# Patient Record
Sex: Female | Born: 1965 | Race: Black or African American | Hispanic: No | Marital: Single | Smoking: Never smoker
Health system: Southern US, Community
[De-identification: ages and names within clinical notes are randomized; demographics above are authoritative.]

## PROBLEM LIST (undated history)

## (undated) DIAGNOSIS — M109 Gout, unspecified: Secondary | ICD-10-CM

## (undated) DIAGNOSIS — E119 Type 2 diabetes mellitus without complications: Secondary | ICD-10-CM

## (undated) DIAGNOSIS — M1990 Unspecified osteoarthritis, unspecified site: Secondary | ICD-10-CM

## (undated) DIAGNOSIS — R002 Palpitations: Secondary | ICD-10-CM

## (undated) DIAGNOSIS — R600 Localized edema: Secondary | ICD-10-CM

## (undated) DIAGNOSIS — M47815 Spondylosis without myelopathy or radiculopathy, thoracolumbar region: Secondary | ICD-10-CM

## (undated) DIAGNOSIS — M479 Spondylosis, unspecified: Secondary | ICD-10-CM

## (undated) DIAGNOSIS — R0602 Shortness of breath: Secondary | ICD-10-CM

## (undated) DIAGNOSIS — E079 Disorder of thyroid, unspecified: Secondary | ICD-10-CM

## (undated) HISTORY — PX: OVARIAN CYST SURGERY: SHX726

## (undated) HISTORY — DX: Palpitations: R00.2

## (undated) HISTORY — PX: UPPER GASTROINTESTINAL ENDOSCOPY: SHX188

## (undated) HISTORY — DX: Unspecified osteoarthritis, unspecified site: M19.90

## (undated) HISTORY — DX: Gout, unspecified: M10.9

## (undated) HISTORY — DX: Shortness of breath: R06.02

## (undated) HISTORY — DX: Spondylosis without myelopathy or radiculopathy, thoracolumbar region: M47.815

## (undated) HISTORY — PX: TONSILECTOMY, ADENOIDECTOMY, BILATERAL MYRINGOTOMY AND TUBES: SHX2538

## (undated) HISTORY — PX: COLONOSCOPY: SHX174

## (undated) HISTORY — PX: TUBAL LIGATION: SHX77

## (undated) HISTORY — DX: Disorder of thyroid, unspecified: E07.9

## (undated) HISTORY — DX: Localized edema: R60.0

## (undated) HISTORY — PX: THYROID SURGERY: SHX805

## (undated) HISTORY — DX: Spondylosis, unspecified: M47.9

## (undated) HISTORY — PX: GALLBLADDER SURGERY: SHX652

## (undated) HISTORY — DX: Type 2 diabetes mellitus without complications: E11.9

---

## 2015-09-17 ENCOUNTER — Encounter: Payer: Self-pay | Admitting: Endocrinology

## 2015-09-17 ENCOUNTER — Ambulatory Visit (INDEPENDENT_AMBULATORY_CARE_PROVIDER_SITE_OTHER): Payer: 59 | Admitting: Endocrinology

## 2015-09-17 VITALS — BP 100/84 | HR 86 | Ht 66.0 in | Wt 271.4 lb

## 2015-09-17 DIAGNOSIS — M1990 Unspecified osteoarthritis, unspecified site: Secondary | ICD-10-CM

## 2015-09-17 DIAGNOSIS — M47819 Spondylosis without myelopathy or radiculopathy, site unspecified: Secondary | ICD-10-CM | POA: Diagnosis not present

## 2015-09-17 DIAGNOSIS — E119 Type 2 diabetes mellitus without complications: Secondary | ICD-10-CM

## 2015-09-17 DIAGNOSIS — E079 Disorder of thyroid, unspecified: Secondary | ICD-10-CM | POA: Diagnosis not present

## 2015-09-17 MED ORDER — METFORMIN HCL ER 500 MG PO TB24: 2000.0000 mg | ORAL_TABLET | Freq: Every day | ORAL | Status: DC

## 2015-09-17 NOTE — Progress Notes (Signed)
Subjective:    Patient ID: Jean HeightLetitia Berry, female    DOB: 01/09/1966, 50 y.o.   MRN: [id number]  HPI pt states DM was dx'ed in 2014; she has mild if any neuropathy of the lower extremities; she is unaware of any associated chronic complications; she has never been on insulin; pt says her diet and exercise are "average;" she has never had GDM, pancreatitis, severe hypoglycemia or DKA.  She has diarrhea.  She says cbg's are well-controlled.   Past Medical History  Diagnosis Date  . Arthritis   . Diabetes mellitus without complication (HCC)   . Osteoarthritis of back   . Gout flare   . Thyroid disease     left lobect approx 2012--benign per pt    Past Surgical History  Procedure Laterality Date  . Tubal ligation    . Upper gastrointestinal endoscopy    . Thyroid surgery Left   . Gallbladder surgery    . Tonsilectomy, adenoidectomy, bilateral myringotomy and tubes    . Colonoscopy    . Ovarian cyst surgery Right     Social History   Social History  . Marital Status: Divorced    Spouse Name: N/A  . Number of Children: N/A  . Years of Education: N/A   Occupational History  . Not on file.   Social History Main Topics  . Smoking status: Never Smoker   . Smokeless tobacco: Not on file  . Alcohol Use: No  . Drug Use: Not on file  . Sexual Activity: Not on file   Other Topics Concern  . Not on file   Social History Narrative  . No narrative on file    No current outpatient prescriptions on file prior to visit.   No current facility-administered medications on file prior to visit.    Not on File  Family History  Problem Relation Age of Onset  . Lung cancer Mother   . Brain cancer Mother   . Heart disease Mother   . Diabetes Maternal Grandmother     BP 100/84 mmHg  Pulse 86  Ht 5\' 6"  (1.676 m)  Wt 271 lb 6.4 oz (123.106 kg)  BMI 43.83 kg/m2  SpO2 98%  Review of Systems denies blurry vision, headache, chest pain, sob, n/v, urinary frequency, muscle  cramps, excessive diaphoresis, depression, cold intolerance, rhinorrhea, and easy bruising.  She has lost weight, due to her efforts. She has leg cramps.     Objective:   Physical Exam VS: see vs page GEN: no distress HEAD: head: no deformity eyes: no periorbital swelling, no proptosis.  external nose and ears are normal mouth: no lesion seen NECK: a healed scar is present.  i do not appreciate a nodule in the thyroid or elsewhere in the neck.  CHEST WALL: no deformity LUNGS: clear to auscultation CV: reg rate and rhythm, no murmur ABD: abdomen is soft, nontender.  no hepatosplenomegaly.  not distended.  no hernia MUSCULOSKELETAL: muscle bulk and strength are grossly normal.  no obvious joint swelling.  gait is normal and steady EXTEMITIES: no deformity.  no ulcer on the feet.  feet are of normal color and temp.  Trace bilat leg edema.  There is bilateral onychomycosis of the toenails PULSES: dorsalis pedis intact bilat.  no carotid bruit NEURO:  cn 2-12 grossly intact.   readily moves all 4's.  sensation is intact to touch on the feet SKIN:  Normal texture and temperature.  No rash or suspicious lesion is visible.   NODES:  None palpable at the neck PSYCH: alert, well-oriented.  Does not appear anxious nor depressed.  I have reviewed outside records, and summarized: Pt was noted to have well-controlled DM, but she requested a weight management program.  She was also noted to have edema      Assessment & Plan:  Type 2 DM: apparently well-controlled.  We have requested results from [facility].   Obesity: new to me.  Patient is advised the following: Patient Instructions  good diet and exercise significantly improve the control of your diabetes.  please let me know if you wish to be referred to a dietician.  high blood sugar is very risky to your health.  you should see an eye doctor and dentist every year.  It is very important to get all recommended vaccinations.  controlling  your blood pressure and cholesterol drastically reduces the damage diabetes does to your body.  Those who smoke should quit.  please discuss these with your doctor.  check your blood sugar once a day.  vary the time of day when you check, between before the 3 meals, and at bedtime.  also check if you have symptoms of your blood sugar being too high or too low.  please keep a record of the readings and bring it to your next appointment here (or you can bring the meter itself).  You can write it on any piece of paper.  please call us sooner if your blood sugar goes below 70, or if you have a lot of readings over 200.  Please consider having weight loss surgery.  It is good for your health.  Here is some information about it.  If you decide to consider further, please call the phone number in the papers, and register for a free informational meeting i have sent a prescription to your pharmacy, to change the metformin to extended-release.   We'll call you when we get the blood test results (a1c).  If you do not hear from [place] in 2 days, please call back.   Please come back for a follow-up appointment in 3 months.    Romero Belling, MD

## 2015-09-17 NOTE — Patient Instructions (Addendum)
good diet and exercise significantly improve the control of your diabetes.  please let me know if you wish to be referred to a dietician.  high blood sugar is very risky to your health.  you should see an eye doctor and dentist every year.  It is very important to get all recommended vaccinations.  controlling your blood pressure and cholesterol drastically reduces the damage diabetes does to your body.  Those who smoke should quit.  please discuss these with your doctor.  check your blood sugar once a day.  vary the time of day when you check, between before the 3 meals, and at bedtime.  also check if you have symptoms of your blood sugar being too high or too low.  please keep a record of the readings and bring it to your next appointment here (or you can bring the meter itself).  You can write it on any piece of paper.  please call us sooner if your blood sugar goes below 70, or if you have a lot of readings over 200.  Please consider having weight loss surgery.  It is good for your health.  Here is some information about it.  If you decide to consider further, please call the phone number in the papers, and register for a free informational meeting i have sent a prescription to your pharmacy, to change the metformin to extended-release.   We'll call you when we get the blood test results (a1c).  If you do not hear from us in 2 days, please call back.   Please come back for a follow-up appointment in 3 months.

## 2015-09-18 DIAGNOSIS — M47816 Spondylosis without myelopathy or radiculopathy, lumbar region: Secondary | ICD-10-CM | POA: Insufficient documentation

## 2015-09-18 DIAGNOSIS — E119 Type 2 diabetes mellitus without complications: Secondary | ICD-10-CM | POA: Insufficient documentation

## 2015-09-18 DIAGNOSIS — M109 Gout, unspecified: Secondary | ICD-10-CM | POA: Insufficient documentation

## 2015-09-18 DIAGNOSIS — M1990 Unspecified osteoarthritis, unspecified site: Secondary | ICD-10-CM | POA: Insufficient documentation

## 2015-09-30 ENCOUNTER — Telehealth: Payer: Self-pay | Admitting: Family

## 2015-09-30 ENCOUNTER — Telehealth: Payer: Self-pay

## 2015-09-30 NOTE — Telephone Encounter (Signed)
Called patient advising her to take the medication 3 times a day with meals, and try to add one more with snacks so it would not upset her stomach. Patient stated she would try that and see if it helped. No other questions or concerns at this time.

## 2015-09-30 NOTE — Telephone Encounter (Signed)
Could you review the note below during Dr. George HughEllison's absence and please advise? Thanks!

## 2015-09-30 NOTE — Telephone Encounter (Signed)
Patient says when she got her Metformin prescription yesterday it says take all four 500mg  tablets with breakfast, so she is confused because she was taking one 1,000mg  morning and night and she explained to the doctor that it was bothering her stomach. Now she will be taking even more.  Please advise.

## 2015-09-30 NOTE — Telephone Encounter (Signed)
Let's try 500 mg with each of the 3 main meals and maybe afterwards add one more with a snack so that it does not bother her stomach.

## 2015-12-19 ENCOUNTER — Ambulatory Visit (INDEPENDENT_AMBULATORY_CARE_PROVIDER_SITE_OTHER): Payer: 59 | Admitting: Endocrinology

## 2015-12-19 ENCOUNTER — Encounter: Payer: Self-pay | Admitting: Endocrinology

## 2015-12-19 VITALS — BP 122/78 | HR 87 | Ht 66.0 in | Wt 279.0 lb

## 2015-12-19 DIAGNOSIS — E119 Type 2 diabetes mellitus without complications: Secondary | ICD-10-CM | POA: Diagnosis not present

## 2015-12-19 NOTE — Progress Notes (Signed)
   Subjective:    Patient ID: Jean HeightLetitia Berry, female    DOB: [birth year], 50 y.o.   MRN: [id number]  HPI Pt returns for f/u of diabetes mellitus: DM type: 2 Dx'ed: 2014 Complications: none Therapy: metformin GDM: never DKA: never Severe hypoglycemia: never Pancreatitis: never Other: she has never been on insulin Interval history: pt states she feels well in general.  She takes metformin as rx'ed.  Diarrhea is resolved Past Medical History:  Diagnosis Date  . Arthritis   . Diabetes mellitus without complication (HCC)   . Gout flare   . Osteoarthritis of back   . Thyroid disease    left lobect approx 2012--benign per pt    Past Surgical History:  Procedure Laterality Date  . COLONOSCOPY    . GALLBLADDER SURGERY    . OVARIAN CYST SURGERY Right   . THYROID SURGERY Left   . TONSILECTOMY, ADENOIDECTOMY, BILATERAL MYRINGOTOMY AND TUBES    . TUBAL LIGATION    . UPPER GASTROINTESTINAL ENDOSCOPY      Social History   Social History  . Marital status: Divorced    Spouse name: N/A  . Number of children: N/A  . Years of education: N/A   Occupational History  . Not on file.   Social History Main Topics  . Smoking status: Never Smoker  . Smokeless tobacco: Not on file  . Alcohol use No  . Drug use: Unknown  . Sexual activity: Not on file   Other Topics Concern  . Not on file   Social History Narrative  . No narrative on file    Current Outpatient Prescriptions on File Prior to Visit  Medication Sig Dispense Refill  . metFORMIN (GLUCOPHAGE-XR) 500 MG 24 hr tablet Take 4 tablets (2,000 mg total) by mouth daily with breakfast. 120 tablet 11  . celecoxib (CELEBREX) 200 MG capsule Take 200 mg by mouth daily.  2   No current facility-administered medications on file prior to visit.     Not on File  Family History  Problem Relation Age of Onset  . Lung cancer Mother   . Brain cancer Mother   . Heart disease Mother   . Diabetes Maternal Grandmother     BP  122/78   Pulse 87   Ht 5\' 6"  (1.676 m)   Wt 279 lb (126.6 kg)   SpO2 96%   BMI 45.03 kg/m   Review of Systems No weight change.     Objective:   Physical Exam VITAL SIGNS:  See vs page GENERAL: no distress Pulses: dorsalis pedis intact bilat.   MSK: no deformity of the feet CV: 1+ bilat leg edema Skin:  no ulcer on the feet.  normal color and temp on the feet. Neuro: sensation is intact to touch on the feet.     (pt says recent a1c was 6%).     Assessment & Plan:  Type 2 DM: apparently well-controlled.  We have requested a1c result

## 2015-12-19 NOTE — Patient Instructions (Addendum)
check your blood sugar once a day.  vary the time of day when you check, between before the 3 meals, and at bedtime.  also check if you have symptoms of your blood sugar being too high or too low.  please keep a record of the readings and bring it to your next appointment here (or you can bring the meter itself).  You can write it on any piece of paper.  please call us sooner if your blood sugar goes below 70, or if you have a lot of readings over 200.   Please continue to pursue the weight loss surgery.   Please continue the same metformin.   We'll call [facility] to obtain the results of your recent blood tests.   Please come back for a follow-up appointment in 4 months.

## 2016-01-22 ENCOUNTER — Ambulatory Visit (INDEPENDENT_AMBULATORY_CARE_PROVIDER_SITE_OTHER): Payer: 59 | Admitting: Sports Medicine

## 2016-01-22 DIAGNOSIS — M545 Low back pain: Secondary | ICD-10-CM | POA: Diagnosis not present

## 2016-01-22 DIAGNOSIS — M5416 Radiculopathy, lumbar region: Secondary | ICD-10-CM

## 2016-01-22 DIAGNOSIS — M25462 Effusion, left knee: Secondary | ICD-10-CM

## 2016-01-22 DIAGNOSIS — M25562 Pain in left knee: Secondary | ICD-10-CM

## 2016-01-22 DIAGNOSIS — M1712 Unilateral primary osteoarthritis, left knee: Secondary | ICD-10-CM | POA: Diagnosis not present

## 2016-01-22 DIAGNOSIS — M5116 Intervertebral disc disorders with radiculopathy, lumbar region: Secondary | ICD-10-CM | POA: Diagnosis not present

## 2016-01-26 ENCOUNTER — Ambulatory Visit
Admission: RE | Admit: 2016-01-26 | Discharge: 2016-01-26 | Disposition: A | Discharge status: Home / Self Care | Payer: 59 | Source: Ambulatory Visit | Attending: Sports Medicine | Admitting: Sports Medicine

## 2016-01-26 ENCOUNTER — Other Ambulatory Visit: Payer: Self-pay | Admitting: Sports Medicine

## 2016-01-26 DIAGNOSIS — M545 Low back pain: Secondary | ICD-10-CM

## 2016-01-27 NOTE — Progress Notes (Signed)
Is there a reason I got this and not you?

## 2016-01-28 ENCOUNTER — Telehealth (INDEPENDENT_AMBULATORY_CARE_PROVIDER_SITE_OTHER): Payer: Self-pay

## 2016-01-28 NOTE — Telephone Encounter (Signed)
-----   Message from Andrena MewsMichael D Rigby, DO sent at 01/27/2016  1:36 PM EDT ----- Please schedule patient a followup visit to discuss these results.

## 2016-01-28 NOTE — Telephone Encounter (Signed)
Talked with patient concerning an appointment for MRI results.  Appointment is scheduled for 02/03/16.

## 2016-02-03 ENCOUNTER — Encounter (INDEPENDENT_AMBULATORY_CARE_PROVIDER_SITE_OTHER): Payer: Self-pay | Admitting: Sports Medicine

## 2016-02-03 ENCOUNTER — Ambulatory Visit (INDEPENDENT_AMBULATORY_CARE_PROVIDER_SITE_OTHER): Payer: 59 | Admitting: Sports Medicine

## 2016-02-03 VITALS — BP 128/77 | HR 91 | Ht 66.0 in

## 2016-02-03 DIAGNOSIS — M4306 Spondylolysis, lumbar region: Secondary | ICD-10-CM

## 2016-02-03 DIAGNOSIS — R937 Abnormal findings on diagnostic imaging of other parts of musculoskeletal system: Secondary | ICD-10-CM

## 2016-02-03 DIAGNOSIS — M5416 Radiculopathy, lumbar region: Secondary | ICD-10-CM

## 2016-02-03 NOTE — Progress Notes (Signed)
Jean HeightLetitia Berry - 50 y.o. female MRN [id number]  Date of birth: December 28, 1965  Office Visit Note: Visit Date: 02/03/2016 PCP: Jean Haywardakia S Starkes, FNP Referred by: Jean HaywardStarkes, Jean S, FNP  Subjective: Chief Complaint  Patient presents with  . Lower Back - Follow-up  . MRI   HPI: Patient is here to discuss MRI results. Currently taking Gabapentin.  Continues to have pain that radiates down into her legs.  Pt denies any change in bowel or bladder habits, muscle weakness, or falls associated with this pain. Denies fevers, chills, recent weight gain or weight loss.  No night sweats. No significant nighttime awakenings due to this issue.      ROS Otherwise per HPI.  Assessment & Plan: Visit Diagnoses:  1. Spondylolysis, lumbar region   2. Lumbar radiculopathy   3. Abnormal musculoskeletal diagnostic imaging     Plan: Findings:  Given findings on MRI will request epidural steroid injection with Dr. Alvester MorinNewton. Continue with gabapentin. Continue HEP:     Meds & Orders: No orders of the defined types were placed in this encounter.   Orders Placed This Encounter  Procedures  . Ambulatory referral to Physical Medicine Rehab    Follow-up: Return in about 6 weeks (around 03/16/2016).   Procedures: No procedures performed  No notes on file   Clinical History: No specialty comments available.  She reports that she has never smoked. She does not have any smokeless tobacco history on file. No results for input(s): HGBA1C, LABURIC in the last 8760 hours.  Objective:  VS:  HT:5\' 6"  (167.6 cm)   WT:   BMI:     BP:128/77  HR:91bpm  TEMP: ( )  RESP:  Physical Exam  Constitutional: She appears well-developed and well-nourished. No distress.  HENT:  Head: Normocephalic and atraumatic.  Pulmonary/Chest: Effort normal. No respiratory distress.  Neurological: She is alert.  Appropriately interactive.  Skin: Skin is warm and dry. No rash noted. She is not diaphoretic. No erythema. No pallor.    Psychiatric: She has a normal mood and affect. Her behavior is normal. Judgment and thought content normal.    Back Exam   Comments:  Pain with straight leg raise on the right, normal on the left. Good internal & external rotation of bilateral hips. Lower extremity sensation is intact to light touch.      Imaging: No results found.  Past Medical/Family/Surgical/Social History: Medications & Allergies reviewed per EMR Patient Active Problem List   Diagnosis Date Noted  . Abnormal musculoskeletal diagnostic imaging 02/03/2016  . Diabetes mellitus without complication (HCC)   . Arthritis   . Osteoarthritis of back   . Gout flare   . Thyroid disease    Past Medical History:  Diagnosis Date  . Arthritis   . Diabetes mellitus without complication (HCC)   . Gout flare   . Osteoarthritis of back   . Thyroid disease    left lobect approx 2012--benign per pt   Family History  Problem Relation Age of Onset  . Lung cancer Mother   . Brain cancer Mother   . Heart disease Mother   . Diabetes Maternal Grandmother    Past Surgical History:  Procedure Laterality Date  . COLONOSCOPY    . GALLBLADDER SURGERY    . OVARIAN CYST SURGERY Right   . THYROID SURGERY Left   . TONSILECTOMY, ADENOIDECTOMY, BILATERAL MYRINGOTOMY AND TUBES    . TUBAL LIGATION    . UPPER GASTROINTESTINAL ENDOSCOPY     Social History  Occupational History  . Not on file.   Social History Main Topics  . Smoking status: Never Smoker  . Smokeless tobacco: Not on file  . Alcohol use No  . Drug use: Unknown  . Sexual activity: Not on file

## 2016-02-16 ENCOUNTER — Encounter (INDEPENDENT_AMBULATORY_CARE_PROVIDER_SITE_OTHER): Payer: 59 | Admitting: Physical Medicine and Rehabilitation

## 2016-02-19 ENCOUNTER — Ambulatory Visit (INDEPENDENT_AMBULATORY_CARE_PROVIDER_SITE_OTHER): Payer: 59 | Admitting: Physical Medicine and Rehabilitation

## 2016-02-19 ENCOUNTER — Encounter (INDEPENDENT_AMBULATORY_CARE_PROVIDER_SITE_OTHER): Payer: Self-pay | Admitting: Physical Medicine and Rehabilitation

## 2016-02-19 VITALS — BP 122/72 | HR 91 | Temp 98.3°F

## 2016-02-19 DIAGNOSIS — M5116 Intervertebral disc disorders with radiculopathy, lumbar region: Secondary | ICD-10-CM

## 2016-02-19 MED ORDER — DEXAMETHASONE SODIUM PHOSPHATE 10 MG/ML IJ SOLN
15.0000 mg | Freq: Once | INTRAMUSCULAR | Status: AC
  Administered 2016-02-19: 15 mg

## 2016-02-19 MED ORDER — LIDOCAINE HCL (PF) 1 % IJ SOLN
0.3300 mL | Freq: Once | INTRAMUSCULAR | Status: AC
  Administered 2016-02-19: 0.3 mL

## 2016-02-19 NOTE — Procedures (Signed)
Lumbosacral Transforaminal Epidural Steroid Injection - Infraneural Approach with Fluoroscopic Guidance  Patient: Jean Berry      Date of Birth: 1965-06-23 MRN: [id number] PCP: Truman Haywardakia S Starkes, FNP      Visit Date: 02/19/2016   Universal Protocol:    Date/Time: 11/16/174:00 PM  Consent Given By: the patient  Position: PRONE   Additional Comments: Vital signs were monitored before and after the procedure. Patient was prepped and draped in the usual sterile fashion. The correct patient, procedure, and site was verified.   Injection Procedure Details:  Procedure Site One Meds Administered:  Meds ordered this encounter  Medications  . lidocaine (PF) (XYLOCAINE) 1 % injection 0.3 mL  . dexamethasone (DECADRON) injection 15 mg      Laterality: Right  Location/Site:  L2-L3  Needle size: 22 G  Needle type: Spinal  Needle Placement: Transforaminal  Findings:  -Contrast Used: 1 mL iohexol 180 mg iodine/mL   -Comments: Excellent flow of contrast along the nerve and into the epidural space.  Procedure Details: After squaring off the end-plates of the desired vertebral level to get a true AP view, the C-arm was obliqued to the painful side so that the superior articulating process is positioned about 1/3 the length of the inferior endplate.  The needle was aimed toward the junction of the superior articular process and the transverse process of the inferior vertebrae. The needle's initial entry is in the lower third of the foramen through Kambin's triangle. The soft tissues overlying this target were infiltrated with 2-3 ml. of 1% Lidocaine without Epinephrine.  The spinal needle was then inserted and advanced toward the target using a "trajectory" view along the fluoroscope beam.  Under AP and lateral visualization, the needle was advanced so it did not puncture dura and did not traverse medially beyond the 6 o'clock position of the pedicle. Bi-planar projections were used to  confirm position. Aspiration was confirmed to be negative for CSF and/or blood. A 1-2 ml. volume of Isovue-250 was injected and flow of contrast was noted at each level. Radiographs were obtained for documentation purposes.   After attaining the desired flow of contrast documented above, a 0.5 to 1.0 ml test dose of 0.25% Marcaine was injected into each respective transforaminal space.  The patient was observed for 90 seconds post injection.  After no sensory deficits were reported, and normal lower extremity motor function was noted,   the above injectate was administered so that equal amounts of the injectate were placed at each foramen (level) into the transforaminal epidural space.   Additional Comments:  The patient tolerated the procedure well Dressing: Band-Aid    Post-procedure details: Patient was observed during the procedure. Post-procedure instructions were reviewed.  Patient left the clinic in stable condition.

## 2016-02-19 NOTE — Patient Instructions (Signed)

## 2016-02-19 NOTE — Progress Notes (Signed)
Jean HeightLetitia Berry - 50 y.o. female MRN [id number]  Date of birth: [birth year]  Office Visit Note: Visit Date: 02/19/2016 PCP: Truman Haywardakia S Starkes, FNP Referred by: Truman HaywardStarkes, Takia S, FNP  Subjective: Chief Complaint  Patient presents with  . Lower Back - Pain   HPI: [name] is a 50 year old female that I have actually seen in the past for epidural injection which was an intralaminar injection at L5-S1. At the time she gets some relief with that. She's gone to follow with Dr. Berline Choughigby for some time with more complaints. MRI did find a somewhat medium-sized foraminal disc herniation on the right at L2. Pain across lower back. Radiates into right hip "stinging pinching type of pain." States it just happens. Doesn't relate it to any certain movement or activity. Denies numbness, tingling, or weakness      ROS Otherwise per HPI.  Assessment & Plan: Visit Diagnoses:  1. Radiculopathy due to lumbar intervertebral disc disorder     Plan: Findings:  I did complete a right L2 transforaminal epidural steroid injection today with good flow as described below.    Meds & Orders:  Meds ordered this encounter  Medications  . lidocaine (PF) (XYLOCAINE) 1 % injection 0.3 mL  . dexamethasone (DECADRON) injection 15 mg    Orders Placed This Encounter  Procedures  . Epidural Steroid injection    Follow-up: Return Dr. Berline Choughigby as scheduled.   Procedures: No procedures performed  Lumbosacral Transforaminal Epidural Steroid Injection - Infraneural Approach with Fluoroscopic Guidance  Patient: Jean HeightLetitia Berry      Date of Birth: [birth year] MRN: [id number] PCP: Truman Haywardakia S Starkes, FNP      Visit Date: 02/19/2016   Universal Protocol:    Date/Time: 11/16/174:00 PM  Consent Given By: the patient  Position: PRONE   Additional Comments: Vital signs were monitored before and after the procedure. Patient was prepped and draped in the usual sterile fashion. The correct patient, procedure, and site was  verified.   Injection Procedure Details:  Procedure Site One Meds Administered:  Meds ordered this encounter  Medications  . lidocaine (PF) (XYLOCAINE) 1 % injection 0.3 mL  . dexamethasone (DECADRON) injection 15 mg      Laterality: Right  Location/Site:  L2-L3  Needle size: 22 G  Needle type: Spinal  Needle Placement: Transforaminal  Findings:  -Contrast Used: 1 mL iohexol 180 mg iodine/mL   -Comments: Excellent flow of contrast along the nerve and into the epidural space.  Procedure Details: After squaring off the end-plates of the desired vertebral level to get a true AP view, the C-arm was obliqued to the painful side so that the superior articulating process is positioned about 1/3 the length of the inferior endplate.  The needle was aimed toward the junction of the superior articular process and the transverse process of the inferior vertebrae. The needle's initial entry is in the lower third of the foramen through Kambin's triangle. The soft tissues overlying this target were infiltrated with 2-3 ml. of 1% Lidocaine without Epinephrine.  The spinal needle was then inserted and advanced toward the target using a "trajectory" view along the fluoroscope beam.  Under AP and lateral visualization, the needle was advanced so it did not puncture dura and did not traverse medially beyond the 6 o'clock position of the pedicle. Bi-planar projections were used to confirm position. Aspiration was confirmed to be negative for CSF and/or blood. A 1-2 ml. volume of Isovue-250 was injected and flow of contrast was noted at each  level. Radiographs were obtained for documentation purposes.   After attaining the desired flow of contrast documented above, a 0.5 to 1.0 ml test dose of 0.25% Marcaine was injected into each respective transforaminal space.  The patient was observed for 90 seconds post injection.  After no sensory deficits were reported, and normal lower extremity motor function was  noted,   the above injectate was administered so that equal amounts of the injectate were placed at each foramen (level) into the transforaminal epidural space.   Additional Comments:  The patient tolerated the procedure well Dressing: Band-Aid    Post-procedure details: Patient was observed during the procedure. Post-procedure instructions were reviewed.  Patient left the clinic in stable condition.     Clinical History: No specialty comments available.  She reports that she has never smoked. She does not have any smokeless tobacco history on file. No results for input(s): HGBA1C, LABURIC in the last 8760 hours.  Objective:  VS:  HT:    WT:   BMI:     BP:122/72  HR:91bpm  TEMP:98.3 F (36.8 C)( )  RESP:98 % Physical Exam  Ortho Exam Imaging: No results found.  Past Medical/Family/Surgical/Social History: Medications & Allergies reviewed per EMR Patient Active Problem List   Diagnosis Date Noted  . Abnormal musculoskeletal diagnostic imaging 02/03/2016  . Diabetes mellitus without complication (HCC)   . Arthritis   . Osteoarthritis of back   . Gout flare   . Thyroid disease    Past Medical History:  Diagnosis Date  . Arthritis   . Diabetes mellitus without complication (HCC)   . Gout flare   . Osteoarthritis of back   . Thyroid disease    left lobect approx 2012--benign per pt   Family History  Problem Relation Age of Onset  . Lung cancer Mother   . Brain cancer Mother   . Heart disease Mother   . Diabetes Maternal Grandmother    Past Surgical History:  Procedure Laterality Date  . COLONOSCOPY    . GALLBLADDER SURGERY    . OVARIAN CYST SURGERY Right   . THYROID SURGERY Left   . TONSILECTOMY, ADENOIDECTOMY, BILATERAL MYRINGOTOMY AND TUBES    . TUBAL LIGATION    . UPPER GASTROINTESTINAL ENDOSCOPY     Social History   Occupational History  . Not on file.   Social History Main Topics  . Smoking status: Never Smoker  . Smokeless tobacco: Not on  file  . Alcohol use No  . Drug use: Unknown  . Sexual activity: Not on file

## 2016-03-16 ENCOUNTER — Ambulatory Visit (INDEPENDENT_AMBULATORY_CARE_PROVIDER_SITE_OTHER): Payer: 59 | Admitting: Sports Medicine

## 2016-04-14 DIAGNOSIS — D649 Anemia, unspecified: Secondary | ICD-10-CM | POA: Diagnosis not present

## 2016-04-14 DIAGNOSIS — Z1231 Encounter for screening mammogram for malignant neoplasm of breast: Secondary | ICD-10-CM | POA: Diagnosis not present

## 2016-04-14 DIAGNOSIS — Z01419 Encounter for gynecological examination (general) (routine) without abnormal findings: Secondary | ICD-10-CM | POA: Diagnosis not present

## 2016-04-19 NOTE — Progress Notes (Deleted)
   Subjective:    Patient ID: Jean HeightLetitia Berry, female    DOB: 12/13/1965, 51 y.o.   MRN: [id number]  HPI Pt returns for f/u of diabetes mellitus: DM type: 2 Dx'ed: 2014 Complications: none Therapy: metformin GDM: never DKA: never Severe hypoglycemia: never Pancreatitis: never Other: she has never been on insulin Interval history: pt states she feels well in general.  She takes metformin as rx'ed.  Diarrhea is resolved   Review of Systems     Objective:   Physical Exam VITAL SIGNS:  See vs page GENERAL: no distress Pulses: dorsalis pedis intact bilat.   MSK: no deformity of the feet CV: 1+ bilat leg edema Skin:  no ulcer on the feet.  normal color and temp on the feet. Neuro: sensation is intact to touch on the feet.         Assessment & Plan:  Type 2 DM: apparently well-controlled.

## 2016-04-20 ENCOUNTER — Ambulatory Visit: Payer: 59 | Admitting: Endocrinology

## 2016-06-17 ENCOUNTER — Telehealth: Payer: Self-pay | Admitting: Sports Medicine

## 2016-06-17 DIAGNOSIS — R937 Abnormal findings on diagnostic imaging of other parts of musculoskeletal system: Secondary | ICD-10-CM

## 2016-06-17 NOTE — Telephone Encounter (Signed)
Patient saw Dr. Berline Choughigby when [facility] and received [facility]. Patient needs to know if Dr. Berline Choughigby needs her to schedule a follow up appointment from MRI with Dr. Alvester MorinNewton or with him. Please call patient at work number [phone] to advise.  IF YOU NEED TO LEAVE A DETAILED MESSAGE PLEASE CALL PATIENT'S MOBILE [phone] AND LEAVE IT THERE. DO NOT LEAVE DETAILED MESSAGE ON WORK NUMBER.

## 2016-06-17 NOTE — Telephone Encounter (Signed)
Patient does need a repeat MRI at this time based on the MRI findings from 01/26/2016.  I would like to follow-up with the patient in clinic myself after the MRI is obtained to discuss the findings as well as reevaluate her general symptoms.

## 2016-06-17 NOTE — Telephone Encounter (Signed)
I reviewed the last few notes on this patient--not sure if you would like to see her or if needs to be referred back to Dr. Alvester MorinNewton? Please advise.

## 2016-06-18 DIAGNOSIS — D649 Anemia, unspecified: Secondary | ICD-10-CM | POA: Diagnosis not present

## 2016-07-04 ENCOUNTER — Other Ambulatory Visit: Payer: 59

## 2016-07-05 ENCOUNTER — Ambulatory Visit
Admission: RE | Admit: 2016-07-05 | Discharge: 2016-07-05 | Disposition: A | Discharge status: Home / Self Care | Payer: 59 | Source: Ambulatory Visit | Attending: Sports Medicine | Admitting: Sports Medicine

## 2016-07-05 DIAGNOSIS — R937 Abnormal findings on diagnostic imaging of other parts of musculoskeletal system: Secondary | ICD-10-CM

## 2016-07-05 DIAGNOSIS — M5126 Other intervertebral disc displacement, lumbar region: Secondary | ICD-10-CM | POA: Diagnosis not present

## 2016-07-09 ENCOUNTER — Encounter: Payer: Self-pay | Admitting: Sports Medicine

## 2016-07-09 ENCOUNTER — Ambulatory Visit (INDEPENDENT_AMBULATORY_CARE_PROVIDER_SITE_OTHER): Payer: 59 | Admitting: Sports Medicine

## 2016-07-09 VITALS — BP 128/72 | HR 82 | Wt 299.6 lb

## 2016-07-09 DIAGNOSIS — R937 Abnormal findings on diagnostic imaging of other parts of musculoskeletal system: Secondary | ICD-10-CM | POA: Diagnosis not present

## 2016-07-09 DIAGNOSIS — E1169 Type 2 diabetes mellitus with other specified complication: Secondary | ICD-10-CM | POA: Diagnosis not present

## 2016-07-09 DIAGNOSIS — M170 Bilateral primary osteoarthritis of knee: Secondary | ICD-10-CM | POA: Insufficient documentation

## 2016-07-09 DIAGNOSIS — M47816 Spondylosis without myelopathy or radiculopathy, lumbar region: Secondary | ICD-10-CM

## 2016-07-09 MED ORDER — MELOXICAM 15 MG PO TABS: 15.0000 mg | ORAL_TABLET | Freq: Every day | ORAL | 1 refills | Status: DC

## 2016-07-09 MED ORDER — DICLOFENAC SODIUM 2 % TD SOLN: 1.0000 "application " | Freq: Two times a day (BID) | TRANSDERMAL | 2 refills | Status: DC

## 2016-07-09 NOTE — Assessment & Plan Note (Signed)
Findings are reassuring.  No further evaluation needed at this time.

## 2016-07-09 NOTE — Assessment & Plan Note (Signed)
Generalized osteo-arthritic bossing on exam.  Will start with topical Pennsaid and therapeutic exercises with physical therapy.  The lack of improvement will need x-rays and therapeutic exercises consideration of injection therapy

## 2016-07-09 NOTE — Progress Notes (Signed)
OFFICE VISIT NOTE Jean Berry. [facility] [phone]  Jean Berry - 51 y.o. female MRN [id number]  Date of birth: [birth year]  Visit Date: 07/09/2016  PCP: Truman Hayward, FNP   Referred by: Truman Hayward, FNP  SUBJECTIVE:   Chief Complaint  Patient presents with  . Review MRI of Lumbar Back   HPI: As above. Additional pertinent information includes:  Overall her back is significantly better than in the past although she continues to have daily nagging pain.  Pain is worse with standing.  No radicular symptoms with walking.  She does report bilateral knee pain that is worse especially with deep flexion on the right.  No significant locking or giving way symptoms but she does have clicking and popping.  She reports general deconditioning with low physical activity level throughout the day.  Pt denies chest pain, palpitations, shortness of breath/DOE, orthopnea/PND, LE swelling.   ROS: ROS  Otherwise per HPI.  HISTORY & PERTINENT PRIOR DATA:  No specialty comments available. She reports that she has never smoked. She has never used smokeless tobacco. No results for input(s): HGBA1C, LABURIC in the last 8760 hours. Medications & Allergies reviewed per EMR Patient Active Problem List   Diagnosis Date Noted  . Primary osteoarthritis of both knees 07/09/2016  . Diabetes mellitus without complication (HCC)   . Arthritis   . Lumbar spondylosis   . Gout flare   . Thyroid disease    Past Medical History:  Diagnosis Date  . Arthritis   . Diabetes mellitus without complication (HCC)   . Gout flare   . Osteoarthritis of back   . Thyroid disease    left lobect approx 2012--benign per pt   Family History  Problem Relation Age of Onset  . Lung cancer Mother   . Brain cancer Mother   . Heart disease Mother   . Diabetes Maternal Grandmother    Past Surgical History:  Procedure Laterality Date  . COLONOSCOPY     . GALLBLADDER SURGERY    . OVARIAN CYST SURGERY Right   . THYROID SURGERY Left   . TONSILECTOMY, ADENOIDECTOMY, BILATERAL MYRINGOTOMY AND TUBES    . TUBAL LIGATION    . UPPER GASTROINTESTINAL ENDOSCOPY     Social History   Occupational History  . Not on file.   Social History Main Topics  . Smoking status: Never Smoker  . Smokeless tobacco: Never Used  . Alcohol use No  . Drug use: No  . Sexual activity: Not on file    OBJECTIVE:  VS:  HT:    WT:299 lb 9.6 oz (135.9 kg)  BMI:     BP:128/72  HR:82bpm  TEMP: ( )  RESP:99 % Physical Exam  Constitutional: She appears well-developed and well-nourished. She is cooperative.  Non-toxic appearance.  HENT:  Head: Normocephalic and atraumatic.  Cardiovascular: Intact distal pulses.   Pulmonary/Chest: No accessory muscle usage. No respiratory distress.  Neurological: She is alert. She is not disoriented. She displays normal reflexes. No sensory deficit.  Skin: Skin is warm, dry and intact. Capillary refill takes less than 2 seconds. No abrasion and no rash noted.  Psychiatric: She has a normal mood and affect. Her speech is normal and behavior is normal. Thought content normal.   Markedly tight hamstrings bilaterally.  No significant midline tenderness.  Pain-free hip rotation and internal and external rotation.  Knees have generalized osteoarthritic bossing with moderate effusion on the right  and small Baker's cyst appreciated.  Ligamentously stable to varus and valgus strain, anterior and posterior drawer.  Negative McMurray's.  [facility] with overall slight valgus alignment and generalized osteophytic bossing over less noticeable than on the right.  Ligamentously stable.  IMAGING & PROCEDURES: Mr Lumbar Spine Wo Contrast  Result Date: 07/05/2016 CLINICAL DATA:  Low back pain. Following up a prior abnormality in the L1 vertebral body. EXAM: MRI LUMBAR SPINE WITHOUT CONTRAST TECHNIQUE: Multiplanar,  multisequence MR imaging of the lumbar spine was performed. No intravenous contrast was administered. COMPARISON:  01/26/2016 FINDINGS: Segmentation: The lowest lumbar type non-rib-bearing vertebra is labeled as L5. Alignment:  2 mm degenerative anterolisthesis at L4-5. Vertebrae: 1.2 cm lesion centrally in the L1 vertebral body is unchanged in appearance from the prior exam. There is high T2 and intermediate T1 signal characteristics and slight speckling internally on T2 weighted images. Appearance strongly favors a mildly atypical hemangioma. Type 2 degenerative endplate findings at L4-5. Disc desiccation at L3-4 and L4-5. Conus medullaris: Extends to the L1 level and appears normal. Paraspinal and other soft tissues: Unremarkable Disc levels: T11-12: Mild left and borderline right foraminal stenosis and borderline central narrowing of the thecal sac due to disc bulge and facet arthropathy. T12-L1: Unremarkable. L1- 2:  Unremarkable. L2-3: Moderate and worsened right foraminal stenosis due to a right foraminal disc protrusion. L3-4: No impingement. Diffuse disc bulge and mild facet and intervertebral spurring. L4-5: Mild displacement of the right L4 nerve in the lateral extraforaminal space with borderline right foraminal stenosis due to disc bulge, disc uncovering, and intervertebral and facet spurring. L5-S1: Mild left foraminal stenosis due to facet and intervertebral spurring. Minimally worsened from prior. IMPRESSION: 1. The L1 vertebral lesion is unchanged. Given the internal speckling on the T2 weighted images, lack of change, an overall appearance I strongly favor this to be an atypical benign hemangioma. 2. Worsened right foraminal impingement at L2-3 due to enlarging right foraminal disc protrusion. 3. Lumbar spondylosis and degenerative disc disease also cause mild impingement at T11-12, L4-5, and L5-S1, as detailed above. Electronically Signed   By: Gaylyn Rong M.D.   On: 07/05/2016 19:58   No  additional findings.   ASSESSMENT & PLAN:  Visit Diagnoses:  1. Abnormal musculoskeletal diagnostic imaging   2. Primary osteoarthritis of both knees   3. Lumbar spondylosis    Meds:  Meds ordered this encounter  Medications  . Diclofenac Sodium (PENNSAID) 2 % SOLN    Sig: Place 1 application onto the skin 2 (two) times daily.    Dispense:  112 g    Refill:  2    Home Phone      [phone] Work Phone      [phone] Mobile          [phone]   . meloxicam (MOBIC) 15 MG tablet    Sig: Take 1 tablet (15 mg total) by mouth daily.    Dispense:  30 tablet    Refill:  1    Orders:  Orders Placed This Encounter  Procedures  . Ambulatory referral to Physical Therapy    Follow-up: Return if symptoms worsen or fail to improve.   Otherwise please see problem oriented charting as below.

## 2016-07-09 NOTE — Assessment & Plan Note (Signed)
Low back is currently stable however she has had persistent pain especially with any type of ambulation or prolonged standing.  No symptoms of neurogenic claudication.  Discussed that the foundation of treatment for this condition is therapeutic exercises and given the patient's current level of deconditioning formal physical therapy recommended.  Order has been placed.

## 2016-07-09 NOTE — Patient Instructions (Signed)
Try using the Pennsaid for both of your knees.  Remember you have the gabapentin if things were to flareup.  Otherwise you can call to get in with Dr. Alvester Morin for another injection.  I'm also referring you to physical therapy for core conditioning and strengthening for your low back.

## 2016-07-12 ENCOUNTER — Other Ambulatory Visit (HOSPITAL_COMMUNITY): Payer: Self-pay | Admitting: General Surgery

## 2016-07-12 DIAGNOSIS — Z6841 Body Mass Index (BMI) 40.0 and over, adult: Principal | ICD-10-CM

## 2016-07-21 ENCOUNTER — Encounter: Payer: Self-pay | Admitting: Physical Therapy

## 2016-07-21 ENCOUNTER — Ambulatory Visit: Payer: 59 | Attending: Sports Medicine | Admitting: Physical Therapy

## 2016-07-21 DIAGNOSIS — G8929 Other chronic pain: Secondary | ICD-10-CM | POA: Insufficient documentation

## 2016-07-21 DIAGNOSIS — M25561 Pain in right knee: Secondary | ICD-10-CM | POA: Insufficient documentation

## 2016-07-21 DIAGNOSIS — R262 Difficulty in walking, not elsewhere classified: Secondary | ICD-10-CM | POA: Insufficient documentation

## 2016-07-21 DIAGNOSIS — M545 Low back pain: Secondary | ICD-10-CM | POA: Diagnosis present

## 2016-07-21 DIAGNOSIS — M25562 Pain in left knee: Secondary | ICD-10-CM | POA: Insufficient documentation

## 2016-07-22 ENCOUNTER — Encounter: Payer: Self-pay | Admitting: Physical Therapy

## 2016-07-22 NOTE — Therapy (Signed)
[facility] [address] [place], [place], [address] Phone: [phone]   Fax:  [phone]  Physical Therapy Evaluation  Patient Details  Name: Jean Berry MRN: [id number] Date of Birth: Nov 16, 1965 Referring Provider: Dr Gaspar Bidding   Encounter Date: 07/21/2016      PT End of Session - 07/21/16 1654    Visit Number 1   Number of Visits 16   Date for PT Re-Evaluation 09/15/16   PT Start Time 1639   PT Stop Time 1726   PT Time Calculation (min) 47 min   Activity Tolerance Patient tolerated treatment well   Behavior During Therapy [facility] for tasks assessed/performed      Past Medical History:  Diagnosis Date  . Arthritis   . Diabetes mellitus without complication (HCC)   . Gout flare   . Osteoarthritis of back   . Thyroid disease    left lobect approx 2012--benign per pt    Past Surgical History:  Procedure Laterality Date  . COLONOSCOPY    . GALLBLADDER SURGERY    . OVARIAN CYST SURGERY Right   . THYROID SURGERY Left   . TONSILECTOMY, ADENOIDECTOMY, BILATERAL MYRINGOTOMY AND TUBES    . TUBAL LIGATION    . UPPER GASTROINTESTINAL ENDOSCOPY      There were no vitals filed for this visit.       Subjective Assessment - 07/22/16 1017    Subjective Patient reports a history of bilateral knee pain and lower back pain. Her right knee hurts worse then the left but her left knee clicks and has crepitus with movement. She has also had increased lower back pain. her lower back pain is worse when she is standing.She sits for long periods at work which makes both her feet swell. She has been to the heart doctor who feels the swelling is not releated.     Pertinent History thyroid surgery; multiple abdominal surgiers    Limitations Standing;Walking;Lifting   How long can you sit comfortably? No limit but is very stiff when she stands up    How long can you stand comfortably? At times < 10 minutes    How long can you walk  comfortably? limited walking distances    Diagnostic tests Back MRI: Mild displacement of L4 nerve root ; Moderate L3-L5 stenosis;    Patient Stated Goals to be able to walk    Currently in Pain? Yes   Pain Score 8    Pain Location Back   Pain Orientation Mid;Lower   Pain Descriptors / Indicators Aching   Pain Type Chronic pain   Pain Onset More than a month ago   Pain Frequency Intermittent   Aggravating Factors  standing, walkin    Pain Relieving Factors rest,    Effect of Pain on Daily Activities difficulty perfroming ADL's    Multiple Pain Sites Yes   Pain Score 5            [facility] PT Assessment - 07/22/16 0001      Assessment   Medical Diagnosis Bilateral knee and back pain   Referring Provider Dr Gaspar Bidding    Onset Date/Surgical Date --  Long standing knee and back pain    Hand Dominance Right   Next MD Visit Nothing schedueled    Prior Therapy Nothing      Precautions   Precautions None     Restrictions   Weight Bearing Restrictions No     Balance Screen   Has the patient  fallen in the past 6 months No   Has the patient had a decrease in activity level because of a fear of falling?  No   Is the patient reluctant to leave their home because of a fear of falling?  No     Home Environment   Additional Comments Has 36 steps to get up to her apoartment.      Prior Function   Level of Independence Independent   Vocation Full time employment   Vocation Requirements Works for [employer]. Sits all day    Leisure would like to go to the gym and walk      Cognition   Overall Cognitive Status Within Functional Limits for tasks assessed   Attention Focused   Focused Attention Appears intact   Memory Appears intact   Awareness Appears intact   Problem Solving Appears intact     Observation/Other Assessments   Observations Sits in forward flexion; swellinginto bilateral feet     Sensation   Light Touch Appears Intact   Additional  Comments Patient reports pain into bilateral hips and buttocks at times.      Coordination   Gross Motor Movements are Fluid and Coordinated Yes   Fine Motor Movements are Fluid and Coordinated Yes     ROM / Strength   AROM / PROM / Strength AROM;PROM;Strength     AROM   Overall AROM Comments pain with right knee flexion; r   AROM Assessment Site Lumbar   Lumbar Flexion 50% limited with pain    Lumbar Extension 25%    Lumbar - Right Rotation 25%    Lumbar - Left Rotation 25%      PROM   PROM Assessment Site Knee   Right/Left Knee Right;Left   Right Knee Extension 0   Right Knee Flexion 106  with pain    Left Knee Extension 0   Left Knee Flexion 118     Strength   Strength Assessment Site Knee;Hip   Right Hip Flexion 4/5   Right Hip ABduction 4/5   Right Hip ADduction 5/5   Left Hip Flexion 4/5   Left Hip ABduction 4/5   Left Hip ADduction 5/5   Right/Left Knee Right;Left   Right Knee Flexion 4+/5   Right Knee Extension 5/5   Left Knee Flexion 4+/5   Left Knee Extension 5/5     Flexibility   Soft Tissue Assessment /Muscle Length yes   Hamstrings signiifcant limitation: L to 30 degrees right to 25 degrees with pain into the lower back.      Palpation   Palpation comment spasming in the mid lumbar paraspinals, Pain in the lateral right knee; pain in the posterior left ankle;      Ambulation/Gait   Gait Comments decreased weight bearing on the right side. lateral movement with gait; decreased hip flexion bilateral. `                   [facility] Adult PT Treatment/Exercise - 07/22/16 0001      Lumbar Exercises: Stretches   Active Hamstring Stretch Limitations seated 3x20sec    Lower Trunk Rotation Limitations 2x10      Knee/Hip Exercises: Supine   Quad Sets Limitations 2x10 with glut set    Other Supine Knee/Hip Exercises ankle pumps 2x10                 PT Education - 07/22/16 1050    Education provided Yes   Education Details  reviewed  symptom mangement, RICE to decrease swelling   Person(s) Educated Patient   Methods Explanation;Demonstration;Tactile cues;Verbal cues   Comprehension Verbalized understanding;Returned demonstration;Verbal cues required;Tactile cues required          PT Short Term Goals - 07/22/16 1414      PT SHORT TERM GOAL #1   Title Patient will demsotrate a good core contraction    Time 4   Period Weeks   Status New     PT SHORT TERM GOAL #2   Title Patient will increase pain free bilateral knee flexion by 10 degrees    Time 4   Period Weeks   Status New     PT SHORT TERM GOAL #3   Title Patient will report 3/10 pain at worst in right knee when standing    Time 4   Period Weeks   Status New     PT SHORT TERM GOAL #4   Title Patient will demonstrate 4+/5 bilateral lower extremity strength    Time 4   Period Weeks   Status New     PT SHORT TERM GOAL #5   Title Patient will be independent with initial HEP    Time 4   Period Weeks   Status New           PT Long Term Goals - 07/22/16 1419      PT LONG TERM GOAL #1   Title Patient will go up and down 8 steps without increased pain with a reciprocol pattern in order to go in and out of her house    Time 8   Period Weeks   Status New     PT LONG TERM GOAL #2   Title Patient will stand for 30 minutes without self report of pain    Time 8   Period Weeks   Status New     PT LONG TERM GOAL #3   Title Patient will be indenedent with an exercise program to improve strength and stability of the legs and spine    Time 8   Period Weeks   Status New               Plan - 07/21/16 1655    Clinical Impression Statement Patient is a 51 year old female with bilateral knee pain R > L and lumbar pain. Her signs and symptoms are consistent with diagnosis of Lumbar spinal stenosis and bilateral knee arthritis. She has swelling in her bilateral lower extremity's that goes into her feet. She reports the swelling is better with rest  and elevation. She was advised to try icing her knees with her rest and elevation. Shoe would benefit from a strethcing and strengthening program for her lower back and knees. She has significant tightness in her hamstrings. She was seen for a low complexity evalution.    Rehab Potential Good   PT Frequency 2x / week   PT Duration 8 weeks   PT Treatment/Interventions ADLs/Self Care Home Management;Cryotherapy;Electrical Stimulation;Iontophoresis /ml Dexamethasone;Gait training;Stair training;Ultrasound;Therapeutic activities;Therapeutic exercise;Neuromuscular re-education;Patient/family education;Passive range of motion;Manual techniques;Taping   PT Next Visit Plan D/C to HEP    PT Home Exercise Plan Reviewed complete HEP and given to the patient for home.    Consulted and Agree with Plan of Care Patient      Patient will benefit from skilled therapeutic intervention in order to improve the following deficits and impairments:  Abnormal gait, Decreased activity tolerance, Pain, Decreased endurance, Decreased range of motion, Difficulty walking,  Increased muscle spasms, Decreased mobility, Decreased strength, Decreased knowledge of use of DME, Impaired sensation  Visit Diagnosis: Chronic pain of right knee - Plan: PT plan of care cert/re-cert  Chronic pain of left knee - Plan: PT plan of care cert/re-cert  Chronic bilateral low back pain without sciatica - Plan: PT plan of care cert/re-cert  Difficulty in walking, not elsewhere classified - Plan: PT plan of care cert/re-cert     Problem List Patient Active Problem List   Diagnosis Date Noted  . Primary osteoarthritis of both knees 07/09/2016  . Diabetes mellitus without complication (HCC)   . Arthritis   . Lumbar spondylosis   . Gout flare   . Thyroid disease     Dessie Coma PT DPT  07/22/2016, 2:27 PM  [facility] [address] [place], [place], [address] Phone:  [phone]   Fax:  [phone]  Name: Dasiah Hooley MRN: [id number] Date of Birth: 04-Apr-1966

## 2016-07-26 ENCOUNTER — Other Ambulatory Visit: Payer: Self-pay

## 2016-07-26 ENCOUNTER — Ambulatory Visit (HOSPITAL_COMMUNITY)
Admission: RE | Admit: 2016-07-26 | Discharge: 2016-07-26 | Disposition: A | Discharge status: Home / Self Care | Payer: 59 | Source: Ambulatory Visit | Attending: General Surgery | Admitting: General Surgery

## 2016-07-26 DIAGNOSIS — R9431 Abnormal electrocardiogram [ECG] [EKG]: Secondary | ICD-10-CM | POA: Insufficient documentation

## 2016-07-26 DIAGNOSIS — Z01810 Encounter for preprocedural cardiovascular examination: Secondary | ICD-10-CM | POA: Insufficient documentation

## 2016-07-26 DIAGNOSIS — Z6841 Body Mass Index (BMI) 40.0 and over, adult: Secondary | ICD-10-CM | POA: Diagnosis not present

## 2016-07-26 DIAGNOSIS — Z01818 Encounter for other preprocedural examination: Secondary | ICD-10-CM | POA: Insufficient documentation

## 2016-07-26 DIAGNOSIS — I252 Old myocardial infarction: Secondary | ICD-10-CM | POA: Diagnosis not present

## 2016-07-26 DIAGNOSIS — Z0389 Encounter for observation for other suspected diseases and conditions ruled out: Secondary | ICD-10-CM | POA: Diagnosis not present

## 2016-07-27 ENCOUNTER — Ambulatory Visit: Payer: 59 | Admitting: Physical Therapy

## 2016-07-27 ENCOUNTER — Encounter: Payer: Self-pay | Admitting: Physical Therapy

## 2016-07-27 DIAGNOSIS — R262 Difficulty in walking, not elsewhere classified: Secondary | ICD-10-CM

## 2016-07-27 DIAGNOSIS — G8929 Other chronic pain: Secondary | ICD-10-CM

## 2016-07-27 DIAGNOSIS — M25562 Pain in left knee: Secondary | ICD-10-CM

## 2016-07-27 DIAGNOSIS — M25561 Pain in right knee: Secondary | ICD-10-CM | POA: Diagnosis not present

## 2016-07-27 DIAGNOSIS — M545 Low back pain: Secondary | ICD-10-CM

## 2016-07-28 ENCOUNTER — Ambulatory Visit: Payer: 59 | Admitting: Physical Therapy

## 2016-07-28 ENCOUNTER — Encounter: Payer: Self-pay | Admitting: Physical Therapy

## 2016-07-28 DIAGNOSIS — G8929 Other chronic pain: Secondary | ICD-10-CM

## 2016-07-28 DIAGNOSIS — M25562 Pain in left knee: Secondary | ICD-10-CM

## 2016-07-28 DIAGNOSIS — M545 Low back pain: Secondary | ICD-10-CM

## 2016-07-28 DIAGNOSIS — M25561 Pain in right knee: Secondary | ICD-10-CM | POA: Diagnosis not present

## 2016-07-28 DIAGNOSIS — R262 Difficulty in walking, not elsewhere classified: Secondary | ICD-10-CM

## 2016-07-28 NOTE — Therapy (Signed)
[facility] [address] [place], [place], [address] Phone: [phone]   Fax:  [phone]  Physical Therapy Treatment  Patient Details  Name: Shaelin Lalley MRN: [id number] Date of Birth: [birth year] Referring Provider: Dr Gaspar Bidding   Encounter Date: 07/28/2016      PT End of Session - 07/28/16 1410    Visit Number 3   Number of Visits 16   Date for PT Re-Evaluation 09/15/16   PT Start Time 1330   PT Stop Time 1424   PT Time Calculation (min) 54 min   Activity Tolerance Patient tolerated treatment well   Behavior During Therapy [facility] for tasks assessed/performed      Past Medical History:  Diagnosis Date  . Arthritis   . Diabetes mellitus without complication (HCC)   . Gout flare   . Osteoarthritis of back   . Thyroid disease    left lobect approx 2012--benign per pt    Past Surgical History:  Procedure Laterality Date  . COLONOSCOPY    . GALLBLADDER SURGERY    . OVARIAN CYST SURGERY Right   . THYROID SURGERY Left   . TONSILECTOMY, ADENOIDECTOMY, BILATERAL MYRINGOTOMY AND TUBES    . TUBAL LIGATION    . UPPER GASTROINTESTINAL ENDOSCOPY      There were no vitals filed for this visit.      Subjective Assessment - 07/28/16 1341    Subjective Patient reports her knees are not sotre after yesterdays visit. She has no complaints today. She feels like she is going up and down her steps a little better.    Pertinent History thyroid surgery; multiple abdominal surgiers    Limitations Standing;Walking;Lifting   How long can you sit comfortably? No limit but is very stiff when she stands up    How long can you stand comfortably? At times < 10 minutes    How long can you walk comfortably? limited walking distances    Diagnostic tests Back MRI: Mild displacement of L4 nerve root ; Moderate L3-L5 stenosis;    Patient Stated Goals to be able to walk    Currently in Pain? No/denies                          [facility] Adult PT Treatment/Exercise - 07/28/16 0001      Lumbar Exercises: Stretches   Active Hamstring Stretch Limitations seated 3x20sec    Lower Trunk Rotation Limitations 2x10      Lumbar Exercises: Supine   Clam Limitations 2x10 red with abdominal brace    Bent Knee Raise Limitations 2x10 with abdominal brace      Knee/Hip Exercises: Supine   Quad Sets Limitations 2x10    Other Supine Knee/Hip Exercises ankle pumps 2x10      Modalities   Modalities Cryotherapy     Cryotherapy   Number Minutes Cryotherapy 10 Minutes  with legs elevated on bolster   Cryotherapy Location Knee   Type of Cryotherapy Ice pack     Manual Therapy   Manual therapy comments manual edema mobilization from distal  lower leg to knee to reduce swelling and pain.                 PT Education - 07/28/16 1407    Education provided Yes   Education Details updated HEP    Person(s) Educated Patient   Methods Explanation;Demonstration;Tactile cues;Verbal cues   Comprehension Returned demonstration;Verbalized understanding;Verbal cues required;Tactile cues required  PT Short Term Goals - 07/22/16 1414      PT SHORT TERM GOAL #1   Title Patient will demsotrate a good core contraction    Time 4   Period Weeks   Status New     PT SHORT TERM GOAL #2   Title Patient will increase pain free bilateral knee flexion by 10 degrees    Time 4   Period Weeks   Status New     PT SHORT TERM GOAL #3   Title Patient will report 3/10 pain at worst in right knee when standing    Time 4   Period Weeks   Status New     PT SHORT TERM GOAL #4   Title Patient will demonstrate 4+/5 bilateral lower extremity strength    Time 4   Period Weeks   Status New     PT SHORT TERM GOAL #5   Title Patient will be independent with initial HEP    Time 4   Period Weeks   Status New           PT Long Term Goals - 07/22/16 1419      PT LONG TERM GOAL #1    Title Patient will go up and down 8 steps without increased pain with a reciprocol pattern in order to go in and out of her house    Time 8   Period Weeks   Status New     PT LONG TERM GOAL #2   Title Patient will stand for 30 minutes without self report of pain    Time 8   Period Weeks   Status New     PT LONG TERM GOAL #3   Title Patient will be indenedent with an exercise program to improve strength and stability of the legs and spine    Time 8   Period Weeks   Status New               Plan - 07/28/16 1423    Clinical Impression Statement Patient is making progress. She had no increase in pain with exercises. therapy aded supine exercises for the hips. Therapy also worked on edema contorl. She appears to have less swelling in her legs.    Rehab Potential Good   PT Frequency 2x / week   PT Duration 8 weeks   PT Treatment/Interventions ADLs/Self Care Home Management;Cryotherapy;Electrical Stimulation;Iontophoresis /ml Dexamethasone;Gait training;Stair training;Ultrasound;Therapeutic activities;Therapeutic exercise;Neuromuscular re-education;Patient/family education;Passive range of motion;Manual techniques;Taping   PT Next Visit Plan cotninue to reviewe stretching and strengthening exercises.    PT Home Exercise Plan Reviewed complete HEP and given to the patient for home.    Consulted and Agree with Plan of Care Patient      Patient will benefit from skilled therapeutic intervention in order to improve the following deficits and impairments:  Abnormal gait, Decreased activity tolerance, Pain, Decreased endurance, Decreased range of motion, Difficulty walking, Increased muscle spasms, Decreased mobility, Decreased strength, Decreased knowledge of use of DME, Impaired sensation  Visit Diagnosis: Chronic pain of right knee  Chronic pain of left knee  Chronic bilateral low back pain without sciatica  Difficulty in walking, not elsewhere classified     Problem  List Patient Active Problem List   Diagnosis Date Noted  . Primary osteoarthritis of both knees 07/09/2016  . Diabetes mellitus without complication (HCC)   . Arthritis   . Lumbar spondylosis   . Gout flare   . Thyroid disease     Onalee Hua  Marquita Palms PT DPT  07/28/2016, 2:26 PM  [facility] [address] [place], [place], [address] Phone: [phone]   Fax:  [phone]  Name: Aika Brzoska MRN: [id number] Date of Birth: [birth year]

## 2016-07-28 NOTE — Therapy (Signed)
[facility] Outpatient Rehabilitation [facility] [address] [place], [place], [address] Phone: [phone]   Fax:  [phone]  Physical Therapy Treatment  Patient Details  Name: Jean Berry MRN: [id number] Date of Birth: 1965/10/03 Referring Provider: Dr Gaspar Bidding   Encounter Date: 07/27/2016      PT End of Session - 07/28/16 1410    Visit Number 3   Number of Visits 16   Date for PT Re-Evaluation 09/15/16   PT Start Time 1330   PT Stop Time 1424   PT Time Calculation (min) 54 min   Activity Tolerance Patient tolerated treatment well   Behavior During Therapy [facility] for tasks assessed/performed      Past Medical History:  Diagnosis Date  . Arthritis   . Diabetes mellitus without complication (HCC)   . Gout flare   . Osteoarthritis of back   . Thyroid disease    left lobect approx 2012--benign per pt    Past Surgical History:  Procedure Laterality Date  . COLONOSCOPY    . GALLBLADDER SURGERY    . OVARIAN CYST SURGERY Right   . THYROID SURGERY Left   . TONSILECTOMY, ADENOIDECTOMY, BILATERAL MYRINGOTOMY AND TUBES    . TUBAL LIGATION    . UPPER GASTROINTESTINAL ENDOSCOPY      There were no vitals filed for this visit.      Subjective Assessment - 07/28/16 1341    Subjective Patient reports her knees are not sotre after yesterdays visit. She has no complaints today. She feels like she is going up and down her steps a little better.    Pertinent History thyroid surgery; multiple abdominal surgiers    Limitations Standing;Walking;Lifting   How long can you sit comfortably? No limit but is very stiff when she stands up    How long can you stand comfortably? At times < 10 minutes    How long can you walk comfortably? limited walking distances    Diagnostic tests Back MRI: Mild displacement of L4 nerve root ; Moderate L3-L5 stenosis;    Patient Stated Goals to be able to walk    Currently in Pain? No/denies                          [facility] Adult PT Treatment/Exercise - 07/28/16 0001      Lumbar Exercises: Stretches   Active Hamstring Stretch Limitations seated 3x20sec    Lower Trunk Rotation Limitations 2x10      Lumbar Exercises: Supine   Clam Limitations 2x10 red with abdominal brace    Bent Knee Raise Limitations 2x10 with abdominal brace      Knee/Hip Exercises: Supine   Quad Sets Limitations 2x10    Other Supine Knee/Hip Exercises ankle pumps 2x10      Modalities   Modalities Cryotherapy     Cryotherapy   Number Minutes Cryotherapy 10 Minutes  with legs elevated on bolster   Cryotherapy Location Knee   Type of Cryotherapy Ice pack     Manual Therapy   Manual therapy comments manual edema mobilization from distal  lower leg to knee to reduce swelling and pain.                 PT Education - 07/28/16 1407    Education provided Yes   Education Details updated HEP    Person(s) Educated Patient   Methods Explanation;Demonstration;Tactile cues;Verbal cues   Comprehension Returned demonstration;Verbalized understanding;Verbal cues required;Tactile cues required  PT Short Term Goals - 07/22/16 1414      PT SHORT TERM GOAL #1   Title Patient will demsotrate a good core contraction    Time 4   Period Weeks   Status New     PT SHORT TERM GOAL #2   Title Patient will increase pain free bilateral knee flexion by 10 degrees    Time 4   Period Weeks   Status New     PT SHORT TERM GOAL #3   Title Patient will report 3/10 pain at worst in right knee when standing    Time 4   Period Weeks   Status New     PT SHORT TERM GOAL #4   Title Patient will demonstrate 4+/5 bilateral lower extremity strength    Time 4   Period Weeks   Status New     PT SHORT TERM GOAL #5   Title Patient will be independent with initial HEP    Time 4   Period Weeks   Status New           PT Long Term Goals - 07/22/16 1419      PT LONG TERM GOAL #1    Title Patient will go up and down 8 steps without increased pain with a reciprocol pattern in order to go in and out of her house    Time 8   Period Weeks   Status New     PT LONG TERM GOAL #2   Title Patient will stand for 30 minutes without self report of pain    Time 8   Period Weeks   Status New     PT LONG TERM GOAL #3   Title Patient will be indenedent with an exercise program to improve strength and stability of the legs and spine    Time 8   Period Weeks   Status New               Plan - 07/28/16 1423    Clinical Impression Statement Patient is making progress. She had no increase in pain with exercises. therapy aded supine exercises for the hips. Therapy also worked on edema contorl. She appears to have less swelling in her legs.    Rehab Potential Good   PT Frequency 2x / week   PT Duration 8 weeks   PT Treatment/Interventions ADLs/Self Care Home Management;Cryotherapy;Electrical Stimulation;Iontophoresis /ml Dexamethasone;Gait training;Stair training;Ultrasound;Therapeutic activities;Therapeutic exercise;Neuromuscular re-education;Patient/family education;Passive range of motion;Manual techniques;Taping   PT Next Visit Plan cotninue to reviewe stretching and strengthening exercises.    PT Home Exercise Plan Reviewed complete HEP and given to the patient for home.    Consulted and Agree with Plan of Care Patient      Patient will benefit from skilled therapeutic intervention in order to improve the following deficits and impairments:  Abnormal gait, Decreased activity tolerance, Pain, Decreased endurance, Decreased range of motion, Difficulty walking, Increased muscle spasms, Decreased mobility, Decreased strength, Decreased knowledge of use of DME, Impaired sensation  Visit Diagnosis: Chronic pain of right knee  Chronic pain of left knee  Chronic bilateral low back pain without sciatica  Difficulty in walking, not elsewhere classified     Problem  List Patient Active Problem List   Diagnosis Date Noted  . Primary osteoarthritis of both knees 07/09/2016  . Diabetes mellitus without complication (HCC)   . Arthritis   . Lumbar spondylosis   . Gout flare   . Thyroid disease     Onalee Hua  Marquita Palms PT DPT  07/28/2016, 2:31 PM  [facility] [address] [place], [place], [address] Phone: [phone]   Fax:  [phone]  Name: Jean Berry MRN: [id number] Date of Birth: 1965-11-09

## 2016-08-02 ENCOUNTER — Ambulatory Visit: Payer: 59 | Admitting: Physical Therapy

## 2016-08-04 ENCOUNTER — Ambulatory Visit: Payer: 59 | Attending: Sports Medicine | Admitting: Physical Therapy

## 2016-08-04 ENCOUNTER — Encounter: Payer: Self-pay | Admitting: Physical Therapy

## 2016-08-04 DIAGNOSIS — M25561 Pain in right knee: Secondary | ICD-10-CM | POA: Insufficient documentation

## 2016-08-04 DIAGNOSIS — R262 Difficulty in walking, not elsewhere classified: Secondary | ICD-10-CM | POA: Diagnosis present

## 2016-08-04 DIAGNOSIS — G8929 Other chronic pain: Secondary | ICD-10-CM | POA: Insufficient documentation

## 2016-08-04 DIAGNOSIS — M545 Low back pain: Secondary | ICD-10-CM | POA: Insufficient documentation

## 2016-08-04 DIAGNOSIS — M25562 Pain in left knee: Secondary | ICD-10-CM | POA: Insufficient documentation

## 2016-08-04 NOTE — Patient Instructions (Signed)
Remove tape if irritating 

## 2016-08-04 NOTE — Therapy (Signed)
[facility] Outpatient Rehabilitation [facility] [address] [place], [place], [address] Phone: [phone]   Fax:  [phone]  Physical Therapy Treatment  Patient Details  Name: Jean Berry MRN: [id number] Date of Birth: [birth year] Referring Provider: Dr Gaspar Bidding   Encounter Date: 08/04/2016      PT End of Session - 08/04/16 1535    Visit Number 4   Number of Visits 16   Date for PT Re-Evaluation 09/15/16   PT Start Time 1503   PT Stop Time 1556   PT Time Calculation (min) 53 min   Activity Tolerance Patient tolerated treatment well   Behavior During Therapy [facility] for tasks assessed/performed      Past Medical History:  Diagnosis Date  . Arthritis   . Diabetes mellitus without complication (HCC)   . Gout flare   . Osteoarthritis of back   . Thyroid disease    left lobect approx 2012--benign per pt    Past Surgical History:  Procedure Laterality Date  . COLONOSCOPY    . GALLBLADDER SURGERY    . OVARIAN CYST SURGERY Right   . THYROID SURGERY Left   . TONSILECTOMY, ADENOIDECTOMY, BILATERAL MYRINGOTOMY AND TUBES    . TUBAL LIGATION    . UPPER GASTROINTESTINAL ENDOSCOPY      There were no vitals filed for this visit.      Subjective Assessment - 08/04/16 1516    Subjective She does her exercises off and on during th day.  Exercises are helping the pain.   Able to walk more at the office. Pelvic tilt hurts    Currently in Pain? Yes   Pain Score 2    Pain Location Knee   Pain Orientation Anterior   Pain Descriptors / Indicators Aching;Sharp  stick through   Pain Frequency Intermittent   Aggravating Factors  steps   Pain Relieving Factors rest exercise            [facility] PT Assessment - 08/04/16 0001      Circumferential Edema   Circumferential - Right 47 cm  mid patella   Circumferential - Left  49 cm  mid patella                     [facility] Adult PT Treatment/Exercise - 08/04/16 0001      Self-Care   Self-Care  Retrograde Massage   Retrograde Massage self massage to decrease edema of LEs; activation of lymph system     Lumbar Exercises: Supine   Other Supine Lumbar Exercises LTR x 10 each side     Knee/Hip Exercises: Aerobic   Nustep L 4 6 minutes      Knee/Hip Exercises: Supine   Quad Sets Limitations 10 with glut sets     Manual Therapy   Manual Therapy Taping   Manual therapy comments manual edema mobilization from distal  lower leg to knee to reduce swelling and pain.   legs elevated, tissue softened   Kinesiotex Edema     Kinesiotix   Edema 2 fans medial lateral distal thigh                PT Education - 08/04/16 1535    Education provided Yes   Education Details edema management   Person(s) Educated Patient   Methods Explanation;Demonstration   Comprehension Verbalized understanding          PT Short Term Goals - 08/04/16 1801      PT SHORT TERM GOAL #1  Title Patient will demsotrate a good core contraction    Time 4   Period Weeks   Status Unable to assess     PT SHORT TERM GOAL #2   Title Patient will increase pain free bilateral knee flexion by 10 degrees    Time 4   Period Weeks   Status Unable to assess     PT SHORT TERM GOAL #3   Title Patient will report 3/10 pain at worst in right knee when standing    Baseline varies   Time 4   Period Weeks   Status On-going     PT SHORT TERM GOAL #4   Title Patient will demonstrate 4+/5 bilateral lower extremity strength    Time 4   Period Weeks   Status Unable to assess     PT SHORT TERM GOAL #5   Title Patient will be independent with initial HEP    Baseline independent with exercises issued so far   Time 4   Period Weeks   Status On-going           PT Long Term Goals - 07/22/16 1419      PT LONG TERM GOAL #1   Title Patient will go up and down 8 steps without increased pain with a reciprocol pattern in order to go in and out of her house    Time 8   Period Weeks   Status New     PT  LONG TERM GOAL #2   Title Patient will stand for 30 minutes without self report of pain    Time 8   Period Weeks   Status New     PT LONG TERM GOAL #3   Title Patient will be indenedent with an exercise program to improve strength and stability of the legs and spine    Time 8   Period Weeks   Status New               Plan - 08/04/16 1759    Clinical Impression Statement Focus today on edema reduction legs,  Girth larger left then right mid patella.  See flow sheet.  Tissue softened and patient had less discomfort.     PT Next Visit Plan cotninue to reviewe stretching and strengthening exercises. Edema work, assess tape   PT Home Exercise Plan Reviewed complete HEP and given to the patient for home.    Consulted and Agree with Plan of Care Patient      Patient will benefit from skilled therapeutic intervention in order to improve the following deficits and impairments:  Abnormal gait, Decreased activity tolerance, Pain, Decreased endurance, Decreased range of motion, Difficulty walking, Increased muscle spasms, Decreased mobility, Decreased strength, Decreased knowledge of use of DME, Impaired sensation  Visit Diagnosis: Chronic pain of right knee  Chronic pain of left knee  Chronic bilateral low back pain without sciatica  Difficulty in walking, not elsewhere classified     Problem List Patient Active Problem List   Diagnosis Date Noted  . Primary osteoarthritis of both knees 07/09/2016  . Diabetes mellitus without complication (HCC)   . Arthritis   . Lumbar spondylosis   . Gout flare   . Thyroid disease     Jachelle Fluty PTA 08/04/2016, 6:03 PM  [facility] [address] [place], [place], [address] Phone: [phone]   Fax:  [phone]  Name: Jean Berry MRN: [id number] Date of Birth: January 22, 1966

## 2016-08-10 ENCOUNTER — Encounter: Payer: Self-pay | Admitting: Physical Therapy

## 2016-08-10 ENCOUNTER — Ambulatory Visit: Payer: 59 | Admitting: Physical Therapy

## 2016-08-10 DIAGNOSIS — G8929 Other chronic pain: Secondary | ICD-10-CM

## 2016-08-10 DIAGNOSIS — R262 Difficulty in walking, not elsewhere classified: Secondary | ICD-10-CM

## 2016-08-10 DIAGNOSIS — M25561 Pain in right knee: Secondary | ICD-10-CM | POA: Diagnosis not present

## 2016-08-10 DIAGNOSIS — M25562 Pain in left knee: Secondary | ICD-10-CM

## 2016-08-10 NOTE — Patient Instructions (Signed)

## 2016-08-11 NOTE — Therapy (Signed)
[facility] Outpatient Rehabilitation [facility] [address] [place], [place], [address] Phone: [phone]   Fax:  [phone]  Physical Therapy Treatment  Patient Details  Name: Jean Berry MRN: [id number] Date of Birth: [birth year] Referring Provider: Dr Gaspar Bidding   Encounter Date: 08/10/2016    Past Medical History:  Diagnosis Date  . Arthritis   . Diabetes mellitus without complication (HCC)   . Gout flare   . Osteoarthritis of back   . Thyroid disease    left lobect approx 2012--benign per pt    Past Surgical History:  Procedure Laterality Date  . COLONOSCOPY    . GALLBLADDER SURGERY    . OVARIAN CYST SURGERY Right   . THYROID SURGERY Left   . TONSILECTOMY, ADENOIDECTOMY, BILATERAL MYRINGOTOMY AND TUBES    . TUBAL LIGATION    . UPPER GASTROINTESTINAL ENDOSCOPY      There were no vitals filed for this visit.      Subjective Assessment - 08/10/16 1508    Subjective She has changed her diet.  She is doing her exercises and is more active today. Back and knees are feeling better.   Using good posture through out ht e day.   Currently in Pain? No/denies   Pain Score 3   at the  worst   Pain Location Knee  behind the knee cap   Pain Orientation Right   Pain Descriptors / Indicators Constant   Pain Frequency Intermittent   Aggravating Factors  steps   Pain Relieving Factors exercise      Patient exercises focused on core and leg strengthening and ROM. Taping continued for edema Self care consisted of ADL insstruction. Patient was able to get a good abdominal contraction today.                   [facility] Adult PT Treatment/Exercise - 08/11/16 0001      Manual Therapy   Manual therapy comments --  legs elevated, tissue softened                PT Education - 08/10/16 1751    Education provided Yes   Education Details ADL   Person(s) Educated Patient   Methods Explanation;Demonstration   Comprehension  Verbalized understanding          PT Short Term Goals - 08/04/16 1801      PT SHORT TERM GOAL #1   Title Patient will demsotrate a good core contraction    Time 4   Period Weeks   Status Unable to assess     PT SHORT TERM GOAL #2   Title Patient will increase pain free bilateral knee flexion by 10 degrees    Time 4   Period Weeks   Status Unable to assess     PT SHORT TERM GOAL #3   Title Patient will report 3/10 pain at worst in right knee when standing    Baseline varies   Time 4   Period Weeks   Status On-going     PT SHORT TERM GOAL #4   Title Patient will demonstrate 4+/5 bilateral lower extremity strength    Time 4   Period Weeks   Status Unable to assess     PT SHORT TERM GOAL #5   Title Patient will be independent with initial HEP    Baseline independent with exercises issued so far   Time 4   Period Weeks   Status On-going  PT Long Term Goals - 07/22/16 1419      PT LONG TERM GOAL #1   Title Patient will go up and down 8 steps without increased pain with a reciprocol pattern in order to go in and out of her house    Time 8   Period Weeks   Status New     PT LONG TERM GOAL #2   Title Patient will stand for 30 minutes without self report of pain    Time 8   Period Weeks   Status New     PT LONG TERM GOAL #3   Title Patient will be indenedent with an exercise program to improve strength and stability of the legs and spine    Time 8   Period Weeks   Status New               Plan - 08/10/16 1752    Clinical Impression Statement No pain today.  Education for ADFL's and taping and strengthening Focus.  105 AROM flexion.  Edema visabilly improved.   Worst pain right knee now 3/10 .   PT Next Visit Plan cotninue to reviewe stretching and strengthening exercises. Edema work, assess tape   PT Home Exercise Plan Reviewed complete HEP and given to the patient for home.    Consulted and Agree with Plan of Care Patient      Patient  will benefit from skilled therapeutic intervention in order to improve the following deficits and impairments:  Abnormal gait, Decreased activity tolerance, Pain, Decreased endurance, Decreased range of motion, Difficulty walking, Increased muscle spasms, Decreased mobility, Decreased strength, Decreased knowledge of use of DME, Impaired sensation  Visit Diagnosis: Chronic pain of right knee  Chronic pain of left knee  Difficulty in walking, not elsewhere classified     Problem List Patient Active Problem List   Diagnosis Date Noted  . Primary osteoarthritis of both knees 07/09/2016  . Diabetes mellitus without complication (HCC)   . Arthritis   . Lumbar spondylosis   . Gout flare   . Thyroid disease     HARRIS,KAREN PTA 08/11/2016, 5:55 PM  [facility] [address] [place], [place], [id number] Phone: [phone]   Fax:  [phone]  Name: Jean Berry MRN: [id number] Date of Birth: [birth year]

## 2016-08-12 ENCOUNTER — Ambulatory Visit: Payer: 59 | Admitting: Physical Therapy

## 2016-08-12 DIAGNOSIS — M545 Low back pain, unspecified: Secondary | ICD-10-CM

## 2016-08-12 DIAGNOSIS — M25562 Pain in left knee: Secondary | ICD-10-CM

## 2016-08-12 DIAGNOSIS — R262 Difficulty in walking, not elsewhere classified: Secondary | ICD-10-CM

## 2016-08-12 DIAGNOSIS — M25561 Pain in right knee: Secondary | ICD-10-CM | POA: Diagnosis not present

## 2016-08-12 DIAGNOSIS — G8929 Other chronic pain: Secondary | ICD-10-CM

## 2016-08-12 NOTE — Therapy (Signed)
[facility] [address] [place], [place], [address] Phone: [phone]   Fax:  [phone]  Physical Therapy Treatment  Patient Details  Name: Jean Berry MRN: [id number] Date of Birth: February 25, 1966 Referring Provider: Dr Gaspar BiddingMichael Rigby   Encounter Date: 08/12/2016      PT End of Session - 08/12/16 1554    Visit Number 6   Number of Visits 16   Date for PT Re-Evaluation 09/15/16   PT Start Time 1548   PT Stop Time 1630   PT Time Calculation (min) 42 min   Activity Tolerance Patient tolerated treatment well   Behavior During Therapy [facility] for tasks assessed/performed      Past Medical History:  Diagnosis Date  . Arthritis   . Diabetes mellitus without complication (HCC)   . Gout flare   . Osteoarthritis of back   . Thyroid disease    left lobect approx 2012--benign per pt    Past Surgical History:  Procedure Laterality Date  . COLONOSCOPY    . GALLBLADDER SURGERY    . OVARIAN CYST SURGERY Right   . THYROID SURGERY Left   . TONSILECTOMY, ADENOIDECTOMY, BILATERAL MYRINGOTOMY AND TUBES    . TUBAL LIGATION    . UPPER GASTROINTESTINAL ENDOSCOPY      There were no vitals filed for this visit.      Subjective Assessment - 08/12/16 1556    Subjective Patient reports she has been walking at home. She is not having much pain at all. She is doing her exercises.    Pertinent History thyroid surgery; multiple abdominal surgiers    Limitations Standing;Walking;Lifting   How long can you sit comfortably? No limit but is very stiff when she stands up    How long can you stand comfortably? At times < 10 minutes    How long can you walk comfortably? limited walking distances    Diagnostic tests Back MRI: Mild displacement of L4 nerve root ; Moderate L3-L5 stenosis;    Patient Stated Goals to be able to walk    Currently in Pain? No/denies                         [facility]/Exercise -  08/12/16 0001      Lumbar Exercises: Supine   Bridge 10 reps   Other Supine Lumbar Exercises LTR x 10 each side     Knee/Hip Exercises: Aerobic   Nustep L 4 6 minutes      Knee/Hip Exercises: Standing   Heel Raises Limitations 2x10    Hip Flexion Limitations standing march 2x10    Abduction Limitations x10    Extension Limitations x10    Functional Squat Limitations in low range 2x10      Knee/Hip Exercises: Supine   Bridges Limitations 2x10   Other Supine Knee/Hip Exercises supine hip flexion 2x10     Manual Therapy   Manual therapy comments edemaq mobilization to bilateral mobilization   Kinesiotex Edema                PT Education - 08/12/16 1555    Education provided Yes   Education Details continue with strengthning    Person(s) Educated Patient   Methods Explanation;Demonstration   Comprehension Verbalized understanding;Returned demonstration          PT Short Term Goals - 08/12/16 1703      PT SHORT TERM GOAL #1   Title Patient will demsotrate a good  core contraction    Time 4   Period Weeks   Status On-going     PT SHORT TERM GOAL #2   Title Patient will increase pain free bilateral knee flexion by 10 degrees    Time 4   Period Weeks   Status On-going     PT SHORT TERM GOAL #3   Title Patient will report 3/10 pain at worst in right knee when standing    Baseline varies   Time 4   Period Weeks   Status On-going     PT SHORT TERM GOAL #4   Title Patient will demonstrate 4+/5 bilateral lower extremity strength    Time 4   Period Weeks   Status On-going     PT SHORT TERM GOAL #5   Title Patient will be independent with initial HEP    Baseline independent with exercises issued so far   Time 4   Period Weeks   Status On-going           PT Long Term Goals - 07/22/16 1419      PT LONG TERM GOAL #1   Title Patient will go up and down 8 steps without increased pain with a reciprocol pattern in order to go in and out of her house     Time 8   Period Weeks   Status New     PT LONG TERM GOAL #2   Title Patient will stand for 30 minutes without self report of pain    Time 8   Period Weeks   Status New     PT LONG TERM GOAL #3   Title Patient will be indenedent with an exercise program to improve strength and stability of the legs and spine    Time 8   Period Weeks   Status New               Plan - 08/12/16 1558    Clinical Impression Statement Patient tolerated treatment well. Therapy gave her standing exercises. Therapy reviewed symptom mangement. Patients swelling appears to be improved.    Rehab Potential Good   PT Frequency 2x / week   PT Duration 8 weeks   PT Treatment/Interventions ADLs/Self Care Home Management;Cryotherapy;Electrical Stimulation;Iontophoresis 4mg /ml Dexamethasone;Gait training;Stair training;Ultrasound;Therapeutic activities;Therapeutic exercise;Neuromuscular re-education;Patient/family education;Passive range of motion;Manual techniques;Taping   PT Next Visit Plan cotninue to reviewe stretching and strengthening exercises. Edema work, assess tape   PT Home Exercise Plan Reviewed complete HEP and given to the patient for home.       Patient will benefit from skilled therapeutic intervention in order to improve the following deficits and impairments:  Abnormal gait, Decreased activity tolerance, Pain, Decreased endurance, Decreased range of motion, Difficulty walking, Increased muscle spasms, Decreased mobility, Decreased strength, Decreased knowledge of use of DME, Impaired sensation  Visit Diagnosis: Chronic pain of right knee  Chronic pain of left knee  Difficulty in walking, not elsewhere classified  Chronic bilateral low back pain without sciatica     Problem List Patient Active Problem List   Diagnosis Date Noted  . Primary osteoarthritis of both knees 07/09/2016  . Diabetes mellitus without complication (HCC)   . Arthritis   . Lumbar spondylosis   . Gout flare    . Thyroid disease     Dessie Coma PT DPT  08/12/2016, 5:07 PM  [facility] [address] [place], [place], [address] Phone: [phone]   Fax:  [phone]  Name: Jean Berry MRN:  [id number] Date of Birth: [birth year]

## 2016-08-16 ENCOUNTER — Encounter: Payer: Self-pay | Admitting: Physical Therapy

## 2016-08-16 ENCOUNTER — Ambulatory Visit: Payer: 59 | Admitting: Physical Therapy

## 2016-08-16 DIAGNOSIS — M25561 Pain in right knee: Secondary | ICD-10-CM | POA: Diagnosis not present

## 2016-08-16 DIAGNOSIS — R262 Difficulty in walking, not elsewhere classified: Secondary | ICD-10-CM

## 2016-08-16 DIAGNOSIS — M545 Low back pain: Secondary | ICD-10-CM

## 2016-08-16 DIAGNOSIS — M25562 Pain in left knee: Secondary | ICD-10-CM

## 2016-08-16 DIAGNOSIS — G8929 Other chronic pain: Secondary | ICD-10-CM

## 2016-08-18 ENCOUNTER — Ambulatory Visit: Payer: 59 | Admitting: Physical Therapy

## 2016-08-24 ENCOUNTER — Ambulatory Visit: Payer: 59 | Admitting: Physical Therapy

## 2016-08-25 ENCOUNTER — Ambulatory Visit: Payer: 59 | Admitting: Physical Therapy

## 2016-09-07 NOTE — Therapy (Addendum)
[place], [place], [address] Phone: [phone]   Fax:  [phone]  Physical Therapy Treatment/ Discharge   Patient Details  Name: Jean Berry MRN: [id number] Date of Birth: 04/05/1966 Referring Provider: Dr Teresa Coombs   Encounter Date: 08/16/2016    Past Medical History:  Diagnosis Date  . Arthritis   . Diabetes mellitus without complication ([place])   . Gout flare   . Osteoarthritis of back   . Thyroid disease    left lobect approx 2012--benign per pt    Past Surgical History:  Procedure Laterality Date  . COLONOSCOPY    . GALLBLADDER SURGERY    . OVARIAN CYST SURGERY Right   . THYROID SURGERY Left   . TONSILECTOMY, ADENOIDECTOMY, BILATERAL MYRINGOTOMY AND TUBES    . TUBAL LIGATION    . UPPER GASTROINTESTINAL ENDOSCOPY      There were no vitals filed for this visit.      Subjective Assessment - 09/07/16 0806    Subjective Patient reports that her left knee has been a little sore after the last visit. She drove to [place] after work.    Pertinent History thyroid surgery; multiple abdominal surgiers    Limitations Standing;Walking;Lifting   How long can you sit comfortably? No limit but is very stiff when she stands up    How long can you stand comfortably? At times < 10 minutes    How long can you walk comfortably? limited walking distances    Diagnostic tests Back MRI: Mild displacement of L4 nerve root ; Moderate L3-L5 stenosis;    Currently in Pain? Yes   Pain Score 1    Pain Location Knee   Pain Orientation Left   Pain Descriptors / Indicators Aching   Pain Type Chronic pain   Pain Onset More than a month ago   Pain Frequency Intermittent   Aggravating Factors  steps    Pain Relieving Factors exercises    Effect of Pain on Daily Activities difficulty perfroming ADL's                          [facility] Adult PT Treatment/Exercise - 09/07/16 0001      Lumbar Exercises: Stretches   Active Hamstring Stretch Limitations seated 3x20sec      Lumbar Exercises: Supine   Bridge Limitations 2x10    Other Supine Lumbar Exercises x10      Knee/Hip Exercises: Aerobic   Nustep L 4 5 minutes      Knee/Hip Exercises: Standing   Heel Raises Limitations 2x10    Hip Flexion Limitations standing march 2x10    Abduction Limitations x10    Extension Limitations x10    Functional Squat Limitations in low range 2x10      Knee/Hip Exercises: Supine   Bridges Limitations 2x10   Other Supine Knee/Hip Exercises supine hip flexion 2x10                  PT Short Term Goals - 09/07/16 0805      PT SHORT TERM GOAL #1   Title Patient will demsotrate a good core contraction    Baseline improving    Time 4   Period Weeks   Status On-going     PT SHORT TERM GOAL #2   Title Patient will increase pain free bilateral knee flexion by 10 degrees    Baseline minor pain with end range left flexion  Time 4   Period Weeks   Status On-going     PT SHORT TERM GOAL #3   Title Patient will report 3/10 pain at worst in right knee when standing    Baseline varies   Time 4   Period Weeks   Status On-going     PT SHORT TERM GOAL #4   Title Patient will demonstrate 4+/5 bilateral lower extremity strength    Time 4   Period Weeks   Status On-going     PT SHORT TERM GOAL #5   Title Patient will be independent with initial HEP    Baseline independent with exercises issued so far   Time 4   Period Weeks   Status On-going           PT Long Term Goals - 07/22/16 1419      PT LONG TERM GOAL #1   Title Patient will go up and down 8 steps without increased pain with a reciprocol pattern in order to go in and out of her house    Time 8   Period Weeks   Status New     PT LONG TERM GOAL #2   Title Patient will stand for 30 minutes without self report of pain    Time 8   Period Weeks   Status New     PT LONG TERM GOAL #3   Title Patient  will be indenedent with an exercise program to improve strength and stability of the legs and spine    Time 8   Period Weeks   Status New               Plan - 09/07/16 0805    Clinical Impression Statement Patient tolerated treatment well. Therapy added in hamstring strethcing. She had no increased pain. She was encouraged to elevate and ice if needed if she is sore after the visit today.    Rehab Potential Good   PT Frequency 2x / week   PT Duration 8 weeks   PT Treatment/Interventions ADLs/Self Care Home Management;Cryotherapy;Electrical Stimulation;Iontophoresis 71m/ml Dexamethasone;Gait training;Stair training;Ultrasound;Therapeutic activities;Therapeutic exercise;Neuromuscular re-education;Patient/family education;Passive range of motion;Manual techniques;Taping   PT Next Visit Plan cotninue to reviewe stretching and strengthening exercises. Edema work, assess tape   PT Home Exercise Plan Reviewed complete HEP and given to the patient for home.    Consulted and Agree with Plan of Care Patient      Patient will benefit from skilled therapeutic intervention in order to improve the following deficits and impairments:  Abnormal gait, Decreased activity tolerance, Pain, Decreased endurance, Decreased range of motion, Difficulty walking, Increased muscle spasms, Decreased mobility, Decreased strength, Decreased knowledge of use of DME, Impaired sensation  Visit Diagnosis: Chronic pain of right knee  Chronic pain of left knee  Difficulty in walking, not elsewhere classified  Chronic bilateral low back pain without sciatica    PHYSICAL THERAPY DISCHARGE SUMMARY  Visits from Start of Care: 5  Current functional level related to goals / functional outcomes: improved pain    Remaining deficits: Improved pain    Education / Equipment: Improved pain  Plan: Patient agrees to discharge.  Patient goals were not met. Patient is being discharged due to meeting the stated rehab  goals.  ?????      Problem List Patient Active Problem List   Diagnosis Date Noted  . Primary osteoarthritis of both knees 07/09/2016  . [place]   . Arthritis   . Lumbar spondylosis   .  Gout flare   . Thyroid disease     Carney Living PT DPT  09/07/2016, 8:08 AM  [facility] [address] [place], [place], [address] Phone: [phone]   Fax:  [phone]  Name: Jean Berry MRN: [id number] Date of Birth: [birth year]

## 2016-09-17 DIAGNOSIS — R7303 Prediabetes: Secondary | ICD-10-CM | POA: Diagnosis not present

## 2016-09-17 DIAGNOSIS — E119 Type 2 diabetes mellitus without complications: Secondary | ICD-10-CM | POA: Diagnosis not present

## 2016-09-27 DIAGNOSIS — E1169 Type 2 diabetes mellitus with other specified complication: Secondary | ICD-10-CM | POA: Diagnosis not present

## 2016-09-28 ENCOUNTER — Other Ambulatory Visit: Payer: Self-pay | Admitting: Endocrinology

## 2016-09-28 NOTE — Telephone Encounter (Signed)
Last seen 12/19/15, no showed last appointment, okay to refill? Thank you!

## 2016-09-28 NOTE — Telephone Encounter (Signed)
Please refill x 3 mos Ov is due 

## 2016-10-11 DIAGNOSIS — R002 Palpitations: Secondary | ICD-10-CM | POA: Diagnosis not present

## 2016-10-11 DIAGNOSIS — M549 Dorsalgia, unspecified: Secondary | ICD-10-CM | POA: Diagnosis not present

## 2016-10-11 DIAGNOSIS — R000 Tachycardia, unspecified: Secondary | ICD-10-CM | POA: Diagnosis not present

## 2016-10-21 ENCOUNTER — Telehealth: Payer: Self-pay | Admitting: Cardiovascular Disease

## 2016-10-21 NOTE — Telephone Encounter (Signed)
Received records from [facility] for appointment on 11/18/16 with Dr Duke Salviaandolph.  Records put with Dr Leonides Sakeandolph's schedule for 11/18/16. lp

## 2016-11-04 DIAGNOSIS — M25561 Pain in right knee: Secondary | ICD-10-CM | POA: Diagnosis not present

## 2016-11-04 DIAGNOSIS — M25562 Pain in left knee: Secondary | ICD-10-CM | POA: Diagnosis not present

## 2016-11-04 DIAGNOSIS — M170 Bilateral primary osteoarthritis of knee: Secondary | ICD-10-CM | POA: Diagnosis not present

## 2016-11-15 DIAGNOSIS — R000 Tachycardia, unspecified: Secondary | ICD-10-CM | POA: Insufficient documentation

## 2016-11-15 DIAGNOSIS — R002 Palpitations: Secondary | ICD-10-CM | POA: Insufficient documentation

## 2016-11-15 DIAGNOSIS — M549 Dorsalgia, unspecified: Secondary | ICD-10-CM | POA: Insufficient documentation

## 2016-11-18 ENCOUNTER — Encounter: Payer: Self-pay | Admitting: Cardiovascular Disease

## 2016-11-18 ENCOUNTER — Ambulatory Visit (INDEPENDENT_AMBULATORY_CARE_PROVIDER_SITE_OTHER): Payer: 59 | Admitting: Cardiovascular Disease

## 2016-11-18 VITALS — BP 121/76 | HR 91 | Ht 66.0 in | Wt 294.6 lb

## 2016-11-18 DIAGNOSIS — R0602 Shortness of breath: Secondary | ICD-10-CM

## 2016-11-18 DIAGNOSIS — R600 Localized edema: Secondary | ICD-10-CM

## 2016-11-18 DIAGNOSIS — R002 Palpitations: Secondary | ICD-10-CM | POA: Diagnosis not present

## 2016-11-18 HISTORY — DX: Shortness of breath: R06.02

## 2016-11-18 HISTORY — DX: Localized edema: R60.0

## 2016-11-18 NOTE — Patient Instructions (Signed)
Medication Instructions:  Your physician recommends that you continue on your current medications as directed. Please refer to the Current Medication list given to you today.  Labwork: NONE  Testing/Procedures: Your physician has requested that you have an echocardiogram. Echocardiography is a painless test that uses sound waves to create images of your heart. It provides your doctor with information about the size and shape of your heart and how well your heart's chambers and valves are working. This procedure takes approximately one hour. There are no restrictions for this procedure.  [facility] AT [address]  Your physician has recommended that you wear a holter monitor. Holter monitors are medical devices that record the heart's electrical activity. Doctors most often use these monitors to diagnose arrhythmias. Arrhythmias are problems with the speed or rhythm of the heartbeat. The monitor is a small, portable device. You can wear one while you do your normal daily activities. This is usually used to diagnose what is causing palpitations/syncope (passing out). 48 HOUR  [facility] AT [address]  A chest x-ray takes a picture of the organs and structures inside the chest, including the heart, lungs, and blood vessels. This test can show several things, including, whether the heart is enlarges; whether fluid is building up in the lungs; and whether pacemaker / defibrillator leads are still in place.  VQ SCAN   Follow-Up: Your physician recommends that you schedule a follow-up appointment in: 6 WEEKS   If you need a refill on your cardiac medications before your next appointment, please call your pharmacy.  Ventilation-Perfusion Scan A ventilation-perfusion scan is a scan to look at the airflow (ventilation) and blood flow (perfusion) in your lungs. It is most often used to look for blood clots that may have traveled to your lungs. During this scan,  radioactive compounds are injected into your body or are breathed in (inhale). These radioactive compounds are detected by a special camera during the scan, are given at very low doses, are not harmful to you, and last in your body for a very short time. Tell a health care provider about:  Any allergies you have.  All medicines you are taking, including vitamins, herbs, eye drops, creams, and over-the-counter medicines.  Any blood disorders you have.  Any surgeries you have had.  Any medical conditions you have.  Possibility of pregnancy, if this applies.  Breastfeeding, if this applies. What are the risks? Generally, this is a safe procedure. However, as with any procedure, complications can occur. A possible complication includes having an allergic reaction to the radioactive compounds. What happens before the procedure?  Do not smoke before your test.  Take medicine as directed by your health care provider. What happens during the procedure?  A small needle will be placed in a vein in your arm or hand. This needle will stay in place for the entire exam.  A small amount of very short-acting radioactive material will be injected.  Your lungs will then be scanned using a special camera. This camera will record the images.  You will be asked to inhale a second radioactive compound. After this, the lungs are scanned again. What happens after the procedure?  You may go home unless your health care provider instructs you differently.  You may continue with normal activities and diet as instructed by your health care provider. This information is not intended to replace advice given to you by your health care provider. Make sure  you discuss any questions you have with your health care provider. Document Released: 03/19/2000 Document Revised: 08/28/2015 Document Reviewed: 10/05/2012 Elsevier Interactive Patient Education  2017 [employer]

## 2016-11-18 NOTE — Progress Notes (Signed)
Cardiology Office Note   Date:  11/18/2016   ID:  Jean HeightLetitia Berry, DOB 10-12-1965, MRN [id number]  PCP:  Jean Berry, Jean Berry  Cardiologist:   Jean Siiffany Goshen, MD   Chief Complaint  Patient presents with  . Palpitations    pt c/o fast heart rate  . Shortness of Breath    pt c/o SOB on exertion  . Edema    in legs, ankle and feet      History of Present Illness: Jean Berry is a 51 y.o. female with diabetes who is being seen today for the evaluation of palpitations and upper back pain at the request of Jean Berry, [name], Jean Berry.  Jean Berry saw Jean FeltsJanece Moore, Jean Berry on 10/11/16. At that time she reported discomfort between her shoulder blades. EKG revealed sinus tachycardia at 101 bpm. Basic metabolic panel, CBC, and thyroid function were unremarkable.  For the last 4-6 months she has been feeling pain in between her shoulder blades.  It doesn't improve with stretching her arms, though it feels like a pulled muscle.  There is no associated chest pain or shortness of breath.  However, she has noted diminished exercise capacity.  8 months ago she was able to participate in December classes and was exercising twice daily with no symptoms. However, lately she gets short of breath with minimal exertion. She denies exertional chest pain or pressure. The episodes of pain in her back typically occur when sitting. Her exercise is also been limited by bilateral knee pain, and she has severe osteoarthritis.  Jean Berry also reports lower extremity edema bilaterally. It typically improves with elevation of her legs but returns the following day. She has tried wearing compression stockings but stopped because they caused lines in her lower legs. She tried limiting the salt in her diet without significant improvement.She denies orthopnea or PND.  She also reports episodes of heart pounding. Her heart rate is usually in the 60s to 70s. However at times it goes to the low 100s. This also occurs at rest. This happens  2-3 times per day and last for at least 15 minutes each time. There is no associated chest pain, lightheadedness, or dizziness.  She does not drink any coffee but drinks approximately one teaper day. She denies over-the-counter cold or cough medications  or illicits.  At home her blood pressure has been mostly in the 120s to 130s.  Past Medical History:  Diagnosis Date  . Arthritis   . Diabetes mellitus without complication (HCC)   . Gout flare   . Lower extremity edema 11/18/2016  . Osteoarthritis of back   . Palpitations   . Shortness of breath 11/18/2016  . Thyroid disease    left lobect approx 2012--benign per pt    Past Surgical History:  Procedure Laterality Date  . COLONOSCOPY    . GALLBLADDER SURGERY    . OVARIAN CYST SURGERY Right   . THYROID SURGERY Left   . TONSILECTOMY, ADENOIDECTOMY, BILATERAL MYRINGOTOMY AND TUBES    . TUBAL LIGATION    . UPPER GASTROINTESTINAL ENDOSCOPY       Current Outpatient Prescriptions  Medication Sig Dispense Refill  . cyclobenzaprine (FLEXERIL) 10 MG tablet Take 10 mg by mouth 3 (three) times daily as needed.  0  . Diclofenac Sodium (PENNSAID) 2 % SOLN Place 1 application onto the skin 2 (two) times daily. (Patient taking differently: Place 1 application onto the skin as needed. ) 112 g 2  . liraglutide (VICTOZA) 18 MG/3ML SOPN inject 1.8 mg  by subcutaneous route  every day    . loratadine (CLARITIN) 10 MG tablet Take 10 mg by mouth daily.  2  . meloxicam (MOBIC) 15 MG tablet Take 1 tablet (15 mg total) by mouth daily. 30 tablet 1  . metFORMIN (GLUCOPHAGE-XR) 500 MG 24 hr tablet TAKE 4 TABLETS BY MOUTH EVERY DAY WITH BREAKFAST 120 tablet 2   No current facility-administered medications for this visit.     Allergies:   Patient has no known allergies.    Social History:  The patient  reports that she has never smoked. She has never used smokeless tobacco. She reports that she does not drink alcohol or use drugs.   Family History:  The  patient's family history includes Diabetes in her maternal grandmother; Heart attack in her maternal grandmother; Heart disease in her mother; Lung cancer in her mother.    ROS:  Please see the history of present illness.   Otherwise, review of systems are positive for none.   All other systems are reviewed and negative.    PHYSICAL EXAM: VS:  BP 121/76   Pulse 91   Ht 5\' 6"  (1.676 m)   Wt 133.6 kg (294 lb 9.6 oz)   BMI 47.55 kg/m  , BMI Body mass index is 47.55 kg/m. GENERAL:  Well appearing HEENT:  Pupils equal round and reactive, fundi not visualized, oral mucosa unremarkable NECK:  No jugular venous distention, waveform within normal limits, carotid upstroke brisk and symmetric, no bruits, no thyromegaly LYMPHATICS:  No cervical adenopathy LUNGS:  Clear to auscultation bilaterally HEART:  RRR.  PMI not displaced or sustained,S1 and S2 within normal limits, no S3, no S4, no clicks, no rubs, no murmurs ABD:  Flat, positive bowel sounds normal in frequency in pitch, no bruits, no rebound, no guarding, no midline pulsatile mass, no hepatomegaly, no splenomegaly EXT:  2 plus pulses throughout, 1+ bilateral pitting edema to the knee bilaterally, no cyanosis no clubbing SKIN:  No rashes no nodules NEURO:  Cranial nerves II through XII grossly intact, motor grossly intact throughout PSYCH:  Cognitively intact, oriented to person place and time   EKG:  EKG is ordered today. The ekg ordered today demonstrates sinus rhythm. Rate 91 bpm.   Recent Labs: No results found for requested labs within last 8760 hours.   10/11/16: Sodium 144, potassium 4.3, BUN 8, creatinine 0.78 WBC 9.3, hemoglobin 11.9, hematocrit 36.3, platelets 368 Hemoglobin A1c 6.6% TSH 3.1  Lipid Panel No results found for: CHOL, TRIG, HDL, CHOLHDL, VLDL, LDLCALC, LDLDIRECT    Wt Readings from Last 3 Encounters:  11/18/16 133.6 kg (294 lb 9.6 oz)  07/09/16 135.9 kg (299 lb 9.6 oz)  12/19/15 126.6 kg (279 lb)       ASSESSMENT AND PLAN:  # Palpitations:  Thyroid testing, CBC, and BMP have been unremarkable. We will get a 48-hour Holter to better evaluate.  # Shortness of breath: # LE Edema: JVD is not elevated and she has no crackles on exam.  Suspect that her symptoms are more related to venous insufficiency than heart failure. I recommended that she wear compression stockings and make sure she stands at least once per hour while at work. We will get an echocardiogram to better assess.  We will also get a V/Q scan to look for PE.   # Back pain: Symptoms don't seem consistnet with ischemia.  This does not sound like aortic dissection and her BP/pulse are equal in both arms.    Echo  as above.   Current medicines are reviewed at length with the patient today.  The patient does not have concerns regarding medicines.  The following changes have been made:  no change  Labs/ tests ordered today include:   Orders Placed This Encounter  Procedures  . DG Chest 2 View  . NM Pulmonary Perf and Vent  . Holter monitor - 48 hour  . EKG 12-Lead  . ECHOCARDIOGRAM COMPLETE     Disposition:   FU with Hill Mackie C. Duke Salvia, MD, [facility] in 6 weeks.     This note was written with the assistance of speech recognition software.  Please excuse any transcriptional errors.  Signed, Aidon Klemens C. Duke Salvia, MD, [facility]  11/18/2016 5:02 PM    [facility]

## 2016-11-19 DIAGNOSIS — R221 Localized swelling, mass and lump, neck: Secondary | ICD-10-CM | POA: Diagnosis not present

## 2016-11-23 ENCOUNTER — Encounter (HOSPITAL_COMMUNITY): Payer: 59

## 2016-11-23 ENCOUNTER — Ambulatory Visit (HOSPITAL_COMMUNITY): Payer: 59

## 2016-11-26 ENCOUNTER — Other Ambulatory Visit (HOSPITAL_COMMUNITY): Payer: 59

## 2016-11-26 ENCOUNTER — Encounter (HOSPITAL_COMMUNITY): Payer: 59

## 2016-11-29 DIAGNOSIS — M25561 Pain in right knee: Secondary | ICD-10-CM | POA: Diagnosis not present

## 2016-11-29 DIAGNOSIS — M25562 Pain in left knee: Secondary | ICD-10-CM | POA: Diagnosis not present

## 2016-11-29 DIAGNOSIS — M170 Bilateral primary osteoarthritis of knee: Secondary | ICD-10-CM | POA: Diagnosis not present

## 2016-12-04 ENCOUNTER — Other Ambulatory Visit: Payer: Self-pay | Admitting: Endocrinology

## 2016-12-04 NOTE — Telephone Encounter (Signed)
Please refill x 1 Ov is due  

## 2016-12-07 ENCOUNTER — Other Ambulatory Visit (HOSPITAL_COMMUNITY): Payer: 59

## 2016-12-07 ENCOUNTER — Other Ambulatory Visit: Payer: Self-pay

## 2016-12-07 MED ORDER — METFORMIN HCL ER 500 MG PO TB24: ORAL_TABLET | ORAL | 1 refills | Status: DC

## 2016-12-08 ENCOUNTER — Telehealth (HOSPITAL_COMMUNITY): Payer: Self-pay | Admitting: Cardiovascular Disease

## 2016-12-08 NOTE — Telephone Encounter (Signed)
  12/02/16 Patient (pt is calling to cancel-not sure if she needs. not having symptoms. )  Patient will be removed from the workqueue.

## 2016-12-09 DIAGNOSIS — M25562 Pain in left knee: Secondary | ICD-10-CM | POA: Diagnosis not present

## 2016-12-09 DIAGNOSIS — M170 Bilateral primary osteoarthritis of knee: Secondary | ICD-10-CM | POA: Diagnosis not present

## 2016-12-09 DIAGNOSIS — M25561 Pain in right knee: Secondary | ICD-10-CM | POA: Diagnosis not present

## 2016-12-22 DIAGNOSIS — M25561 Pain in right knee: Secondary | ICD-10-CM | POA: Diagnosis not present

## 2016-12-22 DIAGNOSIS — M170 Bilateral primary osteoarthritis of knee: Secondary | ICD-10-CM | POA: Diagnosis not present

## 2016-12-22 DIAGNOSIS — M25562 Pain in left knee: Secondary | ICD-10-CM | POA: Diagnosis not present

## 2016-12-24 ENCOUNTER — Ambulatory Visit (HOSPITAL_COMMUNITY)
Admission: RE | Admit: 2016-12-24 | Discharge: 2016-12-24 | Disposition: A | Discharge status: Home / Self Care | Payer: 59 | Source: Ambulatory Visit | Attending: Cardiovascular Disease | Admitting: Cardiovascular Disease

## 2016-12-24 DIAGNOSIS — R600 Localized edema: Secondary | ICD-10-CM | POA: Insufficient documentation

## 2016-12-24 DIAGNOSIS — R0602 Shortness of breath: Secondary | ICD-10-CM | POA: Diagnosis not present

## 2016-12-24 DIAGNOSIS — R002 Palpitations: Secondary | ICD-10-CM | POA: Diagnosis not present

## 2016-12-24 MED ORDER — TECHNETIUM TC 99M DIETHYLENETRIAME-PENTAACETIC ACID
31.0000 | Freq: Once | INTRAVENOUS | Status: AC
  Administered 2016-12-24: 31 via RESPIRATORY_TRACT

## 2016-12-24 MED ORDER — TECHNETIUM TO 99M ALBUMIN AGGREGATED
4.2000 | Freq: Once | INTRAVENOUS | Status: AC
  Administered 2016-12-24: 4.2 via INTRAVENOUS

## 2016-12-31 ENCOUNTER — Telehealth: Payer: Self-pay | Admitting: Cardiovascular Disease

## 2016-12-31 DIAGNOSIS — R002 Palpitations: Secondary | ICD-10-CM

## 2016-12-31 DIAGNOSIS — R600 Localized edema: Secondary | ICD-10-CM

## 2016-12-31 DIAGNOSIS — R0602 Shortness of breath: Secondary | ICD-10-CM

## 2016-12-31 NOTE — Telephone Encounter (Signed)
New message     Do you still want patient to come in 10/3 she has not had echo or holter monitor done, please put in order for echo and reschedule echo and holter for same day

## 2016-12-31 NOTE — Telephone Encounter (Signed)
Unsure if patient had these tests or the ordered VQ scan. Does she need to delay f/u until she gets these done?

## 2016-12-31 NOTE — Telephone Encounter (Signed)
Patient would like to reschedule echo and monitor and see Dr Duke Salvia afterwards. Will send to schedulers to call and get scheduled.

## 2017-01-03 ENCOUNTER — Ambulatory Visit: Payer: 59 | Admitting: Cardiology

## 2017-01-03 ENCOUNTER — Telehealth: Payer: Self-pay | Admitting: *Deleted

## 2017-01-03 NOTE — Telephone Encounter (Signed)
Left message for patient to call and reschedule Echo and monitor---then follow up visit with Dr. Duke Salvia.

## 2017-01-05 ENCOUNTER — Ambulatory Visit: Payer: 59 | Admitting: Cardiovascular Disease

## 2017-01-06 DIAGNOSIS — R221 Localized swelling, mass and lump, neck: Secondary | ICD-10-CM | POA: Diagnosis not present

## 2017-01-07 ENCOUNTER — Encounter (HOSPITAL_COMMUNITY): Payer: Self-pay | Admitting: Emergency Medicine

## 2017-01-07 ENCOUNTER — Emergency Department (HOSPITAL_COMMUNITY): Payer: 59

## 2017-01-07 ENCOUNTER — Ambulatory Visit (HOSPITAL_COMMUNITY)
Admission: EM | Admit: 2017-01-07 | Discharge: 2017-01-09 | Disposition: A | Discharge status: Home / Self Care | Payer: 59 | Attending: Surgery | Admitting: Surgery

## 2017-01-07 DIAGNOSIS — E079 Disorder of thyroid, unspecified: Secondary | ICD-10-CM | POA: Insufficient documentation

## 2017-01-07 DIAGNOSIS — E119 Type 2 diabetes mellitus without complications: Secondary | ICD-10-CM | POA: Insufficient documentation

## 2017-01-07 DIAGNOSIS — Z79899 Other long term (current) drug therapy: Secondary | ICD-10-CM | POA: Insufficient documentation

## 2017-01-07 DIAGNOSIS — M479 Spondylosis, unspecified: Secondary | ICD-10-CM | POA: Diagnosis not present

## 2017-01-07 DIAGNOSIS — Z91040 Latex allergy status: Secondary | ICD-10-CM | POA: Insufficient documentation

## 2017-01-07 DIAGNOSIS — M542 Cervicalgia: Secondary | ICD-10-CM | POA: Diagnosis not present

## 2017-01-07 DIAGNOSIS — L0211 Cutaneous abscess of neck: Secondary | ICD-10-CM | POA: Diagnosis not present

## 2017-01-07 DIAGNOSIS — Z91030 Bee allergy status: Secondary | ICD-10-CM | POA: Diagnosis not present

## 2017-01-07 DIAGNOSIS — Z7984 Long term (current) use of oral hypoglycemic drugs: Secondary | ICD-10-CM | POA: Diagnosis not present

## 2017-01-07 DIAGNOSIS — L0291 Cutaneous abscess, unspecified: Secondary | ICD-10-CM

## 2017-01-07 DIAGNOSIS — Z23 Encounter for immunization: Secondary | ICD-10-CM | POA: Diagnosis not present

## 2017-01-07 DIAGNOSIS — L03221 Cellulitis of neck: Secondary | ICD-10-CM

## 2017-01-07 LAB — CBC WITH DIFFERENTIAL/PLATELET
BASOS PCT: 0 %
Basophils Absolute: 0 10*3/uL (ref 0.0–0.1)
EOS ABS: 0.1 10*3/uL (ref 0.0–0.7)
EOS PCT: 2 %
HCT: 35.5 % — ABNORMAL LOW (ref 36.0–46.0)
Hemoglobin: 11.8 g/dL — ABNORMAL LOW (ref 12.0–15.0)
LYMPHS ABS: 2.2 10*3/uL (ref 0.7–4.0)
Lymphocytes Relative: 24 %
MCH: 27.7 pg (ref 26.0–34.0)
MCHC: 33.2 g/dL (ref 30.0–36.0)
MCV: 83.3 fL (ref 78.0–100.0)
Monocytes Absolute: 0.5 10*3/uL (ref 0.1–1.0)
Monocytes Relative: 6 %
Neutro Abs: 6.2 10*3/uL (ref 1.7–7.7)
Neutrophils Relative %: 68 %
PLATELETS: 304 10*3/uL (ref 150–400)
RBC: 4.26 MIL/uL (ref 3.87–5.11)
RDW: 14.5 % (ref 11.5–15.5)
WBC: 9.1 10*3/uL (ref 4.0–10.5)

## 2017-01-07 LAB — COMPREHENSIVE METABOLIC PANEL
ALBUMIN: 3.2 g/dL — AB (ref 3.5–5.0)
ALK PHOS: 58 U/L (ref 38–126)
ALT: 27 U/L (ref 14–54)
AST: 25 U/L (ref 15–41)
Anion gap: 9 (ref 5–15)
BILIRUBIN TOTAL: 0.5 mg/dL (ref 0.3–1.2)
BUN: 7 mg/dL (ref 6–20)
CO2: 26 mmol/L (ref 22–32)
Calcium: 8.7 mg/dL — ABNORMAL LOW (ref 8.9–10.3)
Chloride: 102 mmol/L (ref 101–111)
Creatinine, Ser: 0.8 mg/dL (ref 0.44–1.00)
GFR calc Af Amer: >60 mL/min (ref >60)
GFR calc non Af Amer: >60 mL/min (ref >60)
GLUCOSE: 171 mg/dL — AB (ref 65–99)
POTASSIUM: 3.8 mmol/L (ref 3.5–5.1)
Sodium: 137 mmol/L (ref 135–145)
TOTAL PROTEIN: 6.7 g/dL (ref 6.5–8.1)

## 2017-01-07 LAB — I-STAT CG4 LACTIC ACID, ED: Lactic Acid, Venous: 1.5 mmol/L (ref 0.5–1.9)

## 2017-01-07 MED ORDER — FENTANYL CITRATE (PF) 100 MCG/2ML IJ SOLN
50.0000 ug | Freq: Once | INTRAMUSCULAR | Status: AC
  Administered 2017-01-07: 50 ug via INTRAVENOUS
  Filled 2017-01-07: qty 2

## 2017-01-07 MED ORDER — CLINDAMYCIN PHOSPHATE 600 MG/50ML IV SOLN
600.0000 mg | Freq: Once | INTRAVENOUS | Status: AC
  Administered 2017-01-07: 600 mg via INTRAVENOUS
  Filled 2017-01-07: qty 50

## 2017-01-07 MED ORDER — IOPAMIDOL (ISOVUE-300) INJECTION 61%
INTRAVENOUS | Status: AC
  Administered 2017-01-07: 75 mL
  Filled 2017-01-07: qty 75

## 2017-01-07 NOTE — ED Provider Notes (Signed)
[facility]-EMERGENCY DEPT Provider Note   CSN: [id number] Arrival date & time: 01/07/17  1808     History   Chief Complaint Chief Complaint  Patient presents with  . Neck Pain    HPI Jean Berry is a 51 y.o. female patient presents with 2 weeks of progressively worsening neck pain. Patient reports that a few months ago, she noticed a small bump to the posterior aspect of her lower scalp. Patient reports that she was seen by her PCP did not feel he was an abscess at this time. PCP decided to observe symptoms. Patient reports over the last 2 weeks, the pain has spread from the area on her head down to her neck. Patient reports that she has limited mobility of the neck secondary to symptoms. Patient was seen by her primary care doctor yesterday for evaluation of symptoms. There is no appreciable abscess at that time. Patient was started on Keflex for treatment of cellulitis. Patient reports that she has taken the Keflex last night and this morning. Patient reports that she's been taking ibuprofen with pain with minimal improvement. Patient reports that she comes the emergency Department today for worsening pain. She has not noticed any warmth or redness to the area. Patient reports that pain is exacerbated with movement of the neck. She also reports a "throbbing pain." Patient denies any fevers, difficulty swelling, sore throat, chest pain, difficulty breathing.  The history is provided by the patient.    Past Medical History:  Diagnosis Date  . Arthritis   . Diabetes mellitus without complication (HCC)   . Gout flare   . Lower extremity edema 11/18/2016  . Osteoarthritis of back   . Palpitations   . Shortness of breath 11/18/2016  . Thyroid disease    left lobect approx 2012--benign per pt    Patient Active Problem List   Diagnosis Date Noted  . Neck abscess 01/08/2017  . Shortness of breath 11/18/2016  . Lower extremity edema 11/18/2016  . Back pain 11/15/2016  . Palpitations  11/15/2016  . Tachycardia 11/15/2016  . Primary osteoarthritis of both knees 07/09/2016  . Diabetes mellitus without complication (HCC)   . Arthritis   . Lumbar spondylosis   . Gout flare   . Thyroid disease     Past Surgical History:  Procedure Laterality Date  . COLONOSCOPY    . GALLBLADDER SURGERY    . OVARIAN CYST SURGERY Right   . THYROID SURGERY Left   . TONSILECTOMY, ADENOIDECTOMY, BILATERAL MYRINGOTOMY AND TUBES    . TUBAL LIGATION    . UPPER GASTROINTESTINAL ENDOSCOPY      OB History    No data available       Home Medications    Prior to Admission medications   Medication Sig Start Date End Date Taking? Authorizing Provider  Ascorbic Acid (VITAMIN C) 100 MG tablet Take 100 mg by mouth daily.   Yes [provider]  Bioflavonoid Products (BIOFLEX PO) Take 1 tablet by mouth daily.   Yes [provider]  BIOTIN PO Take 1 tablet by mouth daily.   Yes [provider]  cephALEXin (KEFLEX) 500 MG capsule Take 500 mg by mouth every 12 (twelve) hours. 01/06/17  Yes [provider]  cyclobenzaprine (FLEXERIL) 10 MG tablet Take 10 mg by mouth 3 (three) times daily as needed. 10/11/16  Yes [provider]  Diclofenac Sodium (PENNSAID) 2 % SOLN Place 1 application onto the skin 2 (two) times daily. Patient taking differently: Place 1 application  onto the skin as needed.  07/09/16  Yes Andrena Mews, DO  ferrous sulfate 325 (65 FE) MG tablet Take 325 mg by mouth daily with breakfast.   Yes [provider]  loratadine (CLARITIN) 10 MG tablet Take 10 mg by mouth daily. 01/30/16  Yes [provider]  magnesium 30 MG tablet Take 30 mg by mouth 2 (two) times daily.   Yes [provider]  metFORMIN (GLUCOPHAGE-XR) 500 MG 24 hr tablet TAKE 4 TABLETS BY MOUTH EVERY DAY WITH BREAKFAST Patient taking differently: 500 mg 4 (four) times daily.  12/07/16  Yes Romero Belling, MD  Multiple Vitamin (MULTIVITAMIN) tablet Take 1  tablet by mouth daily.   Yes [provider]  Multiple Vitamins-Minerals (HAIR VITAMINS PO) Take 1 tablet by mouth daily.   Yes [provider]  phentermine 15 MG capsule Take 15 mg by mouth daily. 12/30/16  Yes [provider]  meloxicam (MOBIC) 15 MG tablet Take 1 tablet (15 mg total) by mouth daily. Patient not taking: Reported on 01/07/2017 07/09/16   Andrena Mews, DO    Family History Family History  Problem Relation Age of Onset  . Lung cancer Mother   . Heart disease Mother   . Diabetes Maternal Grandmother   . Heart attack Maternal Grandmother     Social History Social History  Substance Use Topics  . Smoking status: Never Smoker  . Smokeless tobacco: Never Used  . Alcohol use No     Allergies   Bee venom and Latex   Review of Systems Review of Systems  Constitutional: Negative for fever.  HENT: Negative for sore throat and trouble swallowing.   Respiratory: Negative for shortness of breath.   Cardiovascular: Negative for chest pain.  Gastrointestinal: Negative for nausea and vomiting.  Musculoskeletal: Positive for neck pain.  Skin: Negative for color change.     Physical Exam Updated Vital Signs BP (!) 106/55   Pulse (!) 103   Temp 100 F (37.8 C) (Oral)   Resp 18   Ht  (1.676 m)   Wt 133.4 kg (294 lb)   SpO2 92%   BMI 47.45 kg/m   Physical Exam  Constitutional: She appears well-developed and well-nourished.  Appears uncomfortable but no acute distress   HENT:  Head: Normocephalic and atraumatic.  Mouth/Throat: Uvula is midline, oropharynx is clear and moist and mucous membranes are normal. No trismus in the jaw.  No oral angioedema. Airway patent. Uvula is midline. No trismus.   Eyes: Conjunctivae and EOM are normal. Right eye exhibits no discharge. Left eye exhibits no discharge. No scleral icterus.  Neck: Trachea normal. Neck supple. No neck rigidity. Decreased range of motion present.  Limited flexion and  extension of neck secondary to patient's pain. Good lateral movement of neck. Neck is supple, not rigid. Posterior neck has a diffuse area of induration and thickening that extends from the approximately C4 region down to the paraspinal muscles. No overlying warmth or erythema. No palpable fluctuance.   Pulmonary/Chest: Effort normal.  Neurological: She is alert.  Skin: Skin is warm and dry.  Psychiatric: She has a normal mood and affect. Her speech is normal and behavior is normal.  Nursing note and vitals reviewed.    ED Treatments / Results  Labs (all labs ordered are listed, but only abnormal results are displayed) Labs Reviewed  COMPREHENSIVE METABOLIC PANEL - Abnormal; Notable for the following:       Result Value   Glucose, Bld  171 (*)    Calcium 8.7 (*)    Albumin 3.2 (*)    All other components within normal limits  CBC WITH DIFFERENTIAL/PLATELET - Abnormal; Notable for the following:    Hemoglobin 11.8 (*)    HCT 35.5 (*)    All other components within normal limits  I-STAT CG4 LACTIC ACID, ED    EKG  EKG Interpretation None       Radiology Ct Soft Tissue Neck W Contrast  Result Date: 01/07/2017 CLINICAL DATA:  Initial evaluation for pain/ knot at back of neck. EXAM: CT NECK WITH CONTRAST TECHNIQUE: Multidetector CT imaging of the neck was performed using the standard protocol following the bolus administration of intravenous contrast. CONTRAST:  75mL ISOVUE-300 IOPAMIDOL (ISOVUE-300) INJECTION 61% COMPARISON:  None available. FINDINGS: Pharynx and larynx: Oral cavity within normal limits without mass lesion or loculated fluid collection. No acute abnormality about the dentition. Palatine tonsils symmetric and within normal limits bilaterally. Parapharyngeal 5 preserved. Nasopharynx normal. Retropharyngeal soft tissues within normal limits. Epiglottis normal. Vallecula largely effaced by the lingual tonsils. Remainder of the hypopharynx and supraglottic larynx within  normal limits. True cords apposed and not well evaluated. Subglottic airway clear. Salivary glands: Salivary glands including the parotid and submandibular glands are within normal limits. 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 8 mm hypodense nodule noted within the right thyroid lobe, of doubtful significance. Right thyroid lobe otherwise unremarkable. Lymph nodes: Mildly prominent right suboccipital lymph node measures up to 10 mm, likely reactive. No other adenopathy identified within the neck. Vascular: Normal intravascular enhancement seen throughout the neck. Limited intracranial: Unremarkable. Visualized orbits: Visualized globes and orbital soft tissues within normal limits. Mastoids and visualized paranasal sinuses: Visualized paranasal sinuses are clear. Mastoid air cells and middle ear cavities are well pneumatized. Skeleton: No acute osseus abnormality. No worrisome lytic or blastic osseous lesions. Upper chest: Visualized upper chest within normal limits. Partially visualized lungs are clear. Other: There is a loculated rim enhancing collection positioned along the midline of the posterior neck/upper back measuring 2.1 x 3.4 x 3.0 cm (series 3, image 51). Additional 1.1 x 1.0 x 2.5 cm collection positioned immediately to the right (series 3, image 45). These collections may be contiguous with 1 another superiorly. These are positioned within the subcutaneous fat, and extend towards the overlying skin at their superior margins. Associated inflammatory stranding with swelling within the adjacent fat and upper trapezius musculature. Findings consistent with abscess with associated cellulitis. IMPRESSION: Loculated rim enhancing collections within the posterior neck as above, consistent with abscesses. Associated swelling and inflammatory stranding within the adjacent soft tissues consistent with associated cellulitis/infection. Electronically Signed   By: Rise Mu M.D.    On: 01/07/2017 22:33    Procedures Procedures (including critical care time)  Medications Ordered in ED Medications  fentaNYL (SUBLIMAZE) injection 50 mcg (50 mcg Intravenous Given 01/07/17 2159)  iopamidol (ISOVUE-300) 61 % injection (75 mLs  Contrast Given 01/07/17 2209)  clindamycin (CLEOCIN) IVPB 600 mg (0 mg Intravenous Stopped 01/08/17 0039)  HYDROmorphone (DILAUDID) injection 1 mg (1 mg Intravenous Given 01/08/17 0039)     Initial Impression / Assessment and Plan / ED Course  I have reviewed the triage vital signs and the nursing notes.  Pertinent labs & imaging results that were available during my care of the patient were reviewed by me and considered in my medical decision making (see chart for details).     51 year old female who presents with 2 weeks of  progressively worsening posterior neck pain. Patient reports initially started out as small bumps a few months ago. PCP did not suspect abscess at the time. Reports the symptoms worsen in the last 2 weeks. Seen by PCP yesterday and prescribed Keflex for treatment of cellulitis. Comes into ED today because pain has significantly worsened and she feels like symptoms are progressively spreading. Patient is afebrile, non-toxic appearing, sitting comfortably on examination table. Vital signs reviewed and stable. Patient is slightly tachycardic and hypertensive, likely secondary to pain. We will give analgesics and reassess. Do not suspect meningitis based on history/physicalexam. Initial labs ordered at triage. Given concern for abscess, will plan for CT soft tissue neck.  Labs reviewed. CBC with normal white blood cell count. CMP shows hyperglycemia otherwise unremarkable. Initial lactic acid is negative.  CT soft tissue neck reviewed. Result show a loculated rim enhancing collection in the posterior neck that measures approximately 3 cm with another one that measures approximately 2 cm that is concerning for an abscess. It extends down  into the subcutaneous fat and extends toward the overlying skin. There is associated inflammatory stranding that is consistent with cellulitis. Given depth and location of abscess, concern that he needs surgical drainage. Will plan to consult general surgery for evaluation.  Discussed patient with Dr. Luisa Hart (General Surgery) regarding results of CT. Will plan to admit patient with possible surgical drainage tomorrow morning. Recommend starting IV clindamycin in the department tonight.  Updated patient on plan. She is agreeable for admission. IV antibiotics started. Additional pain medications given.  Final Clinical Impressions(s) / ED Diagnoses   Final diagnoses:  Neck pain  Abscess    New Prescriptions New Prescriptions   No medications on file     Rosana Hoes 01/08/17 0156    Abelino Derrick, MD 01/08/17 1523

## 2017-01-07 NOTE — H&P (Signed)
Jean Berry is an 51 y.o. female.   Chief Complaint: Neck pain for 2 days HPI: The patient presents to emergency room with a 2 day history of neck pain. She states she had not back of her neck last month that her primary care physician looked at. It wasn't causing too much problem until 2 days ago he still redness, swelling, pain on the posterior aspect of her neck upper back and scalp. Patient denies fever or chills with low-grade temperature 100 the emergency room tonight. CT scan was done which shows 3 small subcutaneous abscesses in the soft tissues superficial to the musculature of the neck. Her white count is normal. She has pain in the posterior aspect of her neck according of her scalp.  Past Medical History:  Diagnosis Date  . Arthritis   . Diabetes mellitus without complication ([place])   . Gout flare   . Lower extremity edema 11/18/2016  . Osteoarthritis of back   . Palpitations   . Shortness of breath 11/18/2016  . Thyroid disease    left lobect approx 2012--benign per pt    Past Surgical History:  Procedure Laterality Date  . COLONOSCOPY    . GALLBLADDER SURGERY    . OVARIAN CYST SURGERY Right   . THYROID SURGERY Left   . TONSILECTOMY, ADENOIDECTOMY, BILATERAL MYRINGOTOMY AND TUBES    . TUBAL LIGATION    . UPPER GASTROINTESTINAL ENDOSCOPY      Family History  Problem Relation Age of Onset  . Lung cancer Mother   . Heart disease Mother   . Diabetes Maternal Grandmother   . Heart attack Maternal Grandmother    Social History:  reports that she has never smoked. She has never used smokeless tobacco. She reports that she does not drink alcohol or use drugs.  Allergies:  Allergies  Allergen Reactions  . Bee Venom Anaphylaxis  . Latex Rash     (Not in a hospital admission)  Results for orders placed or performed during the hospital encounter of 01/07/17 (from the past 48 hour(s))  Comprehensive metabolic panel     Status: Abnormal   Collection Time: 01/07/17   6:25 PM  Result Value Ref Range   Sodium 137 135 - 145 mmol/L   Potassium 3.8 3.5 - 5.1 mmol/L   Chloride 102 101 - 111 mmol/L   CO2 26 22 - 32 mmol/L   Glucose, Bld 171 (H) 65 - 99 mg/dL   BUN 7 6 - 20 mg/dL   Creatinine, Ser 0.80 0.44 - 1.00 mg/dL   Calcium 8.7 (L) 8.9 - 10.3 mg/dL   Total Protein 6.7 6.5 - 8.1 g/dL   Albumin 3.2 (L) 3.5 - 5.0 g/dL   AST 25 15 - 41 U/L   ALT 27 14 - 54 U/L   Alkaline Phosphatase 58 38 - 126 U/L   Total Bilirubin 0.5 0.3 - 1.2 mg/dL   GFR calc non Af Amer >60 >60 mL/min   GFR calc Af Amer >60 >60 mL/min    Comment: (NOTE) The eGFR has been calculated using the CKD EPI equation. This calculation has not been validated in all clinical situations. eGFR's persistently <60 mL/min signify possible Chronic Kidney Disease.    Anion gap 9 5 - 15  CBC with Differential     Status: Abnormal   Collection Time: 01/07/17  6:25 PM  Result Value Ref Range   WBC 9.1 4.0 - 10.5 K/uL   RBC 4.26 3.87 - 5.11 MIL/uL   Hemoglobin 11.8 (  L) 12.0 - 15.0 g/dL   HCT 35.5 (L) 36.0 - 46.0 %   MCV 83.3 78.0 - 100.0 fL   MCH 27.7 26.0 - 34.0 pg   MCHC 33.2 30.0 - 36.0 g/dL   RDW 14.5 11.5 - 15.5 %   Platelets 304 150 - 400 K/uL   Neutrophils Relative % 68 %   Neutro Abs 6.2 1.7 - 7.7 K/uL   Lymphocytes Relative 24 %   Lymphs Abs 2.2 0.7 - 4.0 K/uL   Monocytes Relative 6 %   Monocytes Absolute 0.5 0.1 - 1.0 K/uL   Eosinophils Relative 2 %   Eosinophils Absolute 0.1 0.0 - 0.7 K/uL   Basophils Relative 0 %   Basophils Absolute 0.0 0.0 - 0.1 K/uL  I-Stat CG4 Lactic Acid, ED     Status: None   Collection Time: 01/07/17  6:39 PM  Result Value Ref Range   Lactic Acid, Venous 1.50 0.5 - 1.9 mmol/L   Ct Soft Tissue Neck W Contrast  Result Date: 01/07/2017 CLINICAL DATA:  Initial evaluation for pain/ knot at back of neck. EXAM: CT NECK WITH CONTRAST TECHNIQUE: Multidetector CT imaging of the neck was performed using the standard protocol following the bolus  administration of intravenous contrast. CONTRAST:  89m ISOVUE-300 IOPAMIDOL (ISOVUE-300) INJECTION 61% COMPARISON:  None available. FINDINGS: Pharynx and larynx: Oral cavity within normal limits without mass lesion or loculated fluid collection. No acute abnormality about the dentition. Palatine tonsils symmetric and within normal limits bilaterally. Parapharyngeal 5 preserved. Nasopharynx normal. Retropharyngeal soft tissues within normal limits. Epiglottis normal. Vallecula largely effaced by the lingual tonsils. Remainder of the hypopharynx and supraglottic larynx within normal limits. True cords apposed and not well evaluated. Subglottic airway clear. Salivary glands: Salivary glands including the parotid and submandibular glands are within normal limits. 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 8 mm hypodense nodule noted within the right thyroid lobe, of doubtful significance. Right thyroid lobe otherwise unremarkable. Lymph nodes: Mildly prominent right suboccipital lymph node measures up to 10 mm, likely reactive. No other adenopathy identified within the neck. Vascular: Normal intravascular enhancement seen throughout the neck. Limited intracranial: Unremarkable. Visualized orbits: Visualized globes and orbital soft tissues within normal limits. Mastoids and visualized paranasal sinuses: Visualized paranasal sinuses are clear. Mastoid air cells and middle ear cavities are well pneumatized. Skeleton: No acute osseus abnormality. No worrisome lytic or blastic osseous lesions. Upper chest: Visualized upper chest within normal limits. Partially visualized lungs are clear. Other: There is a loculated rim enhancing collection positioned along the midline of the posterior neck/upper back measuring 2.1 x 3.4 x 3.0 cm (series 3, image 51). Additional 1.1 x 1.0 x 2.5 cm collection positioned immediately to the right (series 3, image 45). These collections may be contiguous with 1 another  superiorly. These are positioned within the subcutaneous fat, and extend towards the overlying skin at their superior margins. Associated inflammatory stranding with swelling within the adjacent fat and upper trapezius musculature. Findings consistent with abscess with associated cellulitis. IMPRESSION: Loculated rim enhancing collections within the posterior neck as above, consistent with abscesses. Associated swelling and inflammatory stranding within the adjacent soft tissues consistent with associated cellulitis/infection. Electronically Signed   By: BJeannine BogaM.D.   On: 01/07/2017 22:33    Review of Systems  Constitutional: Positive for chills and fever.  HENT: Negative for hearing loss and tinnitus.   Eyes: Negative for blurred vision and double vision.  Respiratory: Negative for cough and hemoptysis.  Cardiovascular: Positive for palpitations and leg swelling. Negative for chest pain.  Gastrointestinal: Negative for heartburn and nausea.  Genitourinary: Negative for dysuria and urgency.  Skin: Negative for itching and rash.  Neurological: Negative for dizziness.  Psychiatric/Behavioral: Negative for depression and suicidal ideas.    Blood pressure 110/62, pulse 100, temperature 100 F (37.8 C), temperature source Oral, resp. rate 18, height _0  (1.676 m), weight 133.4 kg (294 lb), SpO2 98 %. Physical Exam  Constitutional: She is oriented to person, place, and time. She appears well-developed and well-nourished.  HENT:  Head: Normocephalic.  Neck:    Neurological: She is alert and oriented to person, place, and time. She has normal strength. No cranial nerve deficit or sensory deficit. GCS eye subscore is 4. GCS verbal subscore is 5. GCS motor subscore is 6.  Skin:     Psychiatric: She has a normal mood and affect. Her behavior is normal.     Assessment/Plan Cellulitis and multiple subcutaneous abscesses posterior neck soft tissues superficial to the muscular the  neck  Diabetes mellitus 2  Admit for IV fluids, IV antibiotics and incision and drainage later on this morning.  Sliding scale insulin  DVT prophylaxis  Micaila Ziemba A., MD 01/07/2017, 11:59 PM

## 2017-01-07 NOTE — ED Triage Notes (Signed)
Pt c/o "knot" to the back of her neck. Seen by PCP in August and yesterday because the area has grown. Neck ROM limited, pt unable to lay flat due to pain. Area is firm, not warm to touch.

## 2017-01-08 ENCOUNTER — Encounter (HOSPITAL_COMMUNITY)
Admission: EM | Disposition: A | Discharge status: Home / Self Care | Payer: Self-pay | Source: Home / Self Care | Attending: Physician Assistant

## 2017-01-08 ENCOUNTER — Encounter (HOSPITAL_COMMUNITY): Payer: Self-pay | Admitting: Certified Registered"

## 2017-01-08 ENCOUNTER — Inpatient Hospital Stay (HOSPITAL_COMMUNITY): Payer: 59 | Admitting: Certified Registered"

## 2017-01-08 DIAGNOSIS — L0211 Cutaneous abscess of neck: Secondary | ICD-10-CM | POA: Diagnosis present

## 2017-01-08 DIAGNOSIS — Z7984 Long term (current) use of oral hypoglycemic drugs: Secondary | ICD-10-CM | POA: Diagnosis not present

## 2017-01-08 DIAGNOSIS — M170 Bilateral primary osteoarthritis of knee: Secondary | ICD-10-CM | POA: Diagnosis not present

## 2017-01-08 DIAGNOSIS — E119 Type 2 diabetes mellitus without complications: Secondary | ICD-10-CM | POA: Diagnosis not present

## 2017-01-08 DIAGNOSIS — L03221 Cellulitis of neck: Secondary | ICD-10-CM

## 2017-01-08 DIAGNOSIS — M549 Dorsalgia, unspecified: Secondary | ICD-10-CM | POA: Diagnosis not present

## 2017-01-08 HISTORY — PX: IRRIGATION AND DEBRIDEMENT ABSCESS: SHX5252

## 2017-01-08 LAB — CBC
HEMATOCRIT: 34.5 % — AB (ref 36.0–46.0)
Hemoglobin: 11.6 g/dL — ABNORMAL LOW (ref 12.0–15.0)
MCH: 27.9 pg (ref 26.0–34.0)
MCHC: 33.6 g/dL (ref 30.0–36.0)
MCV: 82.9 fL (ref 78.0–100.0)
Platelets: 295 10*3/uL (ref 150–400)
RBC: 4.16 MIL/uL (ref 3.87–5.11)
RDW: 14.6 % (ref 11.5–15.5)
WBC: 10.5 10*3/uL (ref 4.0–10.5)

## 2017-01-08 LAB — GLUCOSE, CAPILLARY
GLUCOSE-CAPILLARY: 160 mg/dL — AB (ref 65–99)
Glucose-Capillary: 175 mg/dL — ABNORMAL HIGH (ref 65–99)
Glucose-Capillary: 173 mg/dL — ABNORMAL HIGH (ref 65–99)

## 2017-01-08 LAB — SURGICAL PCR SCREEN
MRSA, PCR: NEGATIVE
Staphylococcus aureus: NEGATIVE

## 2017-01-08 LAB — HIV ANTIBODY (ROUTINE TESTING W REFLEX): HIV Screen 4th Generation wRfx: NONREACTIVE

## 2017-01-08 SURGERY — IRRIGATION AND DEBRIDEMENT ABSCESS
Anesthesia: General | Site: Neck

## 2017-01-08 MED ORDER — ONDANSETRON HCL 4 MG/2ML IJ SOLN
INTRAMUSCULAR | Status: AC
  Filled 2017-01-08: qty 2

## 2017-01-08 MED ORDER — BUPIVACAINE HCL (PF) 0.25 % IJ SOLN
INTRAMUSCULAR | Status: AC
  Filled 2017-01-08: qty 10

## 2017-01-08 MED ORDER — ROCURONIUM BROMIDE 100 MG/10ML IV SOLN
INTRAVENOUS | Status: DC | PRN
  Administered 2017-01-08: 50 mg via INTRAVENOUS

## 2017-01-08 MED ORDER — SUGAMMADEX SODIUM 200 MG/2ML IV SOLN
INTRAVENOUS | Status: AC
  Filled 2017-01-08: qty 2

## 2017-01-08 MED ORDER — ONDANSETRON HCL 4 MG/2ML IJ SOLN
4.0000 mg | Freq: Four times a day (QID) | INTRAMUSCULAR | Status: DC | PRN
  Administered 2017-01-08: 4 mg via INTRAVENOUS

## 2017-01-08 MED ORDER — SUCCINYLCHOLINE CHLORIDE 20 MG/ML IJ SOLN
INTRAMUSCULAR | Status: DC | PRN
  Administered 2017-01-08: 140 mg via INTRAVENOUS

## 2017-01-08 MED ORDER — FENTANYL CITRATE (PF) 100 MCG/2ML IJ SOLN
INTRAMUSCULAR | Status: DC | PRN
  Administered 2017-01-08: 50 ug via INTRAVENOUS
  Administered 2017-01-08: 100 ug via INTRAVENOUS
  Administered 2017-01-08: 50 ug via INTRAVENOUS

## 2017-01-08 MED ORDER — GLYCOPYRROLATE 0.2 MG/ML IJ SOLN
INTRAMUSCULAR | Status: DC | PRN
  Administered 2017-01-08: 0.2 mg via INTRAVENOUS

## 2017-01-08 MED ORDER — LACTATED RINGERS IV SOLN
INTRAVENOUS | Status: DC | PRN
  Administered 2017-01-08: 07:00:00 via INTRAVENOUS

## 2017-01-08 MED ORDER — PROPOFOL 10 MG/ML IV BOLUS
INTRAVENOUS | Status: AC
  Filled 2017-01-08: qty 20

## 2017-01-08 MED ORDER — EPHEDRINE 5 MG/ML INJ
INTRAVENOUS | Status: AC
  Filled 2017-01-08: qty 10

## 2017-01-08 MED ORDER — PROPOFOL 10 MG/ML IV BOLUS
INTRAVENOUS | Status: DC | PRN
  Administered 2017-01-08: 150 mg via INTRAVENOUS

## 2017-01-08 MED ORDER — METFORMIN HCL ER 500 MG PO TB24
500.0000 mg | ORAL_TABLET | Freq: Three times a day (TID) | ORAL | Status: DC
  Administered 2017-01-08 – 2017-01-09 (×5): 500 mg via ORAL
  Filled 2017-01-08 (×5): qty 1

## 2017-01-08 MED ORDER — INFLUENZA VAC SPLIT QUAD 0.5 ML IM SUSY
0.5000 mL | PREFILLED_SYRINGE | INTRAMUSCULAR | Status: AC
  Administered 2017-01-09: 0.5 mL via INTRAMUSCULAR
  Filled 2017-01-08: qty 0.5

## 2017-01-08 MED ORDER — CLINDAMYCIN PHOSPHATE 900 MG/50ML IV SOLN
900.0000 mg | Freq: Once | INTRAVENOUS | Status: AC
  Administered 2017-01-08: 900 mg via INTRAVENOUS

## 2017-01-08 MED ORDER — LACTATED RINGERS IV SOLN
INTRAVENOUS | Status: DC
  Administered 2017-01-08: 07:00:00 via INTRAVENOUS

## 2017-01-08 MED ORDER — LIDOCAINE 2% (20 MG/ML) 5 ML SYRINGE
INTRAMUSCULAR | Status: DC | PRN
  Administered 2017-01-08: 60 mg via INTRAVENOUS

## 2017-01-08 MED ORDER — LACTATED RINGERS IV SOLN: INTRAVENOUS | Status: DC

## 2017-01-08 MED ORDER — MIDAZOLAM HCL 2 MG/2ML IJ SOLN
INTRAMUSCULAR | Status: AC
  Filled 2017-01-08: qty 2

## 2017-01-08 MED ORDER — MEPERIDINE HCL 25 MG/ML IJ SOLN: 6.2500 mg | INTRAMUSCULAR | Status: DC | PRN

## 2017-01-08 MED ORDER — SUCCINYLCHOLINE CHLORIDE 200 MG/10ML IV SOSY
PREFILLED_SYRINGE | INTRAVENOUS | Status: AC
  Filled 2017-01-08: qty 10

## 2017-01-08 MED ORDER — HYDROCODONE-ACETAMINOPHEN 5-325 MG PO TABS
1.0000 | ORAL_TABLET | Freq: Four times a day (QID) | ORAL | Status: DC | PRN
  Administered 2017-01-08 – 2017-01-09 (×2): 2 via ORAL
  Administered 2017-01-09: 1 via ORAL
  Administered 2017-01-09: 2 via ORAL
  Filled 2017-01-08 (×4): qty 2

## 2017-01-08 MED ORDER — SCOPOLAMINE 1 MG/3DAYS TD PT72
1.0000 | MEDICATED_PATCH | TRANSDERMAL | Status: DC
  Administered 2017-01-08: 1.5 mg via TRANSDERMAL

## 2017-01-08 MED ORDER — ONDANSETRON 4 MG PO TBDP
4.0000 mg | ORAL_TABLET | Freq: Four times a day (QID) | ORAL | Status: DC | PRN
Start: 2017-01-08 — End: 2017-01-08

## 2017-01-08 MED ORDER — DIPHENHYDRAMINE HCL 50 MG/ML IJ SOLN: 12.5000 mg | Freq: Four times a day (QID) | INTRAMUSCULAR | Status: DC | PRN

## 2017-01-08 MED ORDER — ROCURONIUM BROMIDE 10 MG/ML (PF) SYRINGE
PREFILLED_SYRINGE | INTRAVENOUS | Status: AC
  Filled 2017-01-08: qty 5

## 2017-01-08 MED ORDER — DEXAMETHASONE SODIUM PHOSPHATE 4 MG/ML IJ SOLN
INTRAMUSCULAR | Status: DC | PRN
  Administered 2017-01-08: 4 mg via INTRAVENOUS

## 2017-01-08 MED ORDER — FENTANYL CITRATE (PF) 100 MCG/2ML IJ SOLN: 100.0000 ug | Freq: Once | INTRAMUSCULAR | Status: DC

## 2017-01-08 MED ORDER — MIDAZOLAM HCL 5 MG/5ML IJ SOLN
INTRAMUSCULAR | Status: DC | PRN
  Administered 2017-01-08: 2 mg via INTRAVENOUS

## 2017-01-08 MED ORDER — 0.9 % SODIUM CHLORIDE (POUR BTL) OPTIME
TOPICAL | Status: DC | PRN
  Administered 2017-01-08: 1000 mL

## 2017-01-08 MED ORDER — INSULIN ASPART 100 UNIT/ML ~~LOC~~ SOLN
0.0000 [IU] | SUBCUTANEOUS | Status: DC
  Administered 2017-01-08: 3 [IU] via SUBCUTANEOUS

## 2017-01-08 MED ORDER — SODIUM CHLORIDE 0.9 % IV SOLN
INTRAVENOUS | Status: DC
  Administered 2017-01-08: 02:00:00 via INTRAVENOUS

## 2017-01-08 MED ORDER — HYDROCODONE-ACETAMINOPHEN 5-325 MG PO TABS: 1.0000 | ORAL_TABLET | Freq: Four times a day (QID) | ORAL | 0 refills | Status: DC | PRN

## 2017-01-08 MED ORDER — HYDROMORPHONE HCL 1 MG/ML IJ SOLN
1.0000 mg | INTRAMUSCULAR | Status: DC | PRN
  Administered 2017-01-08: 1 mg via INTRAVENOUS
  Filled 2017-01-08: qty 1

## 2017-01-08 MED ORDER — DIPHENHYDRAMINE HCL 12.5 MG/5ML PO ELIX: 12.5000 mg | ORAL_SOLUTION | Freq: Four times a day (QID) | ORAL | Status: DC | PRN

## 2017-01-08 MED ORDER — BUPIVACAINE-EPINEPHRINE (PF) 0.25% -1:200000 IJ SOLN
INTRAMUSCULAR | Status: AC
  Filled 2017-01-08: qty 30

## 2017-01-08 MED ORDER — SUGAMMADEX SODIUM 200 MG/2ML IV SOLN
INTRAVENOUS | Status: DC | PRN
  Administered 2017-01-08: 300 mg via INTRAVENOUS

## 2017-01-08 MED ORDER — ENOXAPARIN SODIUM 40 MG/0.4ML ~~LOC~~ SOLN
40.0000 mg | SUBCUTANEOUS | Status: DC
  Administered 2017-01-08: 40 mg via SUBCUTANEOUS
  Filled 2017-01-08: qty 0.4

## 2017-01-08 MED ORDER — PROMETHAZINE HCL 25 MG/ML IJ SOLN: 6.2500 mg | INTRAMUSCULAR | Status: DC | PRN

## 2017-01-08 MED ORDER — FENTANYL CITRATE (PF) 100 MCG/2ML IJ SOLN
INTRAMUSCULAR | Status: AC
  Administered 2017-01-08: 25 ug via INTRAVENOUS
  Filled 2017-01-08: qty 2

## 2017-01-08 MED ORDER — SCOPOLAMINE 1 MG/3DAYS TD PT72
MEDICATED_PATCH | TRANSDERMAL | Status: AC
  Administered 2017-01-08: 1.5 mg via TRANSDERMAL
  Filled 2017-01-08: qty 1

## 2017-01-08 MED ORDER — FENTANYL CITRATE (PF) 100 MCG/2ML IJ SOLN
25.0000 ug | INTRAMUSCULAR | Status: DC | PRN
  Administered 2017-01-08 (×2): 25 ug via INTRAVENOUS

## 2017-01-08 MED ORDER — PHENYLEPHRINE 40 MCG/ML (10ML) SYRINGE FOR IV PUSH (FOR BLOOD PRESSURE SUPPORT)
PREFILLED_SYRINGE | INTRAVENOUS | Status: AC
  Filled 2017-01-08: qty 10

## 2017-01-08 MED ORDER — CLINDAMYCIN PHOSPHATE 900 MG/50ML IV SOLN
INTRAVENOUS | Status: AC
  Filled 2017-01-08: qty 50

## 2017-01-08 MED ORDER — HYDROMORPHONE HCL 1 MG/ML IJ SOLN: 0.5000 mg | INTRAMUSCULAR | Status: DC | PRN

## 2017-01-08 MED ORDER — HYDROMORPHONE HCL 1 MG/ML IJ SOLN
1.0000 mg | Freq: Once | INTRAMUSCULAR | Status: AC
  Administered 2017-01-08: 1 mg via INTRAVENOUS
  Filled 2017-01-08: qty 1

## 2017-01-08 MED ORDER — DEXAMETHASONE SODIUM PHOSPHATE 10 MG/ML IJ SOLN
INTRAMUSCULAR | Status: AC
  Filled 2017-01-08: qty 1

## 2017-01-08 MED ORDER — LIDOCAINE 2% (20 MG/ML) 5 ML SYRINGE
INTRAMUSCULAR | Status: AC
  Filled 2017-01-08: qty 5

## 2017-01-08 MED ORDER — FENTANYL CITRATE (PF) 250 MCG/5ML IJ SOLN
INTRAMUSCULAR | Status: AC
  Filled 2017-01-08: qty 5

## 2017-01-08 SURGICAL SUPPLY — 28 items
BNDG GAUZE ELAST 4 BULKY (GAUZE/BANDAGES/DRESSINGS) ×2 IMPLANT
CANISTER SUCT 3000ML PPV (MISCELLANEOUS) ×2 IMPLANT
COVER SURGICAL LIGHT HANDLE (MISCELLANEOUS) ×2 IMPLANT
DRAPE LAPAROSCOPIC ABDOMINAL (DRAPES) IMPLANT
DRAPE LAPAROTOMY T 98X78 PEDS (DRAPES) IMPLANT
DRAPE UTILITY XL STRL (DRAPES) ×2 IMPLANT
DRSG PAD ABDOMINAL 8X10 ST (GAUZE/BANDAGES/DRESSINGS) IMPLANT
ELECT CAUTERY BLADE 6.4 (BLADE) ×2 IMPLANT
ELECT REM PT RETURN 9FT ADLT (ELECTROSURGICAL) ×2
ELECTRODE REM PT RTRN 9FT ADLT (ELECTROSURGICAL) ×1 IMPLANT
GAUZE SPONGE 4X4 12PLY STRL (GAUZE/BANDAGES/DRESSINGS) IMPLANT
GAUZE SPONGE 4X4 16PLY XRAY LF (GAUZE/BANDAGES/DRESSINGS) ×2 IMPLANT
GLOVE BIO SURGEON STRL SZ7.5 (GLOVE) ×2 IMPLANT
GLOVE BIOGEL PI IND STRL 8 (GLOVE) ×1 IMPLANT
GLOVE BIOGEL PI INDICATOR 8 (GLOVE) ×1
GOWN STRL REUS W/ TWL LRG LVL3 (GOWN DISPOSABLE) ×1 IMPLANT
GOWN STRL REUS W/ TWL XL LVL3 (GOWN DISPOSABLE) ×1 IMPLANT
GOWN STRL REUS W/TWL LRG LVL3 (GOWN DISPOSABLE) ×1
GOWN STRL REUS W/TWL XL LVL3 (GOWN DISPOSABLE) ×1
KIT BASIN OR (CUSTOM PROCEDURE TRAY) ×2 IMPLANT
KIT ROOM TURNOVER OR (KITS) ×2 IMPLANT
NS IRRIG 1000ML POUR BTL (IV SOLUTION) ×2 IMPLANT
PACK GENERAL/GYN (CUSTOM PROCEDURE TRAY) ×2 IMPLANT
PAD ARMBOARD 7.5X6 YLW CONV (MISCELLANEOUS) ×2 IMPLANT
SWAB COLLECTION DEVICE MRSA (MISCELLANEOUS) IMPLANT
SWAB CULTURE ESWAB REG 1ML (MISCELLANEOUS) IMPLANT
TOWEL OR 17X24 6PK STRL BLUE (TOWEL DISPOSABLE) ×2 IMPLANT
TOWEL OR 17X26 10 PK STRL BLUE (TOWEL DISPOSABLE) ×2 IMPLANT

## 2017-01-08 NOTE — Discharge Instructions (Addendum)
POST OP INSTRUCTIONS  1. DIET: As tolerated  2. Take your usually prescribed home medications unless otherwise directed   3. Pain a. It is helpful to take an over-the-counter pain medication regularly for the first few weeks: i. Ibuprofen (Motrin/Advil) -  tabs - take 3 tabs ( ) every 6 hours as needed for pain ii. Acetaminophen (Tylenol) - you may take  every 6 hours as needed. You can take this with motrin as they act differently on the body. If you are taking a narcotic pain medication that has acetaminophen in it, do not take over the counter tylenol at the same time.  Iii. NOTE: You may take both of these medications together - most patients  find it most helpful when alternating between the two (i.e. Ibuprofen at 6am,  tylenol at 9am, ibuprofen at 12pm ...)  b. A  prescription for pain medication should be given to you upon discharge.  Take your pain medication as prescribed if your pain is not adequatly controlled with the over-the-counter pain reliefs mentioned above.  4. Avoid getting constipated.  Between the surgery and the pain medications, it is common to experience some constipation.  Increasing fluid intake and taking a fiber supplement (such as Metamucil, Citrucel, FiberCon, MiraLax, etc) 1-2 times a day regularly will usually help prevent this problem from occurring.  A mild laxative (prune juice, Milk of Magnesia, MiraLax, etc) should be taken according to package directions if there are no bowel movements after 48 hours.    5. Dressing: Change your dressing twice daily. Remove before shower, let soap/water run through wound and pat dry. Re-'pack' with moist gauze.   6. ACTIVITIES as tolerated  7. Follow-up in our office a. Please call [facility] at [phone] to set up an appointment to see your surgeon in the office for a follow-up appointment approximately 2-3 weeks after your surgery. b. Make sure that you call for this appointment the day you arrive home to  insure a convenient appointment time. 9.  If you have any forms that need to be completed like disability of Family Leave forms, please bring them to our office and one of our staff members will assist you in completing this paperwork. Please do not give them to your doctor  WHEN TO CALL us [phone]: 1. Reactions / problems with new medications (rash/itching, nausea, etc)  2. Fever over 101.5 F (38.5 C) 3. Worsening swelling 4. Continued bleeding from incision. 5. Increased pain, redness, or drainage from the incision   The clinic staff is available to answer your questions during regular business hours (8:30am-5pm).  Please dont hesitate to call and ask to speak to one of our nurses for clinical concerns.   If you have a medical emergency, go to the nearest emergency room or call 911.  A surgeon from [facility] is always on call at the hospitals in [facility], [place] [address], [place], [place]  [address] MAIN: [phone] FAX: [phone] [email or web address]    WOUND CARE: - dressing to be changed once daily - supplies: sterile saline, kerlix, scissors, gauze, tape  - remove dressing and all packing carefully, moistening with sterile saline as needed to avoid packing/internal dressing sticking to the wound. - dampen and clean kerlix with sterile saline and pack wound from wound base to skin level, making sure to take note of any possible areas of wound tracking, tunneling and packing appropriately. Wound can be packed loosely. Trim gauze  to size if a whole piece is not required. - cover wound with a dry gauze and secure with tape.  - write the date/time on the dry dressing/tape to better track when the last dressing change occurred. - change dressing as needed if leakage occurs, wound gets contaminated, or patient requests to shower. - patient may shower daily with wound open and following the shower the  wound should be dried and a clean dressing placed.

## 2017-01-08 NOTE — Anesthesia Procedure Notes (Signed)
Procedure Name: Intubation Date/Time: 01/08/2017 7:43 AM Performed by: Julian Reil Pre-anesthesia Checklist: Patient identified, Emergency Drugs available, Suction available, Patient being monitored and Timeout performed Patient Re-evaluated:Patient Re-evaluated prior to induction Oxygen Delivery Method: Circle system utilized Preoxygenation: Pre-oxygenation with 100% oxygen Induction Type: IV induction Ventilation: Mask ventilation without difficulty Laryngoscope Size: Glidescope and 4 Grade View: Grade I Tube type: Oral Tube size: 7.0 mm Number of attempts: 1 Airway Equipment and Method: Stylet and Video-laryngoscopy Placement Confirmation: ETT inserted through vocal cords under direct vision,  positive ETCO2 and breath sounds checked- equal and bilateral Secured at: 23 cm Tube secured with: Tape Dental Injury: Teeth and Oropharynx as per pre-operative assessment  Comments: 4x4s bite block.

## 2017-01-08 NOTE — Anesthesia Postprocedure Evaluation (Signed)
Anesthesia Post Note  Patient: Jean Berry  Procedure(s) Performed: IRRIGATION AND DEBRIDEMENT ABSCESS POSTERIOR NECK (N/A Neck)     Patient location during evaluation: PACU Anesthesia Type: General Level of consciousness: awake and alert Pain management: pain level controlled Vital Signs Assessment: post-procedure vital signs reviewed and stable Respiratory status: spontaneous breathing, nonlabored ventilation, respiratory function stable and patient connected to nasal cannula oxygen Cardiovascular status: blood pressure returned to baseline and stable Postop Assessment: no apparent nausea or vomiting Anesthetic complications: no    Last Vitals:  Vitals:   01/08/17 0945 01/08/17 1003  BP:  (!) 95/35  Pulse: 88 99  Resp: 12 14  Temp:  36.8 C  SpO2: 95% 90%    Last Pain:  Vitals:   01/08/17 1003  TempSrc: Oral  PainSc:                  Shelton Silvas

## 2017-01-08 NOTE — Progress Notes (Signed)
Subjective No acute events. NPO since yesterday afternoon. Ready for OR  Objective: Vital signs in last 24 hours: Temp:  [98.4 F (36.9 C)-100.3 F (37.9 C)] 99 F (37.2 C) (10/06 0507) Pulse Rate:  [97-110] 97 (10/06 0507) Resp:  [18-19] 18 (10/06 0507) BP: (106-144)/(55-93) 111/58 (10/06 0507) SpO2:  [92 %-100 %] 96 % (10/06 0507) Weight:  [116 kg (255 lb 11.2 oz)-133.4 kg (294 lb)] 116 kg (255 lb 11.2 oz) (10/06 0118) Last BM Date: 01/07/17  Intake/Output from previous day: 10/05 0701 - 10/06 0700 In: 350 [I.V.:300; IV Piggyback:50] Out: -  Intake/Output this shift: No intake/output data recorded.  Gen: NAD, comfortable Neck: Posterior midline lower cervical region with firm indurated area; no drainage; exquisitely tender to palpation CV: RRR Pulm: Normal work of breathing Abd: Soft, NT Ext: SCDs in place  Lab Results: CBC   Recent Labs  01/07/17 1825 01/08/17 0232  WBC 9.1 10.5  HGB 11.8* 11.6*  HCT 35.5* 34.5*  PLT 304 295   BMET  Recent Labs  01/07/17 1825  NA 137  K 3.8  CL 102  CO2 26  GLUCOSE 171*  BUN 7  CREATININE 0.80  CALCIUM 8.7*   PT/INR No results for input(s): LABPROT, INR in the last 72 hours. ABG No results for input(s): PHART, HCO3 in the last 72 hours.  Invalid input(s): PCO2, PO2  Studies/Results:  Anti-infectives: Anti-infectives    Start     Dose/Rate Route Frequency Ordered Stop   01/08/17 0745  clindamycin (CLEOCIN) IVPB 900 mg     900 mg 100 mL/hr over 30 Minutes Intravenous  Once 01/08/17 0733     01/08/17 0731  clindamycin (CLEOCIN) 900 MG/50ML IVPB    Comments:  Shireen Quan   : cabinet override      01/08/17 0731 01/08/17 1944   01/07/17 2345  clindamycin (CLEOCIN) IVPB 600 mg     600 mg 100 mL/hr over 30 Minutes Intravenous Once 01/07/17 2340 01/08/17 0039       Assessment/Plan: -OR today for incision and drainage of posterior neck abscess, all other indicated procedures -The procedure, material risks  (including but not limited to pain, bleeding, recurrence of infection, scarring, need for additional procedures) benefits and alternatives to surgery were described. Her questions were answered to her satisfaction and she elected to proceed with the procedure -Position: Prone  Andria Meuse, MD [facility]

## 2017-01-08 NOTE — Transfer of Care (Signed)
Immediate Anesthesia Transfer of Care Note  Patient: Jean Berry  Procedure(s) Performed: IRRIGATION AND DEBRIDEMENT ABSCESS POSTERIOR NECK (N/A Neck)  Patient Location: PACU  Anesthesia Type:General  Level of Consciousness: drowsy and patient cooperative  Airway & Oxygen Therapy: Patient Spontanous Breathing and Patient connected to face mask oxygen  Post-op Assessment: Report given to RN and Post -op Vital signs reviewed and stable  Post vital signs: Reviewed and stable  Last Vitals:  Vitals:   01/08/17 0118 01/08/17 0507  BP: 112/65 (!) 111/58  Pulse: 100 97  Resp: 18 18  Temp: 37.9 C 37.2 C  SpO2: 96% 96%    Last Pain:  Vitals:   01/08/17 0507  TempSrc: Oral  PainSc:          Complications: No apparent anesthesia complications

## 2017-01-08 NOTE — Anesthesia Preprocedure Evaluation (Addendum)
Anesthesia Evaluation  Patient identified by MRN, date of birth, ID band Patient awake    Reviewed: Allergy & Precautions, NPO status , Patient's Chart, lab work & pertinent test results  Airway Mallampati: IV  TM Distance: <3 FB Neck ROM: Limited    Dental  (+) Teeth Intact, Dental Advisory Given   Pulmonary neg pulmonary ROS,    breath sounds clear to auscultation       Cardiovascular negative cardio ROS   Rhythm:Regular Rate:Normal     Neuro/Psych negative neurological ROS     GI/Hepatic negative GI ROS, Neg liver ROS,   Endo/Other  negative endocrine ROSdiabetes, Type 2, Oral Hypoglycemic Agents  Renal/GU negative Renal ROS     Musculoskeletal  (+) Arthritis ,   Abdominal   Peds  Hematology negative hematology ROS (+)   Anesthesia Other Findings Day of surgery medications reviewed with the patient.  Reproductive/Obstetrics                            Lab Results  Component Value Date   WBC 10.5 01/08/2017   HGB 11.6 (L) 01/08/2017   HCT 34.5 (L) 01/08/2017   MCV 82.9 01/08/2017   PLT 295 01/08/2017   Lab Results  Component Value Date   CREATININE 0.80 01/07/2017   BUN 7 01/07/2017   NA 137 01/07/2017   K 3.8 01/07/2017   CL 102 01/07/2017   CO2 26 01/07/2017   No results found for: INR, PROTIME  EKG: normal sinus rhythm.  Anesthesia Physical Anesthesia Plan  ASA: III  Anesthesia Plan: General   Post-op Pain Management:    Induction: Intravenous and Rapid sequence  PONV Risk Score and Plan: 4 or greater and Ondansetron, Dexamethasone, Midazolam, Scopolamine patch - Pre-op and Treatment may vary due to age or medical condition  Airway Management Planned: Oral ETT and Video Laryngoscope Planned  Additional Equipment:   Intra-op Plan:   Post-operative Plan: Extubation in OR  Informed Consent: I have reviewed the patients History and Physical, chart, labs and  discussed the procedure including the risks, benefits and alternatives for the proposed anesthesia with the patient or authorized representative who has indicated his/her understanding and acceptance.   Dental advisory given  Plan Discussed with: CRNA  Anesthesia Plan Comments:        Anesthesia Quick Evaluation

## 2017-01-08 NOTE — Op Note (Addendum)
01/07/2017 - 01/08/2017  8:18 AM  PATIENT:  Jean Berry  51 y.o. female  Patient Care Team: Arnette Felts as PCP - General (General Practice) Tyrell Antonio, MD as Consulting Physician  PRE-OPERATIVE DIAGNOSIS:  posterior neck abscess  POST-OPERATIVE DIAGNOSIS:  same  PROCEDURE:  Incision and drainage of neck abscess  SURGEON:  Stephanie Coup. Lawayne Hartig, MD  ASSISTANT: none  ANESTHESIA:   general  EBL:  Total I/O In: -  Out: 10 [Blood:10]  DRAINS: none  SPECIMEN: Cultures of neck abscess - aerobe and anaerobic  COUNTS:  Sponge, needle and instrument counts were reported correct x2 at the conclusion of the operation.  PATIENT DISPOSITION:  PACU - hemodynamically stable.  OR FINDINGS: Neck abscess posterior midline; cultures sent. 20cc pus evacuated  INDICATION: [name] is a pleasant 51 year old female who presented to the ED overnigth with a posterior neck abscess. Has had pain in this region for 1.5 months but significant increase in pain in last 2 days. Her WBC was normal. She had a CT scan of her neck which demonstrated a multiloculated abscess in the posterior neck. The anatomy and physiology of the neck was described in detail using pictures/diagrams. The pathophysiology of abscesses was also detailed. The procedure, material risks (including, but not limited to, pain, bleeding, infection, scarring, damage to surrounding structures, need for additional procedures, heart attack, stroke, death), benefits and alternatives were explained. The patient's questions were answered to their satisfaction and they elected to proceed with surgery.  DESCRIPTION: The patient was identified in the preoperative holding area and taken to the OR with SCDs in place. General endotracheal anesthesia was induced without difficulty. The patient was then moved onto the OR table in the prone position. The patient was then prepped and draped in the usual sterile fashion. A surgical timeout was  performed indicating the correct patient, procedure, positioning and need for preoperative antibiotics.   An incision was made in the posterior midline lower neck just caudal to a skin fold in her neck. This was carried down through the subcutaneous tissue until the abscess cavities were entered. This was multiloculated and all loculations/abscess cavities were entered bluntly. Cultures were sent. ~20cc of pus was evacuated. The abscess cavity was again bluntly probed to ensure all loculations/connnections were broken free. The wound edges were landscaped to facilitate drainage. The wound was irrigated with copious amounts of saline. Hemostasis was achieved with electrocautery. The wound was packed with a kerlex, covered with 4x4s and secured with tape. The patient was then placed supine on a stretcher, extubated and transported to PACU in satisfactory condition.  DISPO: PACU, ward  Stephanie Coup. Cliffton Asters, M.D. [facility]

## 2017-01-09 ENCOUNTER — Encounter (HOSPITAL_COMMUNITY): Payer: Self-pay | Admitting: Surgery

## 2017-01-09 MED ORDER — AMOXICILLIN-POT CLAVULANATE 875-125 MG PO TABS: 1.0000 | ORAL_TABLET | Freq: Two times a day (BID) | ORAL | 0 refills | Status: AC

## 2017-01-09 NOTE — Discharge Summary (Signed)
[facility] Discharge Summary   Patient ID: Jean Berry MRN: [id number] DOB/AGE: [birth year] 51 y.o.  Admit date: 01/07/2017 Discharge date: 01/09/2017  Admitting Diagnosis: Neck abscess  Discharge Diagnosis Patient Active Problem List   Diagnosis Date Noted  . Neck abscess 01/08/2017  . Shortness of breath 11/18/2016  . Lower extremity edema 11/18/2016  . Back pain 11/15/2016  . Palpitations 11/15/2016  . Tachycardia 11/15/2016  . Primary osteoarthritis of both knees 07/09/2016  . Diabetes mellitus without complication (HCC)   . Arthritis   . Lumbar spondylosis   . Gout flare   . Thyroid disease     Consultants None  Imaging: Ct Soft Tissue Neck W Contrast  Result Date: 01/07/2017 CLINICAL DATA:  Initial evaluation for pain/ knot at back of neck. EXAM: CT NECK WITH CONTRAST TECHNIQUE: Multidetector CT imaging of the neck was performed using the standard protocol following the bolus administration of intravenous contrast. CONTRAST:  75mL ISOVUE-300 IOPAMIDOL (ISOVUE-300) INJECTION 61% COMPARISON:  None available. FINDINGS: Pharynx and larynx: Oral cavity within normal limits without mass lesion or loculated fluid collection. No acute abnormality about the dentition. Palatine tonsils symmetric and within normal limits bilaterally. Parapharyngeal 5 preserved. Nasopharynx normal. Retropharyngeal soft tissues within normal limits. Epiglottis normal. Vallecula largely effaced by the lingual tonsils. Remainder of the hypopharynx and supraglottic larynx within normal limits. True cords apposed and not well evaluated. Subglottic airway clear. Salivary glands: Salivary glands including the parotid and submandibular glands are within normal limits. 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 8 mm hypodense nodule noted within the right thyroid lobe, of doubtful significance. Right thyroid lobe otherwise unremarkable. Lymph nodes: Mildly prominent right  suboccipital lymph node measures up to 10 mm, likely reactive. No other adenopathy identified within the neck. Vascular: Normal intravascular enhancement seen throughout the neck. Limited intracranial: Unremarkable. Visualized orbits: Visualized globes and orbital soft tissues within normal limits. Mastoids and visualized paranasal sinuses: Visualized paranasal sinuses are clear. Mastoid air cells and middle ear cavities are well pneumatized. Skeleton: No acute osseus abnormality. No worrisome lytic or blastic osseous lesions. Upper chest: Visualized upper chest within normal limits. Partially visualized lungs are clear. Other: There is a loculated rim enhancing collection positioned along the midline of the posterior neck/upper back measuring 2.1 x 3.4 x 3.0 cm (series 3, image 51). Additional 1.1 x 1.0 x 2.5 cm collection positioned immediately to the right (series 3, image 45). These collections may be contiguous with 1 another superiorly. These are positioned within the subcutaneous fat, and extend towards the overlying skin at their superior margins. Associated inflammatory stranding with swelling within the adjacent fat and upper trapezius musculature. Findings consistent with abscess with associated cellulitis. IMPRESSION: Loculated rim enhancing collections within the posterior neck as above, consistent with abscesses. Associated swelling and inflammatory stranding within the adjacent soft tissues consistent with associated cellulitis/infection. Electronically Signed   By: Rise Mu M.D.   On: 01/07/2017 22:33    Procedures Dr. Cliffton Asters (01/08/17) - Incision and drainage of neck abscess  Hospital Course:  Jean Berry is a 51yo female PMH DM who presented to [facility] 10/5 with a 2 day history of neck pain. PMH DM who presented to [facility] 10/5 with a 2 day history of neck pain.  Workup included CT scan which showed 3 small subcutaneous abscesses in the soft tissues superficial to the musculature of the neck.  WBC WNL. Patient was admitted and underwent procedure  listed above.  Tolerated procedure well and was transferred to the floor.  Diet was advanced as tolerated.  On POD1 the patient  was voiding well, tolerating diet, pain well controlled, vital signs stable, incisions c/d/i and felt stable for discharge home.  Patient will continue packing wound loosely at home with saline dampened kerlex. She will go home with 5 days of augmentin. Patient will follow up in our office in 1-2 weeks and knows to call with questions or concerns.  She will call to confirm appointment date/time.     Physical Exam: General:  Alert, NAD, pleasant, comfortable Pulm: effort normal Cardio: RRR Neck: posterior neck abscess s/p I&D with no purulent drainage, trace induration, no surrounding erythema >> packing removed and repacked loosely with saline dampened gauze   Allergies as of 01/09/2017      Reactions   Bee Venom Anaphylaxis   Latex Rash      Medication List    STOP taking these medications   cephALEXin 500 MG capsule Commonly known as:  KEFLEX     TAKE these medications   amoxicillin-clavulanate 875-125 MG tablet Commonly known as:  AUGMENTIN Take 1 tablet by mouth 2 (two) times daily.   BIOFLEX PO Take 1 tablet by mouth daily.   BIOTIN PO Take 1 tablet by mouth daily.   cyclobenzaprine 10 MG tablet Commonly known as:  FLEXERIL Take 10 mg by mouth 3 (three) times daily as needed.   Diclofenac Sodium 2 % Soln Commonly known as:  PENNSAID Place 1 application onto the skin 2 (two) times daily. What changed:  when to take this  reasons to take this   ferrous sulfate 325 (65 FE) MG tablet Take 325 mg by mouth daily with breakfast.   HAIR VITAMINS PO Take 1 tablet by mouth daily.   HYDROcodone-acetaminophen 5-325 MG tablet Commonly known as:  NORCO/VICODIN Take 1 tablet by mouth every 6 (six) hours as needed (severe pain).   loratadine 10 MG tablet Commonly known as:  CLARITIN Take 10 mg by mouth daily.   magnesium 30 MG tablet Take 30  mg by mouth 2 (two) times daily.   meloxicam 15 MG tablet Commonly known as:  MOBIC Take 1 tablet (15 mg total) by mouth daily.   metFORMIN 500 MG 24 hr tablet Commonly known as:  GLUCOPHAGE-XR TAKE 4 TABLETS BY MOUTH EVERY DAY WITH BREAKFAST What changed:  how much to take  when to take this  additional instructions   multivitamin tablet Take 1 tablet by mouth daily.   phentermine 15 MG capsule Take 15 mg by mouth daily.   vitamin C 100 MG tablet Take 100 mg by mouth daily.        Follow-up Information    Andria Meuse, MD. Call in 1 week(s).   Specialty:  General Surgery Why:  Call as soon as you leave the hospital to make a follow up appointment in 1-2 weeks Contact information: [address] [place] [address] [phone]           Signed: Franne Forts, [facility] 01/09/2017, 10:45 AM Pager: [phone] Consults: [phone] Mon-Fri 7:00 am-4:30 pm Sat-Sun 7:00 am-11:30 am

## 2017-01-09 NOTE — Progress Notes (Signed)
D/c to home with son VSS pain 6/10 pain med given. D/c instructions and RX given and explained. dsg changed and sill teachback with pt and son.

## 2017-01-12 NOTE — Addendum Note (Signed)
Addended by: Regis Bill B on: 01/12/2017 09:23 AM   Modules accepted: Orders

## 2017-01-13 LAB — AEROBIC/ANAEROBIC CULTURE (SURGICAL/DEEP WOUND)

## 2017-01-13 LAB — AEROBIC/ANAEROBIC CULTURE W GRAM STAIN (SURGICAL/DEEP WOUND)

## 2017-01-21 ENCOUNTER — Other Ambulatory Visit: Payer: Self-pay | Admitting: Endocrinology

## 2017-01-26 ENCOUNTER — Ambulatory Visit (INDEPENDENT_AMBULATORY_CARE_PROVIDER_SITE_OTHER): Payer: 59

## 2017-01-26 ENCOUNTER — Other Ambulatory Visit: Payer: Self-pay

## 2017-01-26 ENCOUNTER — Ambulatory Visit (HOSPITAL_COMMUNITY): Payer: 59 | Attending: Cardiovascular Disease

## 2017-01-26 DIAGNOSIS — E669 Obesity, unspecified: Secondary | ICD-10-CM | POA: Insufficient documentation

## 2017-01-26 DIAGNOSIS — R600 Localized edema: Secondary | ICD-10-CM | POA: Diagnosis not present

## 2017-01-26 DIAGNOSIS — R0602 Shortness of breath: Secondary | ICD-10-CM

## 2017-01-26 DIAGNOSIS — R002 Palpitations: Secondary | ICD-10-CM

## 2017-01-26 DIAGNOSIS — I517 Cardiomegaly: Secondary | ICD-10-CM | POA: Insufficient documentation

## 2017-01-26 DIAGNOSIS — Z6841 Body Mass Index (BMI) 40.0 and over, adult: Secondary | ICD-10-CM | POA: Insufficient documentation

## 2017-02-04 ENCOUNTER — Telehealth: Payer: Self-pay | Admitting: Sports Medicine

## 2017-02-04 NOTE — Telephone Encounter (Signed)
Paperwork: Form to clarify work restrictions and diagnosis   Paperwork received by [employer][person requesting form]: Lumin   Individual made aware of 3-5 business day turn around (Y/N): yes   Office form(s) completed and placed with paperwork (Y/N): yes   Form location: Rigby's folder  Callback#:[phone]  Unclear on CRM process for this.  -LL

## 2017-02-04 NOTE — Telephone Encounter (Signed)
Spoke with patient and advised that since she was last seen 07/2016 and OV note said that pt denied LE swelling, she would need to schedule an office visit for re-evaluation. Pt advised that d/t her job, she is unable to come in at this time. Will forward to Dr. Berline Choughigby to advise once he returns. Form on Dr. Janeece Riggersigby's desk for review.

## 2017-02-08 NOTE — Telephone Encounter (Signed)
Spoke with patient and faxed form to cardiologist.

## 2017-02-08 NOTE — Telephone Encounter (Signed)
Form needs to be completed by her cardiologist who has been following her for lower extremity edema.

## 2017-03-02 DIAGNOSIS — R221 Localized swelling, mass and lump, neck: Secondary | ICD-10-CM | POA: Diagnosis not present

## 2017-03-03 ENCOUNTER — Telehealth: Payer: Self-pay | Admitting: *Deleted

## 2017-03-03 MED ORDER — METOPROLOL TARTRATE 25 MG PO TABS: ORAL_TABLET | ORAL | 5 refills | Status: DC

## 2017-03-03 NOTE — Telephone Encounter (Signed)
Advised patient, verbalized understanding  Scheduled follow up ov

## 2017-03-03 NOTE — Telephone Encounter (Signed)
-----   Message from Chilton Siiffany Concrete, MD sent at 03/03/2017  4:05 PM EST ----- Occasional early heart beats from the top and bottom chambers of the heart.  This is likely the cause of her palpitations.  Try metoprolol tartrate 12.5mg  bid or OK to wait and discuss at follow up.

## 2017-03-09 ENCOUNTER — Telehealth: Payer: Self-pay | Admitting: Cardiovascular Disease

## 2017-03-09 NOTE — Telephone Encounter (Signed)
Returned call to patient. Patient wondering if a form was received for Dr. Duke Salviaandolph to review and sign from [facility] in regards to leg swelling.  Patient is trying to start donating plasma and they need this in order to do so.  Advised I would follow up with primary nurse and if not received will call back to have them refax it.  Patient aware and verbalized understanding.

## 2017-03-09 NOTE — Telephone Encounter (Signed)
Spoke with patient and advised no form received as of today. Patient will have it resent

## 2017-03-09 NOTE — Telephone Encounter (Signed)
Patient calling, to see if Dr. Duke Salviaandolph had received a document to fill out stating that patient did not have an order to wear TED stockings.The document is supposed to go to the [facility] center.

## 2017-03-14 ENCOUNTER — Ambulatory Visit: Payer: 59 | Admitting: Cardiovascular Disease

## 2017-03-23 ENCOUNTER — Ambulatory Visit (INDEPENDENT_AMBULATORY_CARE_PROVIDER_SITE_OTHER): Payer: 59 | Admitting: Cardiovascular Disease

## 2017-03-23 ENCOUNTER — Encounter: Payer: Self-pay | Admitting: Cardiovascular Disease

## 2017-03-23 VITALS — BP 116/75 | HR 80 | Ht 66.0 in | Wt 293.0 lb

## 2017-03-23 DIAGNOSIS — G4730 Sleep apnea, unspecified: Secondary | ICD-10-CM

## 2017-03-23 DIAGNOSIS — G4733 Obstructive sleep apnea (adult) (pediatric): Secondary | ICD-10-CM

## 2017-03-23 DIAGNOSIS — R0602 Shortness of breath: Secondary | ICD-10-CM

## 2017-03-23 DIAGNOSIS — R002 Palpitations: Secondary | ICD-10-CM | POA: Diagnosis not present

## 2017-03-23 DIAGNOSIS — I493 Ventricular premature depolarization: Secondary | ICD-10-CM | POA: Diagnosis not present

## 2017-03-23 MED ORDER — METOPROLOL TARTRATE 25 MG PO TABS: 25.0000 mg | ORAL_TABLET | Freq: Two times a day (BID) | ORAL | 3 refills | Status: DC

## 2017-03-23 NOTE — Progress Notes (Signed)
Cardiology Office Note   Date:  03/23/2017   ID:  Shakeita Vandevander, DOB 05/17/1965, MRN [id number]  PCP:  Arnette Felts  Cardiologist:   Chilton Si, MD   No chief complaint on file.    History of Present Illness: Jean Berry is a 51 y.o. female with diabetes and OSA who is being seen today for follow-up.  She was initially seen 11/2016 for the evaluation of palpitations and upper back pain at the request of Arnette Felts, FNP.  Jean Berry saw Arnette Felts, FNP on 10/11/16. At that time she reported discomfort between her shoulder blades. EKG revealed sinus tachycardia at 101 bpm. Basic metabolic panel, CBC, and thyroid function were unremarkable.  At her appointment she reported palpitations as well as lower extremity edema.  By physical exam it seemed as though she had edema but no heart failure.  She was referred for an echocardiogram 01/26/17 that revealed LVEF 55% with moderate LVH and grade 1 diastolic dysfunction.  She wore a 48-hour Holter monitor that showed an average heart rate of 96 bpm with occasional PACs and PVCs.  It was recommended that she try metoprolol tartrate.  Since starting metoprolol Jean Berry has been feeling much better.  Her palpitations are less frequent.  She has them 2 or 3 times to per day but they last for a few seconds.  She is not bothered by them.  She has not noted palpitations with exertion.  She denies lower extremity edema, orthopnea, or PND.  She notes that in the past she had a in facility sleep study that showed obstructive sleep apnea.  She has used the CPAP machine.  However, upon moving to [place] in 2017 her insurance required her to have a in home study that revealed normal oxygen saturations.  She was instructed that she no longer needed the machine.  However, she continues to have headaches whenever she does not use it.  Therefore she uses her old machine.  She reports snoring heavily and waking herself up with apneic episodes.  On  her Holter monitor she was noted to have pauses slightly greater than 2 seconds.  Her exercise is also been limited by bilateral knee pain, and she has severe osteoarthritis.  Past Medical History:  Diagnosis Date  . Arthritis   . Diabetes mellitus without complication (HCC)   . Gout flare   . Lower extremity edema 11/18/2016  . Osteoarthritis of back   . Palpitations   . Shortness of breath 11/18/2016  . Thyroid disease    left lobect approx 2012--benign per pt    Past Surgical History:  Procedure Laterality Date  . COLONOSCOPY    . GALLBLADDER SURGERY    . IRRIGATION AND DEBRIDEMENT ABSCESS N/A 01/08/2017   Procedure: IRRIGATION AND DEBRIDEMENT ABSCESS POSTERIOR NECK;  Surgeon: Andria Meuse, MD;  Location: [facility] OR;  Service: General;  Laterality: N/A;  . OVARIAN CYST SURGERY Right   . THYROID SURGERY Left   . TONSILECTOMY, ADENOIDECTOMY, BILATERAL MYRINGOTOMY AND TUBES    . TUBAL LIGATION    . UPPER GASTROINTESTINAL ENDOSCOPY       Current Outpatient Medications  Medication Sig Dispense Refill  . Ascorbic Acid (VITAMIN C) 100 MG tablet Take 100 mg by mouth daily.    Marland Kitchen Bioflavonoid Products (BIOFLEX PO) Take 1 tablet by mouth daily.    Marland Kitchen BIOTIN PO Take 1 tablet by mouth daily.    . cyclobenzaprine (FLEXERIL) 10 MG tablet Take 10 mg by mouth  3 (three) times daily as needed.  0  . Diclofenac Sodium (PENNSAID) 2 % SOLN Place 1 application onto the skin 2 (two) times daily. (Patient taking differently: Place 1 application onto the skin as needed. ) 112 g 2  . ferrous sulfate 325 (65 FE) MG tablet Take 325 mg by mouth daily with breakfast.    . loratadine (CLARITIN) 10 MG tablet Take 10 mg by mouth daily.  2  . magnesium 30 MG tablet Take 30 mg by mouth 2 (two) times daily.    . metFORMIN (GLUCOPHAGE-XR) 500 MG 24 hr tablet Take 4 tablets by mouth daily with breakfast 120 tablet 2  . metoprolol tartrate (LOPRESSOR) 25 MG tablet Take 1 tablet (25 mg total) by mouth 2 (two) times  daily. 180 tablet 3  . Multiple Vitamin (MULTIVITAMIN) tablet Take 1 tablet by mouth daily.    . Multiple Vitamins-Minerals (HAIR VITAMINS PO) Take 1 tablet by mouth daily.    . phentermine (ADIPEX-P) 37.5 MG tablet Take 37.5 mg by mouth daily.   0   No current facility-administered medications for this visit.     Allergies:   Bee venom and Latex    Social History:  The patient  reports that  has never smoked. she has never used smokeless tobacco. She reports that she does not drink alcohol or use drugs.   Family History:  The patient's family history includes Diabetes in her maternal grandmother; Heart attack in her maternal grandmother; Heart disease in her mother; Lung cancer in her mother.    ROS:  Please see the history of present illness.   Otherwise, review of systems are positive for none.   All other systems are reviewed and negative.    PHYSICAL EXAM: VS:  BP 116/75   Pulse 80   Ht 5\' 6"  (1.676 m)   Wt 293 lb (132.9 kg)   SpO2 100%   BMI 47.29 kg/m  , BMI Body mass index is 47.29 kg/m. GENERAL:  Well appearing HEENT: Pupils equal round and reactive, fundi not visualized, oral mucosa unremarkable NECK:  No jugular venous distention, waveform within normal limits, carotid upstroke brisk and symmetric, no bruits, no thyromegaly LUNGS:  Clear to auscultation bilaterally HEART:  RRR.  PMI not displaced or sustained,S1 and S2 within normal limits, no S3, no S4, no clicks, no rubs, no murmurs ABD:  Flat, positive bowel sounds normal in frequency in pitch, no bruits, no rebound, no guarding, no midline pulsatile mass, no hepatomegaly, no splenomegaly EXT:  2 plus pulses throughout, no edema, no cyanosis no clubbing SKIN:  No rashes no nodules NEURO:  Cranial nerves II through XII grossly intact, motor grossly intact throughout PSYCH:  Cognitively intact, oriented to person place and time   EKG:  EKG is not ordered today. The ekg ordered 11/18/16 demonstrates sinus rhythm.  Rate 91 bpm.  48 Hour Holter Monitor 01/26/17  Quality: Fair.  Baseline artifact. Predominant rhythm: sinus rhythm, sinus arrhythmia Average heart rate: 96 bpm Max heart rate: 146 bpm Min heart rate: 49 bpm One 2.1s pause  Occasional PACs and PVCs noted  Echo 01/26/17: Study Conclusions  - Left ventricle: The cavity size was normal. Wall thickness was   increased in a pattern of moderate LVH. Systolic function was   normal. The estimated ejection fraction was in the range of 55%   to 60%. Wall motion was normal; there were no regional wall   motion abnormalities. Doppler parameters are consistent with   abnormal  left ventricular relaxation (grade 1 diastolic   dysfunction).   Recent Labs: 01/07/2017: ALT 27; BUN 7; Creatinine, Ser 0.80; Potassium 3.8; Sodium 137 01/08/2017: Hemoglobin 11.6; Platelets 295   10/11/16: Sodium 144, potassium 4.3, BUN 8, creatinine 0.78 WBC 9.3, hemoglobin 11.9, hematocrit 36.3, platelets 368 Hemoglobin A1c 6.6% TSH 3.1  Lipid Panel No results found for: CHOL, TRIG, HDL, CHOLHDL, VLDL, LDLCALC, LDLDIRECT    Wt Readings from Last 3 Encounters:  03/23/17 293 lb (132.9 kg)  01/08/17 255 lb 11.2 oz (116 kg)  11/18/16 294 lb 9.6 oz (133.6 kg)      ASSESSMENT AND PLAN:  # PVCs: Thyroid testing, CBC, and BMP have been unremarkable. Symtpoms better since adding metoprolol but still present multiple times per day.  Increase metoprolol to 25mg  bid.  # Shortness of breath: # LE Edema: Echo results showed normal systolic function and grade 1 diastolic dysfunction.  She does have LVH with an unclear cause.  No significant arrhythmia on Holter monitor.  Blood pressures have been very mildly elevated in the past but not enough to explain her hypertrophy.  Will discuss at follow up.  She has no evidence of heart failure on exam.    # OSA: Jean Berry was previously diagnosed with OSA and has a CPAP.  WHen she moved to [place] she had an in home study that  was reportedly negative.  However whenever she sleeps without her CPAP she has headaches.  She was noted to have pauses greater than 2 seconds on telemetry.  I suspect that she does truly have sleep apnea.  She reports snoring loudly and not feeling well when she wakes up in the morning.  We will request an in facility study.   Current medicines are reviewed at length with the patient today.  The patient does not have concerns regarding medicines.  The following changes have been made:   increase metoprolol to 25 mg twice daily  Labs/ tests ordered today include:   Orders Placed This Encounter  Procedures  . Split night study     Disposition:   FU with Tallie Dodds C. Duke Salviaandolph, MD, [facility] in 6 months   This note was written with the assistance of speech recognition software.  Please excuse any transcriptional errors.  Signed, Domanick Cuccia C. Duke Salviaandolph, MD, [facility]  03/23/2017 10:00 AM    [facility]

## 2017-03-23 NOTE — Patient Instructions (Signed)
Medication Instructions:  INCREASE YOUR METOPROLOL TO 25 MG TWICE A DAY   Labwork: NONE  Testing/Procedures: Your physician has recommended that you have a sleep study. This test records several body functions during sleep, including: brain activity, eye movement, oxygen and carbon dioxide blood levels, heart rate and rhythm, breathing rate and rhythm, the flow of air through your mouth and nose, snoring, body muscle movements, and chest and belly movement. WILL CALL YOU WITH APPOINTMENT DATE AND TIME   Follow-Up: Your physician wants you to follow-up in: 6 MONTH OV You will receive a reminder letter in the mail two months in advance. If you don't receive a letter, please call our office to schedule the follow-up appointment.  If you need a refill on your cardiac medications before your next appointment, please call your pharmacy.

## 2017-03-24 NOTE — Telephone Encounter (Signed)
Patient seen in clinic 12/19 no mention of forms at that time

## 2017-03-31 DIAGNOSIS — E119 Type 2 diabetes mellitus without complications: Secondary | ICD-10-CM | POA: Insufficient documentation

## 2017-03-31 DIAGNOSIS — I10 Essential (primary) hypertension: Secondary | ICD-10-CM | POA: Diagnosis not present

## 2017-03-31 DIAGNOSIS — Z9989 Dependence on other enabling machines and devices: Secondary | ICD-10-CM

## 2017-03-31 DIAGNOSIS — G4733 Obstructive sleep apnea (adult) (pediatric): Secondary | ICD-10-CM | POA: Insufficient documentation

## 2017-03-31 DIAGNOSIS — Z6841 Body Mass Index (BMI) 40.0 and over, adult: Secondary | ICD-10-CM | POA: Insufficient documentation

## 2017-04-22 ENCOUNTER — Telehealth: Payer: Self-pay | Admitting: Cardiovascular Disease

## 2017-04-22 NOTE — Telephone Encounter (Signed)
New Message  Pt call requesting to speak with RN to see if paperwork from [facility] was received. Please call back to discuss

## 2017-04-22 NOTE — Telephone Encounter (Signed)
Patient calling to see if paperwork from the [facility] have been received yet. Message will be routed to the primary's nurse.

## 2017-04-25 NOTE — Telephone Encounter (Signed)
Advised patient nothing received at this time. Stated she would have it refaxed

## 2017-05-04 ENCOUNTER — Other Ambulatory Visit: Payer: Self-pay | Admitting: Endocrinology

## 2017-05-06 ENCOUNTER — Other Ambulatory Visit: Payer: Self-pay

## 2017-05-06 NOTE — Telephone Encounter (Signed)
Please refill x 1 Ov is due  

## 2017-05-06 NOTE — Telephone Encounter (Signed)
Patient hasn't been seen since 12/2015 ok to fill?

## 2017-05-09 NOTE — Telephone Encounter (Signed)
Patient needs appointment for any further refills.

## 2017-05-12 DIAGNOSIS — Z713 Dietary counseling and surveillance: Secondary | ICD-10-CM | POA: Diagnosis not present

## 2017-05-12 DIAGNOSIS — E119 Type 2 diabetes mellitus without complications: Secondary | ICD-10-CM | POA: Diagnosis not present

## 2017-05-16 DIAGNOSIS — Z7189 Other specified counseling: Secondary | ICD-10-CM | POA: Diagnosis not present

## 2017-05-16 DIAGNOSIS — Z6841 Body Mass Index (BMI) 40.0 and over, adult: Secondary | ICD-10-CM | POA: Diagnosis not present

## 2017-05-19 DIAGNOSIS — G4733 Obstructive sleep apnea (adult) (pediatric): Secondary | ICD-10-CM | POA: Diagnosis not present

## 2017-05-19 DIAGNOSIS — E119 Type 2 diabetes mellitus without complications: Secondary | ICD-10-CM | POA: Diagnosis not present

## 2017-05-20 DIAGNOSIS — R7303 Prediabetes: Secondary | ICD-10-CM | POA: Diagnosis not present

## 2017-05-25 ENCOUNTER — Other Ambulatory Visit: Payer: Self-pay | Admitting: Student

## 2017-05-25 DIAGNOSIS — M542 Cervicalgia: Secondary | ICD-10-CM

## 2017-05-25 DIAGNOSIS — L0291 Cutaneous abscess, unspecified: Secondary | ICD-10-CM

## 2017-05-27 ENCOUNTER — Ambulatory Visit (HOSPITAL_BASED_OUTPATIENT_CLINIC_OR_DEPARTMENT_OTHER): Payer: 59 | Attending: Cardiovascular Disease | Admitting: Cardiovascular Disease

## 2017-05-27 VITALS — Ht 66.0 in | Wt 290.0 lb

## 2017-05-27 DIAGNOSIS — G4733 Obstructive sleep apnea (adult) (pediatric): Secondary | ICD-10-CM | POA: Insufficient documentation

## 2017-05-27 DIAGNOSIS — G4730 Sleep apnea, unspecified: Secondary | ICD-10-CM

## 2017-05-30 ENCOUNTER — Ambulatory Visit
Admission: RE | Admit: 2017-05-30 | Discharge: 2017-05-30 | Disposition: A | Discharge status: Home / Self Care | Payer: 59 | Source: Ambulatory Visit | Attending: Student | Admitting: Student

## 2017-05-30 DIAGNOSIS — L0291 Cutaneous abscess, unspecified: Secondary | ICD-10-CM

## 2017-05-30 DIAGNOSIS — R221 Localized swelling, mass and lump, neck: Secondary | ICD-10-CM | POA: Diagnosis not present

## 2017-05-30 DIAGNOSIS — M542 Cervicalgia: Secondary | ICD-10-CM

## 2017-06-02 ENCOUNTER — Other Ambulatory Visit: Payer: Self-pay | Admitting: Student

## 2017-06-02 DIAGNOSIS — L0291 Cutaneous abscess, unspecified: Secondary | ICD-10-CM

## 2017-06-07 ENCOUNTER — Encounter (HOSPITAL_BASED_OUTPATIENT_CLINIC_OR_DEPARTMENT_OTHER): Payer: 59

## 2017-06-11 ENCOUNTER — Encounter (HOSPITAL_BASED_OUTPATIENT_CLINIC_OR_DEPARTMENT_OTHER): Payer: Self-pay | Admitting: Cardiovascular Disease

## 2017-06-11 NOTE — Procedures (Signed)
Patient Name: Jean Berry, Jean Berry Date: 05/27/2017 Gender: Female D.O.B: 1965/05/24 Age (years): 51 Referring Provider: Chilton Si Height (inches): 66 Interpreting Physician: Nicki Guadalajara MD, ABSM Weight (lbs): 290 RPSGT: Cherylann Parr BMI: 47 MRN: [id number] Neck Size: 17.50  CLINICAL INFORMATION Sleep Study Type: NPSG  Indication for sleep study: Obesity, OSA, Snoring, Witnessed Apneas  Epworth Sleepiness Score: 1  SLEEP STUDY TECHNIQUE As per the AASM Manual for the Scoring of Sleep and Associated Events v2.3 (April 2016) with a hypopnea requiring 4% desaturations.  The channels recorded and monitored were frontal, central and occipital EEG, electrooculogram (EOG), submentalis EMG (chin), nasal and oral airflow, thoracic and abdominal wall motion, anterior tibialis EMG, snore microphone, electrocardiogram, and pulse oximetry.  MEDICATIONS     Ascorbic Acid (VITAMIN C) 100 MG tablet         Bioflavonoid Products (BIOFLEX PO)         BIOTIN PO         cyclobenzaprine (FLEXERIL) 10 MG tablet         Diclofenac Sodium (PENNSAID) 2 % SOLN         ferrous sulfate 325 (65 FE) MG tablet         loratadine (CLARITIN) 10 MG tablet         magnesium 30 MG tablet         metFORMIN (GLUCOPHAGE-XR) 500 MG 24 hr tablet         metoprolol tartrate (LOPRESSOR) 25 MG tablet         Multiple Vitamin (MULTIVITAMIN) tablet         Multiple Vitamins-Minerals (HAIR VITAMINS PO)         phentermine (ADIPEX-P) 37.5 MG tablet      Medications self-administered by patient taken the night of the study : METFORMIN, METOPROLOL  SLEEP ARCHITECTURE The study was initiated at 10:07:58 PM and ended at 4:44:34 AM.  Sleep onset time was 40.1 minutes and the sleep efficiency was 75.1%%. The total sleep time was 298.0 minutes. Wake after sleep onset (WASO) was 58.5 minutes  Stage REM latency was 104.0 minutes.  The patient spent 6.2%% of the night in stage N1 sleep, 80.4%% in stage  N2 sleep, 0.0%% in stage N3 and 13.42% in REM.  Alpha intrusion was absent.  Supine sleep was 60.23%.  RESPIRATORY PARAMETERS The overall apnea/hypopnea index (AHI) was 19.7 per hour. There were 2 total apneas, including 2 obstructive, 0 central and 0 mixed apneas. There were 96 hypopneas and 15 RERAs.  The AHI during Stage REM sleep was 57.0 per hour.  AHI while supine was 23.1 per hour.  The mean oxygen saturation was 94.4%. The minimum SpO2 during sleep was 65.0%.  Loud snoring was noted during this study.  CARDIAC DATA The 2 lead EKG demonstrated sinus rhythm. The mean heart rate was 80.6 beats per minute. Other EKG findings include: None.  LEG MOVEMENT DATA The total PLMS were 0 with a resulting PLMS index of 0.0. Associated arousal with leg movement index was 0.0 .  IMPRESSIONS - Moderate obstructive sleep apnea overall (AHI 19.7/h); however, severe OSA during REM slep (AHI 57.0/h) - No significant central sleep apnea occurred during this study (CAI = 0.0/h). - Severe oxygen desaturation to a nadir of 85% with NREM sleep and 65% during REM sleep. - The patient snored with loud snoring volume. - No cardiac abnormalities were noted during this study. - Clinically significant periodic limb movements did not occur during sleep. No significant associated  arousals.  DIAGNOSIS - Obstructive Sleep Apnea (327.23 [G47.33 ICD-10]) - Nocturnal Hypoxemia (327.26 [G47.36 ICD-10])  RECOMMENDATIONS - Therapeutic CPAP titration to determine optimal pressure required to alleviate sleep disordered breathing. - Efforts should be made to optimize nasa and oropharyngeal patency. -  Avoid alcohol, sedatives and other CNS depressants that may worsen sleep apnea and disrupt normal sleep architecture. - Sleep hygiene should be reviewed to assess factors that may improve sleep quality. - Weight management (BMI 47) and regular exercise should be initiated.  [Electronically signed] 06/11/2017  04:44 PM  Nicki Guadalajarahomas Jacoby Ritsema MD, [facility], ABSM Diplomate, American Board of Sleep Medicine   NPI: [id number] [facility] PH: [phone]   FX: [phone]) [phone] ACCREDITED BY THE AMERICAN ACADEMY OF SLEEP MEDICINE

## 2017-06-13 ENCOUNTER — Telehealth: Payer: Self-pay | Admitting: *Deleted

## 2017-06-13 ENCOUNTER — Other Ambulatory Visit: Payer: Self-pay | Admitting: Cardiovascular Disease

## 2017-06-13 DIAGNOSIS — G4733 Obstructive sleep apnea (adult) (pediatric): Secondary | ICD-10-CM

## 2017-06-13 DIAGNOSIS — R000 Tachycardia, unspecified: Secondary | ICD-10-CM

## 2017-06-13 DIAGNOSIS — R0602 Shortness of breath: Secondary | ICD-10-CM

## 2017-06-13 DIAGNOSIS — R002 Palpitations: Secondary | ICD-10-CM

## 2017-06-13 NOTE — Telephone Encounter (Signed)
-----   Message from Lennette Biharihomas A Kelly, MD sent at 06/11/2017  4:47 PM EST ----- Burna MortimerWanda; please notify pt of the results and schedule for CPAP titration study.

## 2017-06-13 NOTE — Telephone Encounter (Signed)
PA request sent to [facility] for CPAP titration study.

## 2017-06-13 NOTE — Telephone Encounter (Signed)
Patient notified of sleep study results and recommendations. Pre cert to for a CPAP titration study will be done. Patient informed once approval received titration study will be scheduled. She verbally voiced her understanding of results and recommendations. During the conversation patient mentions that she has a old mask that where the hose around the mouthpiece is "leaking" at night. I asked for her to come by to pick up a sample mask that maybe can help her until she completes the process to get a new machine with supplies.

## 2017-06-15 ENCOUNTER — Telehealth: Payer: Self-pay | Admitting: *Deleted

## 2017-06-15 NOTE — Telephone Encounter (Signed)
Placed  a follow up PR call to patient  inquiring how she has been sleeping since being provided with a sample mask and tubing. Patient reports that she is waking up feeling more rested and no longer waking up with a headache. She was very appreciative of the samples. I told her that once I hear back from her insurance company her CPAP titration study will be scheduled. Patient voiced her understanding of the process.

## 2017-06-16 ENCOUNTER — Ambulatory Visit
Admission: RE | Admit: 2017-06-16 | Discharge: 2017-06-16 | Disposition: A | Discharge status: Home / Self Care | Payer: 59 | Source: Ambulatory Visit | Attending: Student | Admitting: Student

## 2017-06-16 DIAGNOSIS — L0291 Cutaneous abscess, unspecified: Secondary | ICD-10-CM

## 2017-06-16 DIAGNOSIS — L0211 Cutaneous abscess of neck: Secondary | ICD-10-CM | POA: Diagnosis not present

## 2017-06-16 MED ORDER — IOPAMIDOL (ISOVUE-300) INJECTION 61%
75.0000 mL | Freq: Once | INTRAVENOUS | Status: AC | PRN
  Administered 2017-06-16: 75 mL via INTRAVENOUS

## 2017-06-21 ENCOUNTER — Telehealth: Payer: Self-pay | Admitting: *Deleted

## 2017-06-21 NOTE — Telephone Encounter (Signed)
Patient notified insurance approved her CPAP titration study and it has been scheduled for 07/04/17.

## 2017-07-04 ENCOUNTER — Encounter (HOSPITAL_BASED_OUTPATIENT_CLINIC_OR_DEPARTMENT_OTHER): Payer: 59

## 2017-07-05 ENCOUNTER — Other Ambulatory Visit: Payer: Self-pay | Admitting: Endocrinology

## 2017-07-08 ENCOUNTER — Ambulatory Visit (HOSPITAL_BASED_OUTPATIENT_CLINIC_OR_DEPARTMENT_OTHER): Payer: 59 | Attending: Cardiovascular Disease | Admitting: Cardiovascular Disease

## 2017-07-08 DIAGNOSIS — R0602 Shortness of breath: Secondary | ICD-10-CM

## 2017-07-08 DIAGNOSIS — Z7984 Long term (current) use of oral hypoglycemic drugs: Secondary | ICD-10-CM | POA: Insufficient documentation

## 2017-07-08 DIAGNOSIS — R000 Tachycardia, unspecified: Secondary | ICD-10-CM | POA: Diagnosis not present

## 2017-07-08 DIAGNOSIS — R002 Palpitations: Secondary | ICD-10-CM | POA: Diagnosis not present

## 2017-07-08 DIAGNOSIS — Z79899 Other long term (current) drug therapy: Secondary | ICD-10-CM | POA: Diagnosis not present

## 2017-07-08 DIAGNOSIS — G4733 Obstructive sleep apnea (adult) (pediatric): Secondary | ICD-10-CM | POA: Insufficient documentation

## 2017-07-09 NOTE — Telephone Encounter (Signed)
Please refill x 1 Ov is due  

## 2017-07-31 ENCOUNTER — Encounter (HOSPITAL_BASED_OUTPATIENT_CLINIC_OR_DEPARTMENT_OTHER): Payer: Self-pay | Admitting: Cardiovascular Disease

## 2017-07-31 NOTE — Procedures (Signed)
Patient Name: Jean Berry, Jean Berry Date: 07/08/2017 Gender: Female D.O.B: [birth year] Age (years): 74 Referring Provider: Nicki Guadalajara MD, ABSM Height (inches): 66 Interpreting Physician: Nicki Guadalajara MD, ABSM Weight (lbs): 290 RPSGT: Rolene Arbour BMI: 47 MRN: [id number] Neck Size: 17.50  CLINICAL INFORMATION The patient is referred for a CPAP titration to treat sleep apnea.  Date of NPSG:  05/27/2017:  AHI 19.7/h; RDI 22.8/h; REM AHI 57.0/h: AHI supine 23.1: O2 nadir to 65%.  SLEEP STUDY TECHNIQUE As per the AASM Manual for the Scoring of Sleep and Associated Events v2.3 (April 2016) with a hypopnea requiring 4% desaturations.  The channels recorded and monitored were frontal, central and occipital EEG, electrooculogram (EOG), submentalis EMG (chin), nasal and oral airflow, thoracic and abdominal wall motion, anterior tibialis EMG, snore microphone, electrocardiogram, and pulse oximetry. Continuous positive airway pressure (CPAP) was initiated at the beginning of the study and titrated to treat sleep-disordered breathing.  MEDICATIONS     Ascorbic Acid (VITAMIN C) 100 MG tablet         Bioflavonoid Products (BIOFLEX PO)         BIOTIN PO         cyclobenzaprine (FLEXERIL) 10 MG tablet         Diclofenac Sodium (PENNSAID) 2 % SOLN         ferrous sulfate 325 (65 FE) MG tablet         loratadine (CLARITIN) 10 MG tablet         magnesium 30 MG tablet         metFORMIN (GLUCOPHAGE-XR) 500 MG 24 hr tablet         metoprolol tartrate (LOPRESSOR) 25 MG tablet         Multiple Vitamin (MULTIVITAMIN) tablet         Multiple Vitamins-Minerals (HAIR VITAMINS PO)         phentermine (ADIPEX-P) 37.5 MG tablet      Medications self-administered by patient taken the night of the study : METFORMIN, METOPROLOL  TECHNICIAN COMMENTS Comments added by technician: NONE  RESPIRATORY PARAMETERS Optimal PAP Pressure (cm): 12 AHI at Optimal Pressure (/hr): 0.3 Overall Minimal O2  (%): 89.0 Supine % at Optimal Pressure (%): 44 Minimal O2 at Optimal Pressure (%): 89.0   SLEEP ARCHITECTURE The study was initiated at 10:26:49 PM and ended at 4:41:05 AM.  Sleep onset time was 11.9 minutes and the sleep efficiency was 89.6%%. The total sleep time was 335.3 minutes.  The patient spent 1.8%% of the night in stage N1 sleep, 63.8%% in stage N2 sleep, 0.0%% in stage N3 and 34.44% in REM.Stage REM latency was 47.5 minutes  Wake after sleep onset was 27.0. Alpha intrusion was absent. Supine sleep was 63.52%.  CARDIAC DATA The 2 lead EKG demonstrated sinus rhythm. The mean heart rate was 70.4 beats per minute. Other EKG findings include: None.  LEG MOVEMENT DATA The total Periodic Limb Movements of Sleep (PLMS) were 0. The PLMS index was 0.0. A PLMS index of <15 is considered normal in adults.  IMPRESSIONS - The optimal PAP pressure was 12 cm of water. At 12 cm, the patient slept for 2 hours 45 minutes of which 53 minutes was in REM sleep.  AHI was 0.3/h. - Central sleep apnea was not noted during this titration (CAI = 0.2/h). - Mild oxygen desaturations to a nadir of 89%. - The patient snored with moderate snoring volume during this titration study. - No cardiac abnormalities were observed during this  study. - Clinically significant periodic limb movements were not noted during this study. Arousals associated with PLMs were rare.  DIAGNOSIS - Obstructive Sleep Apnea (327.23 [G47.33 ICD-10])  RECOMMENDATIONS - Recommend an initial trial of CPAP therapy with EPR of 3 at 12 cm H2O with heated humidification.  A Small size Fisher[employer]Paykel Full Face Mask Simplus mask was used for the titration study. - Effort should be made to optimize nasal and oral pharyngeal patency. - Avoid alcohol, sedatives and other CNS depressants that may worsen sleep apnea and disrupt normal sleep architecture. - Sleep hygiene should be reviewed to assess factors that may improve sleep quality. -  Weight management (BMI 47) and regular exercise should be initiated or continued. - Recommend a download be obtained in 30 days and sleep clinic evaluation after 4 weeks of therapy.  [Electronically signed] 07/31/2017 10:04 AM  Nicki Guadalajara MD, [facility], ABSM Diplomate, American Board of Sleep Medicine   NPI: [id number] [facility] PH: [phone]   FX: [phone] ACCREDITED BY THE AMERICAN ACADEMY OF SLEEP MEDICINE

## 2017-08-01 ENCOUNTER — Other Ambulatory Visit: Payer: Self-pay | Admitting: Endocrinology

## 2017-08-02 ENCOUNTER — Telehealth: Payer: Self-pay | Admitting: *Deleted

## 2017-08-02 NOTE — Telephone Encounter (Signed)
Faxed CPAP referral to [facility] for set up. 

## 2017-08-02 NOTE — Telephone Encounter (Signed)
Patient notified referral sent to [facility] for CPAP set up. If she has not heard from them in 2 weeks call me back to inform.

## 2017-08-31 ENCOUNTER — Telehealth: Payer: Self-pay | Admitting: *Deleted

## 2017-08-31 NOTE — Telephone Encounter (Signed)
Received a fax from [facility] that patient declined her CPAP therapy at this time. She will contact them when she is ready to proceed with initiating therapy. Message will be sent to Dr Tresa Endo for his review.

## 2017-09-01 NOTE — Telephone Encounter (Signed)
AHI 19.7/h; RDI 22.8/h; REM AHI 57.0/h: AHI supine 23.1: O2 nadir to 65%.  I strongly recommend CPAP initiation with severe OSA in REM sleep and O2 down to 65%.   Would call patient and review her sleep findings with her to encourage treatment.  If she does not agree then arrange ov to discuss.

## 2017-09-02 NOTE — Telephone Encounter (Signed)
S/W patient in reference to her initiating her CPAP therapy. She states that she did not tell [facility] that she was not going to start her therapy. She told them that due to some other financial obligations it would be at least 1-2 weeks before she could come in for set up. Patient states that she will be contacting [facility] within her next pay period to schedule set up appointment.

## 2017-09-07 ENCOUNTER — Other Ambulatory Visit: Payer: Self-pay | Admitting: Endocrinology

## 2017-09-08 ENCOUNTER — Other Ambulatory Visit: Payer: Self-pay

## 2017-09-08 ENCOUNTER — Telehealth: Payer: Self-pay | Admitting: Endocrinology

## 2017-09-08 MED ORDER — METFORMIN HCL ER 500 MG PO TB24: ORAL_TABLET | ORAL | 3 refills | Status: DC

## 2017-09-08 NOTE — Telephone Encounter (Signed)
I have sent with note to pharmacy that patient needs appointment for further refills.

## 2017-09-08 NOTE — Telephone Encounter (Signed)
Please refill x 3 mos Ov is due 

## 2017-09-08 NOTE — Telephone Encounter (Signed)
Refill for :  metFORMIN (GLUCOPHAGE-XR) 500 MG 24 hr tablet [[id number]][[id number]]  Send to  Ph[facility]:  [facility] [place] [phone]

## 2017-09-09 DIAGNOSIS — M545 Low back pain: Secondary | ICD-10-CM | POA: Diagnosis not present

## 2017-09-17 ENCOUNTER — Other Ambulatory Visit: Payer: Self-pay | Admitting: Orthopedic Surgery

## 2017-09-17 DIAGNOSIS — M545 Low back pain: Secondary | ICD-10-CM

## 2017-10-13 ENCOUNTER — Ambulatory Visit
Admission: RE | Admit: 2017-10-13 | Discharge: 2017-10-13 | Disposition: A | Discharge status: Home / Self Care | Payer: 59 | Source: Ambulatory Visit | Attending: Orthopedic Surgery | Admitting: Orthopedic Surgery

## 2017-10-13 DIAGNOSIS — M48061 Spinal stenosis, lumbar region without neurogenic claudication: Secondary | ICD-10-CM | POA: Diagnosis not present

## 2017-10-13 DIAGNOSIS — M545 Low back pain: Secondary | ICD-10-CM

## 2017-10-25 ENCOUNTER — Other Ambulatory Visit: Payer: Self-pay | Admitting: Student

## 2017-10-25 DIAGNOSIS — L0211 Cutaneous abscess of neck: Secondary | ICD-10-CM | POA: Diagnosis not present

## 2017-10-25 NOTE — Progress Notes (Signed)
Hettie HolsteinLetitia L Giangrande Documented: 10/25/2017 3:32 PM Location: [facility] Patient #: [id number] DOB: September 03, 1965 Single / Language: [place] P[place] / Race: Black or African American Female  History of Present Illness  The patient is a 52 year old female presenting for a post-operative visit. She is status post incision and drainage of posterior neck abscess (2 adjacent abscesses seen on CT) on 01/08/17 by Dr. Cliffton AstersWhite. She was discharged postop day 1. She continued wet-to-dry dressing changes until she was no longer able to pack the area and it healed well.   She was seen again in February with pain on her posterior neck near the area of the I&D. We initially obtained an ultrasound which showed a ill-defined fluid collection measuring 5.1 x 2 x 2.4 cm. A follow-up CT scan showed some stranding most likely "scarring from the previous infection."  Her symptoms continued to improve until the last 1.5 weeks when she started noticing increased pain in the area. She admits to subjective fevers/chills 3 days ago. In the past few days, she has not had fevers and the pain has subsided slightly. She states area may be draining but she isn't sure.   Dr. Cliffton AstersWhite spoke to her on the phone yesterday and he recommended that she be evaluated in the emergency room if she was truly having fever, chills, malaise, and increased neck pain to rule out recurrent abscess given her hx of DM. However, the patient wanted to be evaluated in the office first.   Review of Systems  Skin Not Present- Change in Wart/Mole, Dryness, Hives, Jaundice, New Lesions, Non-Healing Wounds, Rash and Ulcer. HEENT Present- Wears glasses/contact lenses. Not Present- Earache, Hearing Loss, Hoarseness, Nose Bleed, Oral Ulcers, Ringing in the Ears, Seasonal Allergies, Sinus Pain, Sore Throat, Visual Disturbances and Yellow Eyes. Respiratory Present- Snoring. Not Present- Bloody sputum, Chronic Cough, Difficulty Breathing and Wheezing. Breast  Not Present- Breast Mass, Breast Pain, Nipple Discharge and Skin Changes. Cardiovascular Present- Swelling of Extremities. Not Present- Chest Pain, Difficulty Breathing Lying Down, Leg Cramps, Palpitations, Rapid Heart Rate and Shortness of Breath. Musculoskeletal Present- Back Pain and Joint Pain. Not Present- Joint Stiffness, Muscle Pain, Muscle Weakness and Swelling of Extremities. Neurological Not Present- Decreased Memory, Fainting, Headaches, Numbness, Seizures, Tingling, Tremor, Trouble walking and Weakness. Psychiatric Not Present- Anxiety, Bipolar, Change in Sleep Pattern, Depression, Fearful and Frequent crying. Endocrine Not Present- Cold Intolerance, Excessive Hunger, Hair Changes, Heat Intolerance, Hot flashes and New Diabetes. Hematology Not Present- Blood Thinners, Easy Bruising, Excessive bleeding, Gland problems, HIV and Persistent Infections.  Vitals  10/25/2017 3:32 PM Weight: 292 lb Height: 65.5in Body Surface Area: 2.34 m Body Mass Index: 47.85 kg/m  Temp.: 98.57F  Pulse: 101 (Regular)  BP: 130/84 (Sitting, Left Arm, Standard)   Physical Exam GENERAL: Well-developed, well nourished female in no acute distress  EYES: No scleral icterus Pupils equal, lids normal  EXTERNAL EARS: Intact, no masses or lesions EXTERNAL NOSE: Intact, no masses or lesions MOUTH: Lips - no lesions Dentition - normal for age  RESPIRATORY: Normal effort, no use of accessory muscles  MUSCULOSKELETAL: Normal gait Grossly normal ROM upper extremities Grossly normal ROM lower extremities  SKIN: Warm and dry Not diaphoretic  PSYCHIATRIC: Normal judgement and insight Normal mood and affect Alert, oriented x 3  Integumentary Posterior neck: Prior I&D site on central posterior neck with large keloid type inflammation/blister, weeping clear fluid There is surrounding tenderness on both sides of the scar spanning about 4 cm out (R>L) There is mild induration of  the  surrounding soft tissue but no cellulitis Full ROM of neck - extension, flexion, and lateral movements   Assessment & Plan  STATUS POST INCISION AND DRAINAGE (W09.811) She is presenting with recurrent posterior neck pain status post incision and drainage of posterior neck abscess in the OR in October 2018 by Dr. Cliffton Asters. On exam, there was soft tissue swelling which resembled more of a keloid/blister at her previous scar.  It appeared fluid-filled, so I discussed aspiration with the patient and she agreed to proceed. An 18-gauge needle was introduced into the swollen area in several locations but I did not get a return of fluid. However, when withdrawing the needle about 3 cc of purulent/?sebaceous material was expelled from the area. I decided to proceed with incision and drainage. I made a 2 cm linear incision, but could not locate the cavity of infection. I used 3 silver nitrate sticks to control the bleeding.  Hemostasis was achieved.  Given the location of the abscess, I was not comfortable proceeding with further exploration in the office. Per Dr. Lucilla Lame initial recommendations, I advised her to go to the emergency room to be evaluated with CT scan soft tissue neck. Surgery will be consulted if needed. She agrees to this plan.  Hedda Slade, [facility]

## 2017-10-26 ENCOUNTER — Other Ambulatory Visit: Payer: Self-pay

## 2017-10-26 ENCOUNTER — Emergency Department (HOSPITAL_COMMUNITY): Payer: 59

## 2017-10-26 ENCOUNTER — Encounter (HOSPITAL_COMMUNITY): Payer: Self-pay | Admitting: Emergency Medicine

## 2017-10-26 ENCOUNTER — Inpatient Hospital Stay (HOSPITAL_COMMUNITY)
Admission: EM | Admit: 2017-10-26 | Discharge: 2017-10-31 | DRG: 571 | Disposition: A | Discharge status: Home Health | Payer: 59 | Attending: Internal Medicine | Admitting: Internal Medicine

## 2017-10-26 DIAGNOSIS — M109 Gout, unspecified: Secondary | ICD-10-CM | POA: Diagnosis present

## 2017-10-26 DIAGNOSIS — D509 Iron deficiency anemia, unspecified: Secondary | ICD-10-CM | POA: Diagnosis present

## 2017-10-26 DIAGNOSIS — E1165 Type 2 diabetes mellitus with hyperglycemia: Secondary | ICD-10-CM | POA: Diagnosis present

## 2017-10-26 DIAGNOSIS — L0211 Cutaneous abscess of neck: Principal | ICD-10-CM | POA: Diagnosis present

## 2017-10-26 DIAGNOSIS — Z8249 Family history of ischemic heart disease and other diseases of the circulatory system: Secondary | ICD-10-CM

## 2017-10-26 DIAGNOSIS — M479 Spondylosis, unspecified: Secondary | ICD-10-CM | POA: Diagnosis present

## 2017-10-26 DIAGNOSIS — Z6841 Body Mass Index (BMI) 40.0 and over, adult: Secondary | ICD-10-CM

## 2017-10-26 DIAGNOSIS — L03221 Cellulitis of neck: Secondary | ICD-10-CM

## 2017-10-26 DIAGNOSIS — Z91030 Bee allergy status: Secondary | ICD-10-CM

## 2017-10-26 DIAGNOSIS — Z801 Family history of malignant neoplasm of trachea, bronchus and lung: Secondary | ICD-10-CM

## 2017-10-26 DIAGNOSIS — L0291 Cutaneous abscess, unspecified: Secondary | ICD-10-CM | POA: Diagnosis not present

## 2017-10-26 DIAGNOSIS — Z833 Family history of diabetes mellitus: Secondary | ICD-10-CM

## 2017-10-26 DIAGNOSIS — Z7984 Long term (current) use of oral hypoglycemic drugs: Secondary | ICD-10-CM

## 2017-10-26 DIAGNOSIS — R739 Hyperglycemia, unspecified: Secondary | ICD-10-CM

## 2017-10-26 DIAGNOSIS — D62 Acute posthemorrhagic anemia: Secondary | ICD-10-CM | POA: Diagnosis not present

## 2017-10-26 DIAGNOSIS — E119 Type 2 diabetes mellitus without complications: Secondary | ICD-10-CM | POA: Diagnosis not present

## 2017-10-26 DIAGNOSIS — R002 Palpitations: Secondary | ICD-10-CM | POA: Diagnosis present

## 2017-10-26 DIAGNOSIS — L0390 Cellulitis, unspecified: Secondary | ICD-10-CM

## 2017-10-26 DIAGNOSIS — Z91040 Latex allergy status: Secondary | ICD-10-CM

## 2017-10-26 DIAGNOSIS — G4730 Sleep apnea, unspecified: Secondary | ICD-10-CM | POA: Diagnosis present

## 2017-10-26 LAB — CBC WITH DIFFERENTIAL/PLATELET
Abs Immature Granulocytes: 0.1 10*3/uL (ref 0.0–0.1)
BASOS ABS: 0.1 10*3/uL (ref 0.0–0.1)
BASOS PCT: 1 %
EOS ABS: 0.1 10*3/uL (ref 0.0–0.7)
Eosinophils Relative: 1 %
HCT: 35.9 % — ABNORMAL LOW (ref 36.0–46.0)
Hemoglobin: 11.8 g/dL — ABNORMAL LOW (ref 12.0–15.0)
Immature Granulocytes: 2 %
Lymphocytes Relative: 30 %
Lymphs Abs: 2.6 10*3/uL (ref 0.7–4.0)
MCH: 27 pg (ref 26.0–34.0)
MCHC: 32.9 g/dL (ref 30.0–36.0)
MCV: 82.2 fL (ref 78.0–100.0)
Monocytes Absolute: 0.6 10*3/uL (ref 0.1–1.0)
Monocytes Relative: 7 %
NEUTROS PCT: 59 %
Neutro Abs: 5.3 10*3/uL (ref 1.7–7.7)
PLATELETS: 299 10*3/uL (ref 150–400)
RBC: 4.37 MIL/uL (ref 3.87–5.11)
RDW: 14.3 % (ref 11.5–15.5)
WBC: 8.9 10*3/uL (ref 4.0–10.5)

## 2017-10-26 LAB — BASIC METABOLIC PANEL
Anion gap: 12 (ref 5–15)
BUN: 8 mg/dL (ref 6–20)
CO2: 24 mmol/L (ref 22–32)
Calcium: 9.1 mg/dL (ref 8.9–10.3)
Chloride: 99 mmol/L (ref 98–111)
Creatinine, Ser: 0.81 mg/dL (ref 0.44–1.00)
GFR calc Af Amer: >60 mL/min (ref >60)
GFR calc non Af Amer: >60 mL/min (ref >60)
GLUCOSE: 507 mg/dL — AB (ref 70–99)
Potassium: 3.8 mmol/L (ref 3.5–5.1)
SODIUM: 135 mmol/L (ref 135–145)

## 2017-10-26 LAB — PROTIME-INR
INR: 1.11
PROTHROMBIN TIME: 14.2 s (ref 11.4–15.2)

## 2017-10-26 LAB — HCG, QUANTITATIVE, PREGNANCY: HCG, BETA CHAIN, QUANT, S: 2 m[IU]/mL (ref <5)

## 2017-10-26 LAB — SEDIMENTATION RATE: Sed Rate: 45 mm/hr — ABNORMAL HIGH (ref 0–22)

## 2017-10-26 LAB — C-REACTIVE PROTEIN: CRP: 12.9 mg/dL — AB (ref <1.0)

## 2017-10-26 LAB — BRAIN NATRIURETIC PEPTIDE: B Natriuretic Peptide: 11.8 pg/mL (ref 0.0–100.0)

## 2017-10-26 LAB — GLUCOSE, CAPILLARY: GLUCOSE-CAPILLARY: 392 mg/dL — AB (ref 70–99)

## 2017-10-26 LAB — I-STAT CG4 LACTIC ACID, ED: Lactic Acid, Venous: 1.84 mmol/L (ref 0.5–1.9)

## 2017-10-26 MED ORDER — HYDRALAZINE HCL 20 MG/ML IJ SOLN: 5.0000 mg | INTRAMUSCULAR | Status: DC | PRN

## 2017-10-26 MED ORDER — LORATADINE 10 MG PO TABS
10.0000 mg | ORAL_TABLET | Freq: Every day | ORAL | Status: DC
  Administered 2017-10-27 – 2017-10-31 (×5): 10 mg via ORAL
  Filled 2017-10-26 (×5): qty 1

## 2017-10-26 MED ORDER — SENNOSIDES-DOCUSATE SODIUM 8.6-50 MG PO TABS: 1.0000 | ORAL_TABLET | Freq: Every evening | ORAL | Status: DC | PRN

## 2017-10-26 MED ORDER — SODIUM CHLORIDE 0.9 % IV SOLN
INTRAVENOUS | Status: DC
  Administered 2017-10-26 – 2017-10-27 (×3): via INTRAVENOUS

## 2017-10-26 MED ORDER — ADULT MULTIVITAMIN W/MINERALS CH
1.0000 | ORAL_TABLET | Freq: Every day | ORAL | Status: DC
  Administered 2017-10-27 – 2017-10-31 (×5): 1 via ORAL
  Filled 2017-10-26 (×5): qty 1

## 2017-10-26 MED ORDER — SODIUM CHLORIDE 0.9 % IV SOLN
2.0000 g | INTRAVENOUS | Status: DC
  Administered 2017-10-27 – 2017-10-29 (×4): 2 g via INTRAVENOUS
  Filled 2017-10-26 (×4): qty 20

## 2017-10-26 MED ORDER — SODIUM CHLORIDE 0.9 % IV BOLUS
1000.0000 mL | Freq: Once | INTRAVENOUS | Status: AC
Start: 2017-10-26 — End: 2017-10-26
  Administered 2017-10-26: 1000 mL via INTRAVENOUS

## 2017-10-26 MED ORDER — ONDANSETRON HCL 4 MG/2ML IJ SOLN
4.0000 mg | Freq: Four times a day (QID) | INTRAMUSCULAR | Status: DC | PRN
  Administered 2017-10-28 – 2017-10-30 (×4): 4 mg via INTRAVENOUS
  Filled 2017-10-26 (×3): qty 2

## 2017-10-26 MED ORDER — ASPIRIN-ACETAMINOPHEN-CAFFEINE 250-250-65 MG PO TABS
1.0000 | ORAL_TABLET | Freq: Four times a day (QID) | ORAL | Status: DC | PRN
Start: 2017-10-26 — End: 2017-10-31
  Administered 2017-10-27: 2 via ORAL
  Filled 2017-10-26 (×2): qty 2

## 2017-10-26 MED ORDER — MELOXICAM 7.5 MG PO TABS
15.0000 mg | ORAL_TABLET | Freq: Every day | ORAL | Status: DC
  Administered 2017-10-27 – 2017-10-31 (×5): 15 mg via ORAL
  Filled 2017-10-26 (×5): qty 2

## 2017-10-26 MED ORDER — SODIUM CHLORIDE 0.9 % IV BOLUS
1000.0000 mL | Freq: Once | INTRAVENOUS | Status: AC
  Administered 2017-10-26: 1000 mL via INTRAVENOUS

## 2017-10-26 MED ORDER — IOHEXOL 300 MG/ML  SOLN
75.0000 mL | Freq: Once | INTRAMUSCULAR | Status: AC | PRN
  Administered 2017-10-26: 75 mL via INTRAVENOUS

## 2017-10-26 MED ORDER — PIPERACILLIN-TAZOBACTAM 3.375 G IVPB 30 MIN
3.3750 g | Freq: Once | INTRAVENOUS | Status: AC
  Administered 2017-10-26: 3.375 g via INTRAVENOUS
  Filled 2017-10-26: qty 50

## 2017-10-26 MED ORDER — INSULIN ASPART 100 UNIT/ML ~~LOC~~ SOLN
0.0000 [IU] | Freq: Three times a day (TID) | SUBCUTANEOUS | Status: DC
  Administered 2017-10-27 (×3): 7 [IU] via SUBCUTANEOUS
  Administered 2017-10-28: 9 [IU] via SUBCUTANEOUS
  Administered 2017-10-28: 5 [IU] via SUBCUTANEOUS
  Administered 2017-10-29: 9 [IU] via SUBCUTANEOUS
  Administered 2017-10-29 (×2): 5 [IU] via SUBCUTANEOUS
  Administered 2017-10-30 (×3): 7 [IU] via SUBCUTANEOUS
  Administered 2017-10-31: 9 [IU] via SUBCUTANEOUS
  Administered 2017-10-31: 3 [IU] via SUBCUTANEOUS

## 2017-10-26 MED ORDER — ONDANSETRON HCL 4 MG PO TABS: 4.0000 mg | ORAL_TABLET | Freq: Four times a day (QID) | ORAL | Status: DC | PRN

## 2017-10-26 MED ORDER — ZOLPIDEM TARTRATE 5 MG PO TABS: 5.0000 mg | ORAL_TABLET | Freq: Every evening | ORAL | Status: DC | PRN

## 2017-10-26 MED ORDER — VANCOMYCIN HCL IN DEXTROSE 1-5 GM/200ML-% IV SOLN
1000.0000 mg | Freq: Once | INTRAVENOUS | Status: AC
  Administered 2017-10-26: 1000 mg via INTRAVENOUS
  Filled 2017-10-26: qty 200

## 2017-10-26 MED ORDER — INSULIN ASPART 100 UNIT/ML ~~LOC~~ SOLN
8.0000 [IU] | Freq: Once | SUBCUTANEOUS | Status: AC
  Administered 2017-10-26: 8 [IU] via INTRAVENOUS
  Filled 2017-10-26: qty 1

## 2017-10-26 MED ORDER — VANCOMYCIN HCL IN DEXTROSE 1-5 GM/200ML-% IV SOLN
1000.0000 mg | Freq: Three times a day (TID) | INTRAVENOUS | Status: DC
  Administered 2017-10-27: 1000 mg via INTRAVENOUS
  Filled 2017-10-26 (×2): qty 200

## 2017-10-26 MED ORDER — CYCLOBENZAPRINE HCL 10 MG PO TABS: 5.0000 mg | ORAL_TABLET | Freq: Every evening | ORAL | Status: DC | PRN

## 2017-10-26 MED ORDER — OXYCODONE-ACETAMINOPHEN 5-325 MG PO TABS
1.0000 | ORAL_TABLET | ORAL | Status: DC | PRN
  Administered 2017-10-29: 1 via ORAL
  Filled 2017-10-26: qty 1

## 2017-10-26 MED ORDER — INSULIN GLARGINE 100 UNIT/ML ~~LOC~~ SOLN
5.0000 [IU] | Freq: Every day | SUBCUTANEOUS | Status: DC
  Administered 2017-10-26 – 2017-10-29 (×4): 5 [IU] via SUBCUTANEOUS
  Filled 2017-10-26 (×5): qty 0.05

## 2017-10-26 MED ORDER — METOPROLOL TARTRATE 25 MG PO TABS
25.0000 mg | ORAL_TABLET | Freq: Two times a day (BID) | ORAL | Status: DC
  Administered 2017-10-26 – 2017-10-30 (×7): 25 mg via ORAL
  Filled 2017-10-26 (×10): qty 1

## 2017-10-26 MED ORDER — ACETAMINOPHEN 325 MG PO TABS
650.0000 mg | ORAL_TABLET | Freq: Four times a day (QID) | ORAL | Status: DC | PRN
  Administered 2017-10-27 – 2017-10-30 (×5): 650 mg via ORAL
  Filled 2017-10-26 (×7): qty 2

## 2017-10-26 MED ORDER — ACETAMINOPHEN 650 MG RE SUPP: 650.0000 mg | Freq: Four times a day (QID) | RECTAL | Status: DC | PRN

## 2017-10-26 MED ORDER — ONE-DAILY MULTI VITAMINS PO TABS: 1.0000 | ORAL_TABLET | Freq: Every day | ORAL | Status: DC

## 2017-10-26 MED ORDER — METRONIDAZOLE IN NACL 5-0.79 MG/ML-% IV SOLN
500.0000 mg | Freq: Three times a day (TID) | INTRAVENOUS | Status: DC
  Administered 2017-10-26 – 2017-10-27 (×3): 500 mg via INTRAVENOUS
  Filled 2017-10-26 (×3): qty 100

## 2017-10-26 NOTE — ED Triage Notes (Signed)
Pt states hx of abscesses to the back of her neck, had 3 lanced in October. States the same spot became swollen yesterday and went to PCP who numbed the site and tried to drain this.

## 2017-10-26 NOTE — ED Provider Notes (Addendum)
Patient placed in Quick Look pathway, seen and evaluated   Chief Complaint: abscess to back of neck  HPI:  Karalee HeightLetitia Maute is a 52 y.o. female who presents to the ED with an abscess to the back of her neck. The abscess started 2 weeks ago and got worse. Patient went to her PCP yesterday and had I&D of the area. Patient has had similar problem in the same area in the past.   ROS: Neck: pain, abscess  Physical Exam:  BP (!) 110/49 (BP Location: Left Arm)   Pulse 76   Temp 98.1 F (36.7 C) (Oral)   Resp 18   Ht 5' 5.51" (1.664 m)   Wt 131.1 kg (289 lb)   SpO2 96%   BMI 47.34 kg/m    Gen: No distress  Neuro: Awake and Alert  Skin: draining abscess back of neck with firm tender area surrounding the draining area.   Patient with hx of fever and tachycardia today. Labs ordered.   Initiation of care has begun. The patient has been counseled on the process, plan, and necessity for staying for the completion/evaluation, and the remainder of the medical screening examination     Janne Napoleoneese, Hope M, NP 10/26/17 1631    Kerrie Buffaloeese, Hope HenriettaM, NP 10/29/17 [id number]    Raeford RazorKohut, Stephen, MD 10/30/17 [phone]

## 2017-10-26 NOTE — ED Provider Notes (Signed)
[facility] EMERGENCY DEPARTMENT Provider Note   CSN: [id number] Arrival date & time: 10/26/17  1616     History   Chief Complaint Chief Complaint  Patient presents with  . Abscess    back of neck    HPI Jean Berry is a 52 y.o. female.  HPI Presented to the emergency room for evaluation of a draining abscess in her neck.  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  She has required surgical debridement in the past.  Patient started having increasing drainage and swelling in the last couple of weeks.  Patient states she went to the surgeon's office yesterday.  She had an I&D procedure in the office.  Patient states however she was unable to tolerate it fully.  She was instructed to come to the emergency room to get a CT scan of her neck.  Patient denies any fevers or chills.  No vomiting or diarrhea.  She has been taking her diabetes medications regularly Past Medical History:  Diagnosis Date  . Arthritis   . Diabetes mellitus without complication (HCC)   . Gout flare   . Lower extremity edema 11/18/2016  . Osteoarthritis of back   . Palpitations   . Shortness of breath 11/18/2016  . Thyroid disease    left lobect approx 2012--benign per pt    Patient Active Problem List   Diagnosis Date Noted  . Neck abscess 01/08/2017  . Shortness of breath 11/18/2016  . Lower extremity edema 11/18/2016  . Back pain 11/15/2016  . Palpitations 11/15/2016  . Tachycardia 11/15/2016  . Primary osteoarthritis of both knees 07/09/2016  . Diabetes mellitus without complication (HCC)   . Arthritis   . Lumbar spondylosis   . Gout flare   . Thyroid disease     Past Surgical History:  Procedure Laterality Date  . COLONOSCOPY    . GALLBLADDER SURGERY    . IRRIGATION AND DEBRIDEMENT ABSCESS N/A 01/08/2017   Procedure: IRRIGATION AND DEBRIDEMENT ABSCESS POSTERIOR NECK;  Surgeon: Andria Meuse, MD;  Location: [facility] OR;  Service:  General;  Laterality: N/A;  . OVARIAN CYST SURGERY Right   . THYROID SURGERY Left   . TONSILECTOMY, ADENOIDECTOMY, BILATERAL MYRINGOTOMY AND TUBES    . TUBAL LIGATION    . UPPER GASTROINTESTINAL ENDOSCOPY       OB History   None      Home Medications    Prior to Admission medications   Medication Sig Start Date End Date Taking? Authorizing Provider  Ascorbic Acid (VITAMIN C) 100 MG tablet Take 100 mg by mouth daily.    [provider]  Bioflavonoid Products (BIOFLEX PO) Take 1 tablet by mouth daily.    [provider]  BIOTIN PO Take 1 tablet by mouth daily.    [provider]  cyclobenzaprine (FLEXERIL) 10 MG tablet Take 10 mg by mouth 3 (three) times daily as needed. 10/11/16   [provider]  Diclofenac Sodium (PENNSAID) 2 % SOLN Place 1 application onto the skin 2 (two) times daily. Patient taking differently: Place 1 application onto the skin as needed.  07/09/16   Andrena Mews, DO  ferrous sulfate 325 (65 FE) MG tablet Take 325 mg by mouth daily with breakfast.    [provider]  loratadine (CLARITIN) 10 MG tablet Take 10 mg by mouth daily. 01/30/16   [provider]  magnesium 30 MG tablet Take 30 mg by mouth 2 (two) times  daily.    [provider]  metFORMIN (GLUCOPHAGE-XR) 500 MG 24 hr tablet TAKE 4 TABLETS BY MOUTH EVERY MORNING WITH BREAKFAST 09/08/17   Romero BellingEllison, Sean, MD  metoprolol tartrate (LOPRESSOR) 25 MG tablet Take 1 tablet (25 mg total) by mouth 2 (two) times daily. 03/23/17   Chilton Siandolph, Tiffany, MD  Multiple Vitamin (MULTIVITAMIN) tablet Take 1 tablet by mouth daily.    [provider]  Multiple Vitamins-Minerals (HAIR VITAMINS PO) Take 1 tablet by mouth daily.    [provider]  phentermine (ADIPEX-P) 37.5 MG tablet Take 37.5 mg by mouth daily.  12/30/16   [provider]    Family History Family History  Problem Relation Age of Onset  . Lung cancer Mother   . Heart  disease Mother   . Diabetes Maternal Grandmother   . Heart attack Maternal Grandmother     Social History Social History   Tobacco Use  . Smoking status: Never Smoker  . Smokeless tobacco: Never Used  Substance Use Topics  . Alcohol use: No  . Drug use: No     Allergies   Bee venom and Latex   Review of Systems Review of Systems  All other systems reviewed and are negative.    Physical Exam Updated Vital Signs BP (!) 152/72   Pulse (!) 111   Temp 98.7 F (37.1 C)   Resp 19   SpO2 98%   Physical Exam  Constitutional: She appears well-developed and well-nourished. No distress.  Obese  HENT:  Head: Normocephalic and atraumatic.  Right Ear: External ear normal.  Left Ear: External ear normal.  Eyes: Conjunctivae are normal. Right eye exhibits no discharge. Left eye exhibits no discharge. No scleral icterus.  Neck: Neck supple. No tracheal deviation present.  Status post incision and drainage the posterior aspect of the fat in her neck, small amount of serosanguineous drainage, no fluctuance, no surrounding erythema  Cardiovascular: Normal rate, regular rhythm and intact distal pulses.  Pulmonary/Chest: Effort normal and breath sounds normal. No stridor. No respiratory distress. She has no wheezes. She has no rales.  Abdominal: Soft. Bowel sounds are normal. She exhibits no distension. There is no tenderness. There is no rebound and no guarding.  Musculoskeletal: She exhibits no edema or tenderness.  Neurological: She is alert. She has normal strength. No cranial nerve deficit (no facial droop, extraocular movements intact, no slurred speech) or sensory deficit. She exhibits normal muscle tone. She displays no seizure activity. Coordination normal.  Skin: Skin is warm and dry. No rash noted.  Psychiatric: She has a normal mood and affect.  Nursing note and vitals reviewed.    ED Treatments / Results  Labs (all labs ordered are listed, but only abnormal results  are displayed) Labs Reviewed  CBC WITH DIFFERENTIAL/PLATELET - Abnormal; Notable for the following components:      Result Value   Hemoglobin 11.8 (*)    HCT 35.9 (*)    All other components within normal limits  BASIC METABOLIC PANEL - Abnormal; Notable for the following components:   Glucose, Bld 507 (*)    All other components within normal limits  I-STAT CG4 LACTIC ACID, ED    EKG None  Radiology Ct Soft Tissue Neck W Contrast  Result Date: 10/26/2017 CLINICAL DATA:  Posterior neck abscess for 2 weeks. EXAM: CT NECK WITH CONTRAST TECHNIQUE: Multidetector CT imaging of the neck was performed using the standard protocol following the bolus administration of intravenous contrast. CONTRAST:  75mL OMNIPAQUE IOHEXOL  300 MG/ML  SOLN COMPARISON:  CT neck June 16, 2017 and January 07, 2017. FINDINGS: Large body habitus results in overall noisy image quality. PHARYNX AND LARYNX: Normal.  Widely patent airway. SALIVARY GLANDS: Normal.  Small intraparotid lymph nodes. THYROID: Status post LEFT thyroidectomy.  Normal RIGHT thyroid lobe. LYMPH NODES: No lymphadenopathy by CT size criteria. Greater than expected number small stable cervical lymph nodes. VASCULAR: Normal. LIMITED INTRACRANIAL: Normal. VISUALIZED ORBITS: Normal. MASTOIDS AND VISUALIZED PARANASAL SINUSES: Well-aerated. SKELETON: Mild temporomandibular osteoarthrosis.  Nonacute. UPPER CHEST: Lung apices are clear. No superior mediastinal lymphadenopathy. OTHER: Recurrent 2.7 x 3.2 x 4.1 cm phlegmon midline paraspinal subcutaneous fat at C4 through C6 with central fluid density, surrounding subcutaneous fat stranding. No subcutaneous gas or radiopaque foreign bodies. IMPRESSION: 1. Recurrent posterior neck subcutaneous fat phlegmon with early abscess. Surrounding cellulitis. 2. No acute process in the anterior neck. Electronically Signed   By: Awilda Metro M.D.   On: 10/26/2017 20:20    Procedures Procedures (including critical care  time)  Medications Ordered in ED Medications  piperacillin-tazobactam (ZOSYN) IVPB 3.375 g (3.375 g Intravenous New Bag/Given 10/26/17 2048)  insulin aspart (novoLOG) injection 8 Units (8 Units Intravenous Given 10/26/17 1910)  sodium chloride 0.9 % bolus 1,000 mL (1,000 mLs Intravenous New Bag/Given 10/26/17 1912)  iohexol (OMNIPAQUE) 300 MG/ML solution 75 mL (75 mLs Intravenous Contrast Given 10/26/17 1944)     Initial Impression / Assessment and Plan / ED Course  I have reviewed the triage vital signs and the nursing notes.  Pertinent labs & imaging results that were available during my care of the patient were reviewed by me and considered in my medical decision making (see chart for details).  Clinical Course as of Oct 26 2049  Wed Oct 26, 2017  2024 Patient CT scan shows recurrent phlegmon.  Some early signs of abscess.  Patient also was noted to have hyperglycemia but no signs of DKA.   [JK]    Clinical Course User Index [JK] Linwood Dibbles, MD    Pt presents to the ED with persistent neck pain, swelling, and drainage.  Wound is open and draining.  Labs are reassuring with the exception of hyperglycemia.   CT scan shows phlegmon.  Considering her hyperglycemia and recent I&D I have started her on IV abx.  Will consult with medical service for admission IV abx.  Final Clinical Impressions(s) / ED Diagnoses   Final diagnoses:  Phlegmon  Cellulitis, unspecified cellulitis site  Hyperglycemia      Linwood Dibbles, MD 10/26/17 2053

## 2017-10-26 NOTE — ED Notes (Signed)
Pt's abscess redressed with a new gauze per patient request

## 2017-10-26 NOTE — ED Notes (Signed)
Pt's abscess on back of neck redressed with new gauze per pt request

## 2017-10-26 NOTE — Progress Notes (Signed)
Pharmacy Antibiotic Note  Jean HeightLetitia Berry is a 52 y.o. female admitted on 10/26/2017 with sepsis.  Plan: Vanc 1 g q8h Ceftriaxone, flagyl per MD Monitor renal fx cx vt prn Weight: 289 lb (131.1 kg)  Temp (24hrs), Avg:98.7 F (37.1 C), Min:98.7 F (37.1 C), Max:98.7 F (37.1 C)  Recent Labs  Lab 10/26/17 1636 10/26/17 1658  WBC 8.9  --   CREATININE 0.81  --   LATICACIDVEN  --  1.84    Estimated Creatinine Clearance: 112.9 mL/min (by C-G formula based on SCr of 0.81 mg/dL).    Allergies  Allergen Reactions  . Bee Venom Anaphylaxis  . Latex Rash   Jean BlissMichael Yeilyn Berry, PharmD, BCPS, BCCCP Clinical Pharmacist [phone]  Please check AMION for all [facility] numbers  10/26/2017 10:14 PM

## 2017-10-26 NOTE — H&P (Signed)
History and Physical    Jean Berry MWN:[id number] DOB: [birth year] DOA: 10/26/2017  Referring MD/NP/PA:   PCP: Minette Brine   Patient coming from:  The patient is coming from home.  At baseline, pt is independent for most of ADL.  Chief Complaint: posterior neck pain and abscess  HPI: Jean Berry is a 51 y.o. female with medical history significant of diabetes mellitus, gout, iron deficiency anemia, obesity, palpitation/tachycardia, who presents with posterior neck pain and abscess.  Patient states that she has history of posterior neck abscess required I&D 01/2017.  She has been having posterior neck pain for about 10 days. She had subjective fevers/chills 3 days ago, but no fever or chills today. She was seen by PA in Dr. Richarda Blade office yesterday and had I&D for neck abscess. Dr. Hulen Skains spoke to her on the phone yesterday and recommended that she be evaluated in the emergency room. She continues to have pain in the posterior neck, which is constant, 8 out of 10 severity, nonradiating. Patient does not have nausea, vomiting, diarrhea or abdominal pain.  Denies chest pain, shortness breath or cough. She reports that she has chronic palpitation and she is taking metoprolol which is prescribed by cardiologist.  ED Course: pt was found to have WBC 8.9, lactic acid 1.84, electrolytes renal function okay, blood sugar 507 with anion gap 12, temperature normal, tachycardia, no tachypnea, oxygen saturation 98% on room air.  Patient is placed on telemetry bed of observation  CT of the neck soft tissue showed: 1. Recurrent posterior neck subcutaneous fat phlegmon with early abscess. Surrounding cellulitis. 2. No acute process in the anterior neck.  Review of Systems:   General: No fevers, chills, no body weight gain, has fatigue HEENT: no blurry vision, hearing changes or sore throat. Has neck pain and neck abscess. Respiratory: no dyspnea, coughing, wheezing CV: no chest pain, no  palpitations GI: no nausea, vomiting, abdominal pain, diarrhea, constipation GU: no dysuria, burning on urination, increased urinary frequency, hematuria  Ext: no leg edema Neuro: no unilateral weakness, numbness, or tingling, no vision change or hearing loss Skin: no rash. MSK: No muscle spasm, no deformity, no limitation of range of movement in spin Heme: No easy bruising.  Travel history: No recent long distant travel.  Allergy:  Allergies  Allergen Reactions  . Bee Venom Anaphylaxis  . Latex Rash    Past Medical History:  Diagnosis Date  . Arthritis   . Diabetes mellitus without complication ([place])   . Gout flare   . Lower extremity edema 11/18/2016  . Osteoarthritis of back   . Palpitations   . Shortness of breath 11/18/2016  . Thyroid disease    left lobect approx 2012--benign per pt    Past Surgical History:  Procedure Laterality Date  . COLONOSCOPY    . GALLBLADDER SURGERY    . IRRIGATION AND DEBRIDEMENT ABSCESS N/A 01/08/2017   Procedure: IRRIGATION AND DEBRIDEMENT ABSCESS POSTERIOR NECK;  Surgeon: Ileana Roup, MD;  Location: [facility];  Service: General;  Laterality: N/A;  . OVARIAN CYST SURGERY Right   . THYROID SURGERY Left   . TONSILECTOMY, ADENOIDECTOMY, BILATERAL MYRINGOTOMY AND TUBES    . TUBAL LIGATION    . UPPER GASTROINTESTINAL ENDOSCOPY      Social History:  reports that she has never smoked. She has never used smokeless tobacco. She reports that she does not drink alcohol or use drugs.  Family History:  Family History  Problem Relation Age of Onset  . Lung cancer  Mother   . Heart disease Mother   . Diabetes Maternal Grandmother   . Heart attack Maternal Grandmother      Prior to Admission medications   Medication Sig Start Date End Date Taking? Authorizing Provider  Ascorbic Acid (VITAMIN C) 100 MG tablet Take 100 mg by mouth daily.    [provider]  Bioflavonoid Products (BIOFLEX PO) Take 1 tablet by mouth daily.    [provider]  BIOTIN PO Take 1 tablet by mouth daily.    [provider]  cyclobenzaprine (FLEXERIL) 10 MG tablet Take 10 mg by mouth 3 (three) times daily as needed. 10/11/16   [provider]  Diclofenac Sodium (PENNSAID) 2 % SOLN Place 1 application onto the skin 2 (two) times daily. Patient taking differently: Place 1 application onto the skin as needed.  07/09/16   Gerda Diss, DO  ferrous sulfate 325 (65 FE) MG tablet Take 325 mg by mouth daily with breakfast.    [provider]  loratadine (CLARITIN) 10 MG tablet Take 10 mg by mouth daily. 01/30/16   [provider]  magnesium 30 MG tablet Take 30 mg by mouth 2 (two) times daily.    [provider]  metFORMIN (GLUCOPHAGE-XR) 500 MG 24 hr tablet TAKE 4 TABLETS BY MOUTH EVERY MORNING WITH BREAKFAST 09/08/17   Renato Shin, MD  metoprolol tartrate (LOPRESSOR) 25 MG tablet Take 1 tablet (25 mg total) by mouth 2 (two) times daily. 03/23/17   Skeet Latch, MD  Multiple Vitamin (MULTIVITAMIN) tablet Take 1 tablet by mouth daily.    [provider]  Multiple Vitamins-Minerals (HAIR VITAMINS PO) Take 1 tablet by mouth daily.    [provider]  phentermine (ADIPEX-P) 37.5 MG tablet Take 37.5 mg by mouth daily.  12/30/16   [provider]    Physical Exam: Vitals:   10/26/17 1623  BP: (!) 152/72  Pulse: (!) 111  Resp: 19  Temp: 98.7 F (37.1 C)  SpO2: 98%   General: Not in acute distress HEENT:       Eyes: PERRL, EOMI, no scleral icterus.       ENT: No discharge from the ears and nose, no pharynx injection, no tonsillar enlargement.        Neck: No JVD, no bruit, no mass felt. Heme: No neck lymph node enlargement. Cardiac: S1/S2, RRR, No murmurs, No gallops or rubs. Respiratory: No rales, wheezing, rhonchi or rubs. GI: Soft, nondistended, nontender, no rebound pain, no organomegaly, BS present. GU: No hematuria Ext: No pitting leg edema bilaterally. 2+DP/PT  pulse bilaterally. Musculoskeletal: No joint deformities, No joint redness or warmth, no limitation of ROM in spin. Skin: S/p of drainage incision in posterior aspect of neck, small amount of serosanguineous drainage, no fluctuance, has mild surrounding erythema  Neuro: Alert, oriented X3, cranial nerves II-XII grossly intact, moves all extremities normally.  Psych: Patient is not psychotic, no suicidal or hemocidal ideation.  Labs on Admission: I have personally reviewed following labs and imaging studies  CBC: Recent Labs  Lab 10/26/17 1636  WBC 8.9  NEUTROABS 5.3  HGB 11.8*  HCT 35.9*  MCV 82.2  PLT 803   Basic Metabolic Panel: Recent Labs  Lab 10/26/17 1636  NA 135  K 3.8  CL 99  CO2 24  GLUCOSE 507*  BUN 8  CREATININE 0.81  CALCIUM 9.1   GFR: CrCl cannot be calculated (Unknown ideal weight.). Liver Function Tests: No results for input(s): AST, ALT, ALKPHOS,  BILITOT, PROT, ALBUMIN in the last 168 hours. No results for input(s): LIPASE, AMYLASE in the last 168 hours. No results for input(s): AMMONIA in the last 168 hours. Coagulation Profile: No results for input(s): INR, PROTIME in the last 168 hours. Cardiac Enzymes: No results for input(s): CKTOTAL, CKMB, CKMBINDEX, TROPONINI in the last 168 hours. BNP (last 3 results) No results for input(s): PROBNP in the last 8760 hours. HbA1C: No results for input(s): HGBA1C in the last 72 hours. CBG: No results for input(s): GLUCAP in the last 168 hours. Lipid Profile: No results for input(s): CHOL, HDL, LDLCALC, TRIG, CHOLHDL, LDLDIRECT in the last 72 hours. Thyroid Function Tests: No results for input(s): TSH, T4TOTAL, FREET4, T3FREE, THYROIDAB in the last 72 hours. Anemia Panel: No results for input(s): VITAMINB12, FOLATE, FERRITIN, TIBC, IRON, RETICCTPCT in the last 72 hours. Urine analysis: No results found for: COLORURINE, APPEARANCEUR, LABSPEC, PHURINE, GLUCOSEU, HGBUR, BILIRUBINUR, KETONESUR, PROTEINUR,  UROBILINOGEN, NITRITE, LEUKOCYTESUR Sepsis Labs: @LABRCNTIP (procalcitonin:4,lacticidven:4) )No results found for this or any previous visit (from the past 240 hour(s)).   Radiological Exams on Admission: Ct Soft Tissue Neck W Contrast  Result Date: 10/26/2017 CLINICAL DATA:  Posterior neck abscess for 2 weeks. EXAM: CT NECK WITH CONTRAST TECHNIQUE: Multidetector CT imaging of the neck was performed using the standard protocol following the bolus administration of intravenous contrast. CONTRAST:  58m OMNIPAQUE IOHEXOL 300 MG/ML  SOLN COMPARISON:  CT neck June 16, 2017 and January 07, 2017. FINDINGS: Large body habitus results in overall noisy image quality. PHARYNX AND LARYNX: Normal.  Widely patent airway. SALIVARY GLANDS: Normal.  Small intraparotid lymph nodes. THYROID: Status post LEFT thyroidectomy.  Normal RIGHT thyroid lobe. LYMPH NODES: No lymphadenopathy by CT size criteria. Greater than expected number small stable cervical lymph nodes. VASCULAR: Normal. LIMITED INTRACRANIAL: Normal. VISUALIZED ORBITS: Normal. MASTOIDS AND VISUALIZED PARANASAL SINUSES: Well-aerated. SKELETON: Mild temporomandibular osteoarthrosis.  Nonacute. UPPER CHEST: Lung apices are clear. No superior mediastinal lymphadenopathy. OTHER: Recurrent 2.7 x 3.2 x 4.1 cm phlegmon midline paraspinal subcutaneous fat at C4 through C6 with central fluid density, surrounding subcutaneous fat stranding. No subcutaneous gas or radiopaque foreign bodies. IMPRESSION: 1. Recurrent posterior neck subcutaneous fat phlegmon with early abscess. Surrounding cellulitis. 2. No acute process in the anterior neck. Electronically Signed   By: CElon AlasM.D.   On: 10/26/2017 20:20     EKG:  Not done in ED, will get one.   Assessment/Plan Principal Problem:   Cellulitis and abscess of neck Active Problems:   Diabetes mellitus without complication (HCC)   Palpitations   Iron deficiency anemia   Cellulitis and abscess of neck: s/p of  incision drainage. No fever or leukocytosis.  Lactic acid normal.  Clinically not septic.  Patient had history of similar issues.  Chart review showed that wound culture was positive for Proteus mirabilis 01/2017.  - will place on lete for obs (due to hx of palpitations/tachycardia) - Empiric antimicrobial treatment with vancomycin, Rocephin and Flagyl (patient received 1 dose of Zosyn in ED)-->narrow down when culture results available - PRN Zofran for nausea, and Percocet for pain - Blood cultures x 2 and wound culture - ESR and CRP - wound care consult - IVF: 2 L of NS bolus in ED, followed by 125 cc/h - may need to consult general surgeon in AM  Diabetes [place] complication [place]: Last A1c 8.6 on 05/13/17, poorly controled. Patient is taking metformin at home. Blood sugar 507, normal AG, no DKA.  - pt was given 8  units of Novolog in ED -start Lantus 5 U daily -SSI  Hx of Palpitations due to sinus  Tachycardia: Heart rate 110s. -Continue metoprolol  Iron deficiency anemia: stable Hgb 11.8 -continue Iron supplement   DVT ppx: SCD Code Status: Full code Family Communication: None at bed side.     Disposition Plan:  Anticipate discharge back to previous home environment Consults called:  none Admission status:  tele/obs      Date of Service 10/26/2017    Ivor Costa [facility] Pager [phone]  If 7PM-7AM, please contact night-coverage [email or web address] Password [facility] 10/26/2017, 9:57 PM

## 2017-10-26 NOTE — ED Notes (Signed)
Patient transported to CT 

## 2017-10-27 ENCOUNTER — Other Ambulatory Visit: Payer: Self-pay

## 2017-10-27 ENCOUNTER — Encounter (HOSPITAL_COMMUNITY): Payer: Self-pay

## 2017-10-27 DIAGNOSIS — L03221 Cellulitis of neck: Secondary | ICD-10-CM | POA: Diagnosis not present

## 2017-10-27 DIAGNOSIS — L0291 Cutaneous abscess, unspecified: Secondary | ICD-10-CM | POA: Diagnosis not present

## 2017-10-27 DIAGNOSIS — L0390 Cellulitis, unspecified: Secondary | ICD-10-CM | POA: Diagnosis not present

## 2017-10-27 DIAGNOSIS — L0211 Cutaneous abscess of neck: Secondary | ICD-10-CM | POA: Diagnosis not present

## 2017-10-27 DIAGNOSIS — D62 Acute posthemorrhagic anemia: Secondary | ICD-10-CM | POA: Diagnosis not present

## 2017-10-27 DIAGNOSIS — Z6841 Body Mass Index (BMI) 40.0 and over, adult: Secondary | ICD-10-CM | POA: Diagnosis not present

## 2017-10-27 DIAGNOSIS — E119 Type 2 diabetes mellitus without complications: Secondary | ICD-10-CM | POA: Diagnosis not present

## 2017-10-27 LAB — BASIC METABOLIC PANEL
Anion gap: 9 (ref 5–15)
BUN: 6 mg/dL (ref 6–20)
CO2: 23 mmol/L (ref 22–32)
Calcium: 8.3 mg/dL — ABNORMAL LOW (ref 8.9–10.3)
Chloride: 108 mmol/L (ref 98–111)
Creatinine, Ser: 0.75 mg/dL (ref 0.44–1.00)
GFR calc Af Amer: >60 mL/min (ref >60)
GFR calc non Af Amer: >60 mL/min (ref >60)
Glucose, Bld: 321 mg/dL — ABNORMAL HIGH (ref 70–99)
Potassium: 3.6 mmol/L (ref 3.5–5.1)
Sodium: 140 mmol/L (ref 135–145)

## 2017-10-27 LAB — CBC
HCT: 33 % — ABNORMAL LOW (ref 36.0–46.0)
HEMOGLOBIN: 10.9 g/dL — AB (ref 12.0–15.0)
MCH: 26.8 pg (ref 26.0–34.0)
MCHC: 33 g/dL (ref 30.0–36.0)
MCV: 81.3 fL (ref 78.0–100.0)
Platelets: 260 10*3/uL (ref 150–400)
RBC: 4.06 MIL/uL (ref 3.87–5.11)
RDW: 14.1 % (ref 11.5–15.5)
WBC: 6.3 10*3/uL (ref 4.0–10.5)

## 2017-10-27 LAB — GLUCOSE, CAPILLARY
GLUCOSE-CAPILLARY: 286 mg/dL — AB (ref 70–99)
GLUCOSE-CAPILLARY: 337 mg/dL — AB (ref 70–99)
Glucose-Capillary: 322 mg/dL — ABNORMAL HIGH (ref 70–99)
Glucose-Capillary: 330 mg/dL — ABNORMAL HIGH (ref 70–99)
Glucose-Capillary: 325 mg/dL — ABNORMAL HIGH (ref 70–99)

## 2017-10-27 LAB — MRSA PCR SCREENING: MRSA BY PCR: NEGATIVE

## 2017-10-27 MED ORDER — INSULIN ASPART 100 UNIT/ML ~~LOC~~ SOLN
5.0000 [IU] | Freq: Once | SUBCUTANEOUS | Status: AC
  Administered 2017-10-27: 5 [IU] via SUBCUTANEOUS

## 2017-10-27 MED ORDER — VANCOMYCIN HCL 10 G IV SOLR
1250.0000 mg | Freq: Two times a day (BID) | INTRAVENOUS | Status: DC
  Administered 2017-10-27 – 2017-10-30 (×5): 1250 mg via INTRAVENOUS
  Filled 2017-10-27 (×7): qty 1250

## 2017-10-27 NOTE — Plan of Care (Signed)
°  Problem: Education: °Goal: Knowledge of General Education information will improve °Description: Including pain rating scale, medication(s)/side effects and non-pharmacologic comfort measures °Outcome: Progressing °  °Problem: Coping: °Goal: Level of anxiety will decrease °Outcome: Progressing °  °Problem: Elimination: °Goal: Will not experience complications related to bowel motility °Outcome: Progressing °  °Problem: Pain Managment: °Goal: General experience of comfort will improve °Outcome: Progressing °  °Problem: Safety: °Goal: Ability to remain free from injury will improve °Outcome: Progressing °  °

## 2017-10-27 NOTE — Consult Note (Signed)
WOC Nurse wound consult note Reason for Consult: recurrent abscess posterior neck Wound type: neck abscess, s/p I&D per [facility]  Pressure Injury POA: NA Measurement:0.3cm x0.4cm x unknown.  Wound NWG:NFAObed:pink with thick drainage at wound edge  Drainage (amount, consistency, odor)thick, yellow, no odor  Periwound: induration with erythema and pain with palpation   Dressing procedure/placement/frequency: Cover with silicone foam for now.  With recent CT scan will not ask bedside nursing staff to pack since it is so painful.   Would suggest consult to surgery while inpatient, they have been following this patient throughout her course of care for this and it was recommended by [facility] to have ED evaluation.   Discussed POC with patient and bedside nurse.  Re consult if needed, will not follow at this time. Thanks  Konstantin Lehnen M.D.[employer] MSN, RN,CWOCN, CNS, CWON-AP [phone])

## 2017-10-27 NOTE — Progress Notes (Signed)
PROGRESS NOTE  Jean Berry:[id number] DOB: 02-Jul-1965 DOA: 10/26/2017 PCP: Arnette Felts  HPI/Recap of past 24 hours: Jean Berry is a 52 y.o. female with medical history significant of diabetes mellitus, gout, iron deficiency anemia, obesity, palpitation/tachycardia, who presents with posterior neck pain and abscess.  Patient states that she has history of posterior neck abscess required I&D 01/2017.  She has been having posterior neck pain for about 10 days. She had subjective fevers/chills 3 days ago, but no fever or chills today. She was seen by PA in Dr. Dixon Boos office yesterday and had I&D for neck abscess. Dr. Lindie Spruce spoke to her on the phone yesterday and recommended that she be evaluated in the emergency room. She continues to have pain in the posterior neck, which is constant, 8 out of 10 severity, nonradiating.  10/27/2017: Patient examined at bedside.  Reports pain in back of her neck with purulent drainage from her neck abscess.    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 after midnight for possible procedure  Uncontrolled diabetes with hyperglycemia Blood sugar in the 500th Continue insulin Continue regular Accu-Cheks Obtain A1c  Morbid obesity BMI is 47 Recommend weight loss outpatient      Code Status: Full code  Family Communication: None at bedside  Disposition Plan: Home possibly tomorrow 10/28/2017   Consultants:  General  surgery  Procedures:  None  Antimicrobials:  IV vancomycin and IV ceftriaxone  DVT prophylaxis:  sq lovenox   Objective: Vitals:   10/26/17 2200 10/26/17 2245 10/27/17 0404 10/27/17 1313  BP:  134/73 117/62 124/63  Pulse: 98 94 87 85  Resp:  16 18 16   Temp:  98.3 F (36.8 C) 98.3 F (36.8 C) 98.1 F (36.7 C)  TempSrc:  Oral Oral Oral  SpO2: 98% 100% 100% 100%  Weight: 131.1 kg (289 lb)     Height:  5' 5.5" (1.664 m)      Intake/Output Summary (Last 24 hours) at 10/27/2017 1806 Last data filed at 10/27/2017 1500 Gross per 24 hour  Intake 2447.57 ml  Output -  Net 2447.57 ml   Filed Weights   10/26/17 2200  Weight: 131.1 kg (289 lb)    Exam:  . General: 52 y.o. year-old female morbidly obese in no acute distress.  Alert and oriented x3. Posterior neck abscess with purulent drainage. . Cardiovascular: Regular rate and rhythm with no rubs or gallops.  No thyromegaly or JVD noted.   Marland Kitchen Respiratory: Clear to auscultation with no wheezes or rales. Good inspiratory effort. . Abdomen: Soft nontender nondistended with normal bowel sounds x4 quadrants. . Musculoskeletal: No lower extremity edema. 2/4 pulses in all 4 extremities. . Skin: No ulcerative lesions noted or rashes, . Psychiatry: Mood is appropriate for condition and setting   Data Reviewed: CBC: Recent Labs  Lab 10/26/17 1636 10/27/17 0432  WBC 8.9 6.3  NEUTROABS 5.3  --   HGB 11.8* 10.9*  HCT 35.9* 33.0*  MCV 82.2 81.3  PLT 299 260   Basic Metabolic Panel: Recent Labs  Lab 10/26/17 1636 10/27/17 0432  NA 135  140  K 3.8 3.6  CL 99 108  CO2 24 23  GLUCOSE 507* 321*  BUN 8 6  CREATININE 0.81 0.75  CALCIUM 9.1 8.3*   GFR: Estimated Creatinine Clearance: 113.5 mL/min (by C-G formula based on SCr of 0.75 mg/dL). Liver Function Tests: No results for input(s): AST, ALT, ALKPHOS, BILITOT, PROT, ALBUMIN in the last 168 hours. No results for input(s): LIPASE, AMYLASE in the last 168 hours. No  results for input(s): AMMONIA in the last 168 hours. Coagulation Profile: Recent Labs  Lab 10/26/17 2158  INR 1.11   Cardiac Enzymes: No results for input(s): CKTOTAL, CKMB, CKMBINDEX, TROPONINI in the last 168 hours. BNP (last 3 results) No results for input(s): PROBNP in the last 8760 hours. HbA1C: No results for input(s): HGBA1C in the last 72 hours. CBG: Recent Labs  Lab 10/26/17 2251 10/27/17 0407 10/27/17 0609 10/27/17 1200 10/27/17 1602  GLUCAP 392* 286* 337* 322* 330*   Lipid Profile: No results for input(s): CHOL, HDL, LDLCALC, TRIG, CHOLHDL, LDLDIRECT in the last 72 hours. Thyroid Function Tests: No results for input(s): TSH, T4TOTAL, FREET4, T3FREE, THYROIDAB in the last 72 hours. Anemia Panel: No results for input(s): VITAMINB12, FOLATE, FERRITIN, TIBC, IRON, RETICCTPCT in the last 72 hours. Urine analysis: No results found for: COLORURINE, APPEARANCEUR, LABSPEC, PHURINE, GLUCOSEU, HGBUR, BILIRUBINUR, KETONESUR, PROTEINUR, UROBILINOGEN, NITRITE, LEUKOCYTESUR Sepsis Labs: @LABRCNTIP (procalcitonin:4,lacticidven:4)  ) Recent Results (from the past 240 hour(s))  Culture, blood (Routine X 2) w Reflex to ID Panel     Status: None (Preliminary result)   Collection Time: 10/26/17  9:50 PM  Result Value Ref Range Status   Specimen Description BLOOD LEFT ANTECUBITAL  Final   Special Requests   Final    BOTTLES DRAWN AEROBIC AND ANAEROBIC Blood Culture adequate volume   Culture   Final    NO GROWTH < 24 HOURS Performed at [facility], [address], [place], [place] [address]    Report Status PENDING  Incomplete  Culture, blood (Routine X 2) w Reflex to ID Panel     Status: None (Preliminary result)   Collection Time: 10/26/17  9:50 PM  Result Value Ref Range Status   Specimen Description BLOOD LEFT HAND  Final   Special Requests Blood Culture adequate volume  Final   Culture   Final    NO GROWTH < 24 HOURS Performed at [facility], [address], [place], [place] [address]    Report Status PENDING  Incomplete      Studies: Ct Soft Tissue Neck W Contrast  Result Date: 10/26/2017 CLINICAL DATA:  Posterior neck abscess for 2 weeks. EXAM: CT NECK WITH CONTRAST TECHNIQUE: Multidetector CT imaging of the neck was performed using the standard protocol following the bolus administration of intravenous contrast. CONTRAST:  75mL OMNIPAQUE IOHEXOL 300 MG/ML  SOLN COMPARISON:  CT neck June 16, 2017 and January 07, 2017. FINDINGS: Large body habitus results in overall noisy image quality. PHARYNX AND LARYNX: Normal.  Widely patent airway. SALIVARY GLANDS: Normal.  Small intraparotid lymph nodes. THYROID: Status post LEFT thyroidectomy.  Normal RIGHT thyroid lobe. LYMPH NODES: No lymphadenopathy by CT size criteria. Greater than expected number small stable cervical lymph nodes. VASCULAR: Normal. LIMITED INTRACRANIAL: Normal. VISUALIZED ORBITS: Normal. MASTOIDS AND VISUALIZED PARANASAL SINUSES: Well-aerated. SKELETON: Mild temporomandibular osteoarthrosis.  Nonacute. UPPER CHEST: Lung apices are clear. No superior mediastinal lymphadenopathy. OTHER: Recurrent 2.7 x 3.2 x 4.1 cm phlegmon midline paraspinal subcutaneous fat at C4 through C6 with central  fluid density, surrounding subcutaneous fat stranding. No subcutaneous gas or radiopaque foreign bodies. IMPRESSION: 1. Recurrent posterior neck subcutaneous fat phlegmon with early abscess. Surrounding cellulitis. 2. No acute process in the anterior neck. Electronically Signed   By: Awilda Metroourtnay  Bloomer M.D.   On: 10/26/2017 20:20    Scheduled Meds: . insulin aspart  0-9 Units Subcutaneous TID WC  . insulin glargine  5 Units Subcutaneous QHS  . loratadine  10 mg Oral Daily  . meloxicam  15 mg Oral Daily  . metoprolol tartrate  25 mg Oral BID  . multivitamin with minerals  1 tablet Oral Daily    Continuous Infusions: . sodium chloride 125 mL/hr at 10/27/17 1027  . cefTRIAXone (ROCEPHIN)  IV Stopped  (10/27/17 0130)  . vancomycin 1,250 mg (10/27/17 1730)     LOS: 0 days     Darlin Droparole N Katalia Choma, MD [facility] Pager [phone]  If 7PM-7AM, please contact night-coverage [email or web address] Password [email or web address] 10/27/2017, 6:06 PM

## 2017-10-27 NOTE — Progress Notes (Signed)
Subjective/Chief Complaint: Seen in our office 2 days ago. She has I and d of posterior neck abscess by Dr Cliffton Asters 10/18.  Seen again in February where she had [place] by PA that showed ill defined fluid collection measuring 5.1x2x2.4 cm.  She was better until a couple weeks ago when she had increased pain, fevers/chills. Was recommended by Dr Cliffton Asters to go to er but she came to office per request.  She was noted to have tender posterior neck and it was leaking clear fluid.  A block was done by Puja and attempted to aspirate. Finally she got about 3 cc of purulent dc out and then made a 2 cm incision but was not able to find cavity. She then  Recommended er and the patient came last night.  She is still draining and tender.    Objective: Vital signs in last 24 hours: Temp:  [98.1 F (36.7 C)-98.3 F (36.8 C)] 98.1 F (36.7 C) (07/25 1313) Pulse Rate:  [85-101] 85 (07/25 1313) Resp:  [16-18] 16 (07/25 1313) BP: (117-134)/(53-73) 124/63 (07/25 1313) SpO2:  [97 %-100 %] 100 % (07/25 1313) Weight:  [131.1 kg (289 lb)] 131.1 kg (289 lb) (07/24 2200) Last BM Date: 10/25/17  Intake/Output from previous day: 07/24 0701 - 07/25 0700 In: 2207.6 [I.V.:554.6; IV Piggyback:1653] Out: -  Intake/Output this shift: No intake/output data recorded.  Neck: posterior neck and upper back with tender area surrounding incision, draining some serosang fluid, no real celllulitis, tender throughout  Lab Results:  Recent Labs    10/26/17 1636 10/27/17 0432  WBC 8.9 6.3  HGB 11.8* 10.9*  HCT 35.9* 33.0*  PLT 299 260   BMET Recent Labs    10/26/17 1636 10/27/17 0432  NA 135 140  K 3.8 3.6  CL 99 108  CO2 24 23  GLUCOSE 507* 321*  BUN 8 6  CREATININE 0.81 0.75  CALCIUM 9.1 8.3*   PT/INR Recent Labs    10/26/17 2158  LABPROT 14.2  INR 1.11   ABG No results for input(s): PHART, HCO3 in the last 72 hours.  Invalid input(s): PCO2, PO2  Studies/Results: Ct Soft Tissue Neck W  Contrast  Result Date: 10/26/2017 CLINICAL DATA:  Posterior neck abscess for 2 weeks. EXAM: CT NECK WITH CONTRAST TECHNIQUE: Multidetector CT imaging of the neck was performed using the standard protocol following the bolus administration of intravenous contrast. CONTRAST:  75mL OMNIPAQUE IOHEXOL 300 MG/ML  SOLN COMPARISON:  CT neck June 16, 2017 and January 07, 2017. FINDINGS: Large body habitus results in overall noisy image quality. PHARYNX AND LARYNX: Normal.  Widely patent airway. SALIVARY GLANDS: Normal.  Small intraparotid lymph nodes. THYROID: Status post LEFT thyroidectomy.  Normal RIGHT thyroid lobe. LYMPH NODES: No lymphadenopathy by CT size criteria. Greater than expected number small stable cervical lymph nodes. VASCULAR: Normal. LIMITED INTRACRANIAL: Normal. VISUALIZED ORBITS: Normal. MASTOIDS AND VISUALIZED PARANASAL SINUSES: Well-aerated. SKELETON: Mild temporomandibular osteoarthrosis.  Nonacute. UPPER CHEST: Lung apices are clear. No superior mediastinal lymphadenopathy. OTHER: Recurrent 2.7 x 3.2 x 4.1 cm phlegmon midline paraspinal subcutaneous fat at C4 through C6 with central fluid density, surrounding subcutaneous fat stranding. No subcutaneous gas or radiopaque foreign bodies. IMPRESSION: 1. Recurrent posterior neck subcutaneous fat phlegmon with early abscess. Surrounding cellulitis. 2. No acute process in the anterior neck. Electronically Signed   By: Awilda Metro M.D.   On: 10/26/2017 20:20    Anti-infectives: Anti-infectives (From admission, onward)   Start     Dose/Rate Route Frequency Ordered  Stop   10/27/17 1700  vancomycin (VANCOCIN) 1,250 mg in sodium chloride 0.9 % 250 mL IVPB     1,250 mg 166.7 mL/hr over 90 Minutes Intravenous Every 12 hours 10/27/17 1140     10/27/17 0600  vancomycin (VANCOCIN) IVPB 1000 mg/200 mL premix  Status:  Discontinued     1,000 mg 200 mL/hr over 60 Minutes Intravenous Every 8 hours 10/26/17 2214 10/27/17 1140   10/26/17 2200   cefTRIAXone (ROCEPHIN) 2 g in sodium chloride 0.9 % 100 mL IVPB     2 g 200 mL/hr over 30 Minutes Intravenous Every 24 hours 10/26/17 2134     10/26/17 2200  metroNIDAZOLE (FLAGYL) IVPB 500 mg  Status:  Discontinued     500 mg 100 mL/hr over 60 Minutes Intravenous Every 8 hours 10/26/17 2134 10/27/17 1359   10/26/17 2145  vancomycin (VANCOCIN) IVPB 1000 mg/200 mL premix     1,000 mg 200 mL/hr over 60 Minutes Intravenous  Once 10/26/17 2143 10/26/17 2325   10/26/17 2030  piperacillin-tazobactam (ZOSYN) IVPB 3.375 g     3.375 g 100 mL/hr over 30 Minutes Intravenous  Once 10/26/17 2024 10/26/17 2153      Assessment/Plan: Posterior neck abscess Ct shows phlegmonous area with some fluid density. I think she just needs to go to or and have this widely opened at this point. She is npo after mn. On abx. She will get seen in am and then determine further therapy. I dont think she will tolerate anything bedside and this is going to be deep.  She needs glucose controlled better.    Emelia LoronMatthew Johncharles Fusselman 10/27/2017

## 2017-10-27 NOTE — Progress Notes (Signed)
Inpatient Diabetes Program Recommendations  AACE/ADA: New Consensus Statement on Inpatient Glycemic Control (2015)  Target Ranges:  Prepandial:   less than 140 mg/dL      Peak postprandial:   less than 180 mg/dL (1-2 hours)      Critically ill patients:  140 - 180 mg/dL   Results for Jean Berry, [name] (MRN [id number]) as of 10/27/2017 10:53  Ref. Range 10/26/2017 22:51 10/27/2017 04:07 10/27/2017 06:09  Glucose-Capillary Latest Ref Range: 70 - 99 mg/dL 401392 (H) 027286 (H) 253337 (H)   Review of Glycemic Control  Diabetes history: DM 2 Outpatient Diabetes medications: Metformin 2000 mg Daily Current orders for Inpatient glycemic control: Lantus 5 units qhs, Novolog 0-9 units tid  Inpatient Diabetes Program Recommendations:    Fasting glucose 337 on Lantus 5 units. Consider weight based insulin dosing, Lantus 20 units (0.15 units/kg). Consider increasing Novolog Correction to 0-15 units tid and add Novolog 0-5 units qhs.  Consider A1c level to determine glucose control over the past 2-3 months. Admitting glucose in the 500s. Anticipate patient will need insulin at time of d/c.  Thanks,  Christena DeemShannon Kiona Blume RN, MSN, BC-ADM, [facility] Inpatient Diabetes Coordinator Team Pager [phone] (8a-5p)

## 2017-10-28 ENCOUNTER — Encounter (HOSPITAL_COMMUNITY)
Admission: EM | Disposition: A | Discharge status: Home Health | Payer: Self-pay | Source: Home / Self Care | Attending: Internal Medicine

## 2017-10-28 ENCOUNTER — Inpatient Hospital Stay (HOSPITAL_COMMUNITY): Payer: 59 | Admitting: Certified Registered"

## 2017-10-28 ENCOUNTER — Encounter (HOSPITAL_COMMUNITY): Payer: Self-pay | Admitting: Orthopedic Surgery

## 2017-10-28 DIAGNOSIS — E119 Type 2 diabetes mellitus without complications: Secondary | ICD-10-CM | POA: Diagnosis not present

## 2017-10-28 DIAGNOSIS — L0390 Cellulitis, unspecified: Secondary | ICD-10-CM | POA: Diagnosis not present

## 2017-10-28 DIAGNOSIS — M170 Bilateral primary osteoarthritis of knee: Secondary | ICD-10-CM | POA: Diagnosis not present

## 2017-10-28 DIAGNOSIS — Z91030 Bee allergy status: Secondary | ICD-10-CM | POA: Diagnosis not present

## 2017-10-28 DIAGNOSIS — Z7984 Long term (current) use of oral hypoglycemic drugs: Secondary | ICD-10-CM | POA: Diagnosis not present

## 2017-10-28 DIAGNOSIS — M479 Spondylosis, unspecified: Secondary | ICD-10-CM | POA: Diagnosis present

## 2017-10-28 DIAGNOSIS — Z8249 Family history of ischemic heart disease and other diseases of the circulatory system: Secondary | ICD-10-CM | POA: Diagnosis not present

## 2017-10-28 DIAGNOSIS — L089 Local infection of the skin and subcutaneous tissue, unspecified: Secondary | ICD-10-CM | POA: Diagnosis not present

## 2017-10-28 DIAGNOSIS — R739 Hyperglycemia, unspecified: Secondary | ICD-10-CM

## 2017-10-28 DIAGNOSIS — L0211 Cutaneous abscess of neck: Secondary | ICD-10-CM | POA: Diagnosis not present

## 2017-10-28 DIAGNOSIS — Z91040 Latex allergy status: Secondary | ICD-10-CM | POA: Diagnosis not present

## 2017-10-28 DIAGNOSIS — G4730 Sleep apnea, unspecified: Secondary | ICD-10-CM | POA: Diagnosis present

## 2017-10-28 DIAGNOSIS — L03221 Cellulitis of neck: Secondary | ICD-10-CM | POA: Diagnosis not present

## 2017-10-28 DIAGNOSIS — Z801 Family history of malignant neoplasm of trachea, bronchus and lung: Secondary | ICD-10-CM | POA: Diagnosis not present

## 2017-10-28 DIAGNOSIS — Z833 Family history of diabetes mellitus: Secondary | ICD-10-CM | POA: Diagnosis not present

## 2017-10-28 DIAGNOSIS — M109 Gout, unspecified: Secondary | ICD-10-CM | POA: Diagnosis present

## 2017-10-28 DIAGNOSIS — E1165 Type 2 diabetes mellitus with hyperglycemia: Secondary | ICD-10-CM | POA: Diagnosis present

## 2017-10-28 DIAGNOSIS — L0291 Cutaneous abscess, unspecified: Secondary | ICD-10-CM | POA: Diagnosis not present

## 2017-10-28 DIAGNOSIS — D509 Iron deficiency anemia, unspecified: Secondary | ICD-10-CM | POA: Diagnosis present

## 2017-10-28 DIAGNOSIS — Z6841 Body Mass Index (BMI) 40.0 and over, adult: Secondary | ICD-10-CM | POA: Diagnosis not present

## 2017-10-28 DIAGNOSIS — D62 Acute posthemorrhagic anemia: Secondary | ICD-10-CM | POA: Diagnosis not present

## 2017-10-28 HISTORY — PX: IRRIGATION AND DEBRIDEMENT ABSCESS: SHX5252

## 2017-10-28 LAB — HEMOGLOBIN A1C
HEMOGLOBIN A1C: 11.3 % — AB (ref 4.8–5.6)
MEAN PLASMA GLUCOSE: 277.61 mg/dL

## 2017-10-28 LAB — GLUCOSE, CAPILLARY
GLUCOSE-CAPILLARY: 305 mg/dL — AB (ref 70–99)
Glucose-Capillary: 253 mg/dL — ABNORMAL HIGH (ref 70–99)
Glucose-Capillary: 199 mg/dL — ABNORMAL HIGH (ref 70–99)
Glucose-Capillary: 191 mg/dL — ABNORMAL HIGH (ref 70–99)
Glucose-Capillary: 318 mg/dL — ABNORMAL HIGH (ref 70–99)

## 2017-10-28 LAB — CBC
HEMATOCRIT: 34.9 % — AB (ref 36.0–46.0)
Hemoglobin: 11.1 g/dL — ABNORMAL LOW (ref 12.0–15.0)
MCH: 26.6 pg (ref 26.0–34.0)
MCHC: 31.8 g/dL (ref 30.0–36.0)
MCV: 83.5 fL (ref 78.0–100.0)
PLATELETS: 253 10*3/uL (ref 150–400)
RBC: 4.18 MIL/uL (ref 3.87–5.11)
RDW: 14.6 % (ref 11.5–15.5)
WBC: 5.8 10*3/uL (ref 4.0–10.5)

## 2017-10-28 SURGERY — IRRIGATION AND DEBRIDEMENT ABSCESS
Anesthesia: General | Site: Neck

## 2017-10-28 MED ORDER — DEXAMETHASONE SODIUM PHOSPHATE 10 MG/ML IJ SOLN
INTRAMUSCULAR | Status: DC | PRN
  Administered 2017-10-28: 10 mg via INTRAVENOUS

## 2017-10-28 MED ORDER — FENTANYL CITRATE (PF) 100 MCG/2ML IJ SOLN
25.0000 ug | INTRAMUSCULAR | Status: DC | PRN
  Administered 2017-10-28 (×3): 50 ug via INTRAVENOUS

## 2017-10-28 MED ORDER — PHENYLEPHRINE 40 MCG/ML (10ML) SYRINGE FOR IV PUSH (FOR BLOOD PRESSURE SUPPORT)
PREFILLED_SYRINGE | INTRAVENOUS | Status: AC
  Filled 2017-10-28: qty 10

## 2017-10-28 MED ORDER — LACTATED RINGERS IV SOLN
INTRAVENOUS | Status: DC | PRN
  Administered 2017-10-28: 17:00:00 via INTRAVENOUS

## 2017-10-28 MED ORDER — LACTATED RINGERS IV SOLN
INTRAVENOUS | Status: DC
Start: 2017-10-28 — End: 2017-10-31
  Administered 2017-10-28: 16:00:00 via INTRAVENOUS

## 2017-10-28 MED ORDER — FENTANYL CITRATE (PF) 250 MCG/5ML IJ SOLN
INTRAMUSCULAR | Status: AC
  Filled 2017-10-28: qty 5

## 2017-10-28 MED ORDER — ROCURONIUM BROMIDE 100 MG/10ML IV SOLN
INTRAVENOUS | Status: DC | PRN
  Administered 2017-10-28: 20 mg via INTRAVENOUS
  Administered 2017-10-28: 30 mg via INTRAVENOUS

## 2017-10-28 MED ORDER — SUCCINYLCHOLINE CHLORIDE 20 MG/ML IJ SOLN
INTRAMUSCULAR | Status: DC | PRN
  Administered 2017-10-28: 140 mg via INTRAVENOUS

## 2017-10-28 MED ORDER — SCOPOLAMINE 1 MG/3DAYS TD PT72
MEDICATED_PATCH | TRANSDERMAL | Status: AC
  Filled 2017-10-28: qty 1

## 2017-10-28 MED ORDER — FENTANYL CITRATE (PF) 100 MCG/2ML IJ SOLN
INTRAMUSCULAR | Status: AC
  Filled 2017-10-28: qty 2

## 2017-10-28 MED ORDER — MIDAZOLAM HCL 5 MG/5ML IJ SOLN
INTRAMUSCULAR | Status: DC | PRN
  Administered 2017-10-28: 2 mg via INTRAVENOUS

## 2017-10-28 MED ORDER — PROPOFOL 10 MG/ML IV BOLUS
INTRAVENOUS | Status: DC | PRN
  Administered 2017-10-28: 170 mg via INTRAVENOUS

## 2017-10-28 MED ORDER — OXYCODONE HCL 5 MG PO TABS: 5.0000 mg | ORAL_TABLET | Freq: Once | ORAL | Status: DC | PRN

## 2017-10-28 MED ORDER — BUPIVACAINE-EPINEPHRINE (PF) 0.25% -1:200000 IJ SOLN
INTRAMUSCULAR | Status: DC | PRN
  Administered 2017-10-28: 18 mL

## 2017-10-28 MED ORDER — LIDOCAINE HCL (CARDIAC) PF 100 MG/5ML IV SOSY
PREFILLED_SYRINGE | INTRAVENOUS | Status: DC | PRN
  Administered 2017-10-28: 100 mg via INTRAVENOUS

## 2017-10-28 MED ORDER — OXYCODONE HCL 5 MG/5ML PO SOLN: 5.0000 mg | Freq: Once | ORAL | Status: DC | PRN

## 2017-10-28 MED ORDER — SCOPOLAMINE 1 MG/3DAYS TD PT72
MEDICATED_PATCH | TRANSDERMAL | Status: DC | PRN
  Administered 2017-10-28: 1 via TRANSDERMAL

## 2017-10-28 MED ORDER — MIDAZOLAM HCL 2 MG/2ML IJ SOLN
INTRAMUSCULAR | Status: AC
  Filled 2017-10-28: qty 2

## 2017-10-28 MED ORDER — SODIUM CHLORIDE 0.9 % IR SOLN
Status: DC | PRN
  Administered 2017-10-28: 1000 mL

## 2017-10-28 MED ORDER — ONDANSETRON HCL 4 MG/2ML IJ SOLN: 4.0000 mg | Freq: Four times a day (QID) | INTRAMUSCULAR | Status: DC | PRN

## 2017-10-28 MED ORDER — FENTANYL CITRATE (PF) 100 MCG/2ML IJ SOLN
INTRAMUSCULAR | Status: DC | PRN
  Administered 2017-10-28 (×2): 50 ug via INTRAVENOUS
  Administered 2017-10-28: 100 ug via INTRAVENOUS

## 2017-10-28 MED ORDER — SUGAMMADEX SODIUM 200 MG/2ML IV SOLN
INTRAVENOUS | Status: DC | PRN
  Administered 2017-10-28: 400 mg via INTRAVENOUS

## 2017-10-28 MED ORDER — SUCCINYLCHOLINE CHLORIDE 200 MG/10ML IV SOSY
PREFILLED_SYRINGE | INTRAVENOUS | Status: AC
  Filled 2017-10-28: qty 10

## 2017-10-28 MED ORDER — BUPIVACAINE-EPINEPHRINE (PF) 0.25% -1:200000 IJ SOLN
INTRAMUSCULAR | Status: AC
  Filled 2017-10-28: qty 30

## 2017-10-28 MED ORDER — ROCURONIUM BROMIDE 10 MG/ML (PF) SYRINGE
PREFILLED_SYRINGE | INTRAVENOUS | Status: AC
  Filled 2017-10-28: qty 10

## 2017-10-28 MED ORDER — LIDOCAINE 2% (20 MG/ML) 5 ML SYRINGE
INTRAMUSCULAR | Status: AC
  Filled 2017-10-28: qty 5

## 2017-10-28 SURGICAL SUPPLY — 37 items
BLADE CLIPPER SURG (BLADE) IMPLANT
BNDG GAUZE ELAST 4 BULKY (GAUZE/BANDAGES/DRESSINGS) ×3 IMPLANT
CANISTER SUCT 3000ML PPV (MISCELLANEOUS) ×3 IMPLANT
CHLORAPREP W/TINT 26ML (MISCELLANEOUS) IMPLANT
COVER SURGICAL LIGHT HANDLE (MISCELLANEOUS) ×3 IMPLANT
DRAPE LAPAROSCOPIC ABDOMINAL (DRAPES) ×3 IMPLANT
DRAPE UTILITY XL STRL (DRAPES) ×6 IMPLANT
ELECT CAUTERY BLADE 6.4 (BLADE) ×3 IMPLANT
ELECT REM PT RETURN 9FT ADLT (ELECTROSURGICAL) ×3
ELECTRODE REM PT RTRN 9FT ADLT (ELECTROSURGICAL) ×1 IMPLANT
GAUZE SPONGE 4X4 12PLY STRL (GAUZE/BANDAGES/DRESSINGS) ×3 IMPLANT
GLOVE BIOGEL PI IND STRL 8 (GLOVE) ×1 IMPLANT
GLOVE BIOGEL PI INDICATOR 8 (GLOVE) ×2
GLOVE SURG SS PI 7.0 STRL IVOR (GLOVE) ×3 IMPLANT
GLOVE SURG SS PI 7.5 STRL IVOR (GLOVE) ×6 IMPLANT
GLOVE SURG SS PI 8.0 STRL IVOR (GLOVE) ×3 IMPLANT
GOWN STRL REUS W/ TWL LRG LVL3 (GOWN DISPOSABLE) ×1 IMPLANT
GOWN STRL REUS W/ TWL XL LVL3 (GOWN DISPOSABLE) ×1 IMPLANT
GOWN STRL REUS W/TWL LRG LVL3 (GOWN DISPOSABLE) ×2
GOWN STRL REUS W/TWL XL LVL3 (GOWN DISPOSABLE) ×2
KIT BASIN OR (CUSTOM PROCEDURE TRAY) ×3 IMPLANT
KIT TURNOVER KIT B (KITS) ×3 IMPLANT
NEEDLE HYPO 25GX1X1/2 BEV (NEEDLE) ×3 IMPLANT
NS IRRIG 1000ML POUR BTL (IV SOLUTION) ×3 IMPLANT
PACK SURGICAL SETUP 50X90 (CUSTOM PROCEDURE TRAY) ×3 IMPLANT
PAD ARMBOARD 7.5X6 YLW CONV (MISCELLANEOUS) ×6 IMPLANT
PENCIL BUTTON HOLSTER BLD 10FT (ELECTRODE) ×3 IMPLANT
SPONGE LAP 18X18 X RAY DECT (DISPOSABLE) ×3 IMPLANT
SWAB COLLECTION DEVICE MRSA (MISCELLANEOUS) IMPLANT
SWAB CULTURE ESWAB REG 1ML (MISCELLANEOUS) IMPLANT
SYR 10ML LL (SYRINGE) ×3 IMPLANT
SYR BULB IRRIGATION 50ML (SYRINGE) ×3 IMPLANT
TOWEL OR 17X24 6PK STRL BLUE (TOWEL DISPOSABLE) ×3 IMPLANT
TOWEL OR 17X26 10 PK STRL BLUE (TOWEL DISPOSABLE) ×3 IMPLANT
TUBE CONNECTING 12'X1/4 (SUCTIONS) ×1
TUBE CONNECTING 12X1/4 (SUCTIONS) ×2 IMPLANT
YANKAUER SUCT BULB TIP NO VENT (SUCTIONS) ×3 IMPLANT

## 2017-10-28 NOTE — Progress Notes (Signed)
[facility] Progress Note     Subjective: CC: pain in neck Pain and swelling in posterior neck. Still draining some. Concerned that blood glucose was 500 in ER last night, she reports it usually runs from 120-170s.   Objective: Vital signs in last 24 hours: Temp:  [97.8 F (36.6 C)-98.1 F (36.7 C)] 97.8 F (36.6 C) (07/26 0452) Pulse Rate:  [74-85] 74 (07/26 0452) Resp:  [16-18] 16 (07/26 0452) BP: (109-124)/(44-63) 109/57 (07/26 0452) SpO2:  [98 %-100 %] 99 % (07/26 0452) Last BM Date: 10/25/17  Intake/Output from previous day: 07/25 0701 - 07/26 0700 In: 2219 [P.O.:360; I.V.:1621; IV Piggyback:238] Out: -  Intake/Output this shift: No intake/output data recorded.  PE: Gen:  Alert, NAD, pleasant Neck: posterior neck incision 1 cm open draining purulent material, surrounding induration present Card:  Regular rate and rhythm Pulm:  Normal effort, clear to auscultation bilaterally Abd: Soft, non-tender, non-distended Psych: A&Ox3   Lab Results:  Recent Labs    10/27/17 0432 10/28/17 0430  WBC 6.3 5.8  HGB 10.9* 11.1*  HCT 33.0* 34.9*  PLT 260 253   BMET Recent Labs    10/26/17 1636 10/27/17 0432  NA 135 140  K 3.8 3.6  CL 99 108  CO2 24 23  GLUCOSE 507* 321*  BUN 8 6  CREATININE 0.81 0.75  CALCIUM 9.1 8.3*   PT/INR Recent Labs    10/26/17 2158  LABPROT 14.2  INR 1.11   CMP     Component Value Date/Time   NA 140 10/27/2017 0432   K 3.6 10/27/2017 0432   CL 108 10/27/2017 0432   CO2 23 10/27/2017 0432   GLUCOSE 321 (H) 10/27/2017 0432   BUN 6 10/27/2017 0432   CREATININE 0.75 10/27/2017 0432   CALCIUM 8.3 (L) 10/27/2017 0432   PROT 6.7 01/07/2017 1825   ALBUMIN 3.2 (L) 01/07/2017 1825   AST 25 01/07/2017 1825   ALT 27 01/07/2017 1825   ALKPHOS 58 01/07/2017 1825   BILITOT 0.5 01/07/2017 1825   GFRNONAA >60 10/27/2017 0432   GFRAA >60 10/27/2017 0432   Lipase  No results found for: LIPASE     Studies/Results: Ct  Soft Tissue Neck W Contrast  Result Date: 10/26/2017 CLINICAL DATA:  Posterior neck abscess for 2 weeks. EXAM: CT NECK WITH CONTRAST TECHNIQUE: Multidetector CT imaging of the neck was performed using the standard protocol following the bolus administration of intravenous contrast. CONTRAST:  75mL OMNIPAQUE IOHEXOL 300 MG/ML  SOLN COMPARISON:  CT neck June 16, 2017 and January 07, 2017. FINDINGS: Large body habitus results in overall noisy image quality. PHARYNX AND LARYNX: Normal.  Widely patent airway. SALIVARY GLANDS: Normal.  Small intraparotid lymph nodes. THYROID: Status post LEFT thyroidectomy.  Normal RIGHT thyroid lobe. LYMPH NODES: No lymphadenopathy by CT size criteria. Greater than expected number small stable cervical lymph nodes. VASCULAR: Normal. LIMITED INTRACRANIAL: Normal. VISUALIZED ORBITS: Normal. MASTOIDS AND VISUALIZED PARANASAL SINUSES: Well-aerated. SKELETON: Mild temporomandibular osteoarthrosis.  Nonacute. UPPER CHEST: Lung apices are clear. No superior mediastinal lymphadenopathy. OTHER: Recurrent 2.7 x 3.2 x 4.1 cm phlegmon midline paraspinal subcutaneous fat at C4 through C6 with central fluid density, surrounding subcutaneous fat stranding. No subcutaneous gas or radiopaque foreign bodies. IMPRESSION: 1. Recurrent posterior neck subcutaneous fat phlegmon with early abscess. Surrounding cellulitis. 2. No acute process in the anterior neck. Electronically Signed   By: Awilda Metro M.D.   On: 10/26/2017 20:20    Anti-infectives: Anti-infectives (From admission, onward)   Start  Dose/Rate Route Frequency Ordered Stop   10/27/17 1700  vancomycin (VANCOCIN) 1,250 mg in sodium chloride 0.9 % 250 mL IVPB     1,250 mg 166.7 mL/hr over 90 Minutes Intravenous Every 12 hours 10/27/17 1140     10/27/17 0600  vancomycin (VANCOCIN) IVPB 1000 mg/200 mL premix  Status:  Discontinued     1,000 mg 200 mL/hr over 60 Minutes Intravenous Every 8 hours 10/26/17 2214 10/27/17 1140    10/26/17 2200  cefTRIAXone (ROCEPHIN) 2 g in sodium chloride 0.9 % 100 mL IVPB     2 g 200 mL/hr over 30 Minutes Intravenous Every 24 hours 10/26/17 2134     10/26/17 2200  metroNIDAZOLE (FLAGYL) IVPB 500 mg  Status:  Discontinued     500 mg 100 mL/hr over 60 Minutes Intravenous Every 8 hours 10/26/17 2134 10/27/17 1359   10/26/17 2145  vancomycin (VANCOCIN) IVPB 1000 mg/200 mL premix     1,000 mg 200 mL/hr over 60 Minutes Intravenous  Once 10/26/17 2143 10/26/17 2325   10/26/17 2030  piperacillin-tazobactam (ZOSYN) IVPB 3.375 g     3.375 g 100 mL/hr over 30 Minutes Intravenous  Once 10/26/17 2024 10/26/17 2153       Assessment/Plan Morbid Obesity - BMI 47 Uncontrolled Type II Diabetes mellitus - blood glucose in the 300s today, A1c pending  Posterior Neck Abscess - WBC 5.8, afebrile - I&D attempted in the office 2 days ago - still draining some, but tender and indurated - plan for more extensive I&D in the OR today  FEN: NPO, IVF VTE: SCDs ID: flagyl/zosyn 7/24; vanc/rocephin 7/24>>  LOS: 0 days    Wells GuilesKelly Rayburn , [facility] 10/28/2017, 8:43 AM Pager: [phone] Consults: [phone] Mon-Fri 7:00 am-4:30 pm Sat-Sun 7:00 am-11:30 am

## 2017-10-28 NOTE — Op Note (Signed)
Preoperative Diagnosis: Chronic abscess posterior neck  Postoprative Diagnosis: Same  Procedure: Procedure(s): Incision, drainage and debridement of posterior neck abscess   Surgeon: Glenna FellowsHoxworth, Malaney Mcbean T   Assistants: None  Anesthesia:  General endotracheal anesthesia  Indications: Patient is a morbidly obese diabetic female with a history of a posterior neck abscess at the nape of her neck who underwent incision drainage and debridement a number of months ago.  She initially did well but has developed recurrent swelling and pain and evidence of infection and was admitted to the medical service.  A CT scan has shown a abscess/phlegmon in her posterior neck deep just anterior to the spinous processes measuring about 4 x 5 cm.  Examination reveals a chronically draining sinus tract in this area with some surrounding induration and tenderness and mild erythema.  We have recommended repeat incision drainage and debridement of this area.  I discussed the procedure and its indications and nature and risks of anesthetic complications, bleeding, infection and prolonged wound healing.  She understands and agrees to proceed.    Procedure Detail: Patient was brought to the operating room and general endotracheal anesthesia was induced on the stretcher.  She was carefully rolled and positioned in prone position.  The posterior neck was widely sterilely prepped and draped.  She was already on IV antibiotics.  Patient timeout was performed and correct procedure verified.  Exposure was somewhat difficult and I suspect also likely the cause of her recurrent abscess as this was deep in the posterior fold just below the nape of her neck with a large fold of skin and fatty tissue over the top of this inferiorly.  The draining sinus was exposed and I elliptically excised an approximately 5 x 1-1/2 cm area of skin surrounding this transversely and dissection was carried down to the subcutaneous tissue with cautery.  As  I deepened the dissection I was able to break through with my finger down into a deep cavity several centimeters in diameter just anterior to the fascia.  There was a small amount of purulent material.  The specimen was continued anteriorly until I had excised this and totally unroofed the deep cavity.  There did not appear to be in any other tracking or necrotic tissue.  The soft tissue was infiltrated with Marcaine.  The cavity was thoroughly irrigated.  The wound at this point measures about 5 x 2 x 5 cm.  It was packed with moist Kerlix gauze.  Sponge needle instrument counts were correct.  Dry sterile dressing was applied.   Findings: Above  Estimated Blood Loss:  less than 50 mL         Drains: Wound packed open with moist Kerlix  Blood Given: none          Specimens: Skin and subcutaneous tissue        Complications:  * No complications entered in OR log *         Disposition: PACU - hemodynamically stable.         Condition: stable

## 2017-10-28 NOTE — Plan of Care (Addendum)
  RD consulted for nutrition education regarding diabetes.   Lab Results  Component Value Date   HGBA1C 11.3 (H) 10/28/2017   CBGS: 253-325 (inpatient orders for glycemic control are 0-9 units insulin aspart TID and 5 units insulin glargine daily). PTA DM medications 500 mg metformin QID.   Spoke with pt at bedside, who reports frustration over current diet plan. She reports "nothing that I do works"; she shares that she has actively been trying to control her DM as well as lose weight ([facility] She tries to eliminate carbs from her diet when possible; she consumes 3 meals per day- Breakfast: lowfat vanilla yogurt, granola, and fruit, and Lunch/Dinner: salad with grilled chicken and fish. Pt consumes mainly water. She has been trying to add more fresh fruits and vegetables in her diet.   Actively listened to pt's concerns regarding glycemic control and weight loss and provided emotional support. Encouraged pt to add complex carbohydrates in her diet. Encouraged continued physical activity and importance of good DM self-management. Pt has been referred to [place] [facility]- pt is very grateful for this referral.   RD provided "Carbohydrate Counting for People with Diabetes" handout from the Academy of Nutrition and Dietetics. Discussed different food groups and their effects on blood sugar, emphasizing carbohydrate-containing foods. Provided list of carbohydrates and recommended serving sizes of common foods.  Discussed importance of controlled and consistent carbohydrate intake throughout the day. Provided examples of ways to balance meals/snacks and encouraged intake of high-fiber, whole grain complex carbohydrates. Teach back method used.  Expect good compliance.  Body mass index is 47.34 kg/m. Pt meets criteria for extreme obesity, class III based on current BMI.  Current diet order is carb modified, patient is  consuming approximately 100% of meals at this time. Labs and medications reviewed. No further nutrition interventions warranted at this time. RD contact information provided. If additional nutrition issues arise, please re-consult RD.  [place]al[place] Arch A. Mayford KnifeWilliams, RD, LDN, CDE Pager: [phone] After hours Pager: [phone]

## 2017-10-28 NOTE — Transfer of Care (Addendum)
Immediate Anesthesia Transfer of Care Note  Patient: Jean HeightLetitia Berry  Procedure(s) Performed: IRRIGATION AND DEBRIDEMENT OF NECK ABSCESS (N/A Neck)  Patient Location: PACU  Anesthesia Type:General  Level of Consciousness: awake, alert  and oriented  Airway & Oxygen Therapy: Patient Spontanous Breathing and Patient connected to face mask oxygen  Post-op Assessment: Report given to RN and Post -op Vital signs reviewed and stable  Post vital signs: Reviewed and stable  Last Vitals:  Vitals Value Taken Time  BP    Temp    Pulse 100 10/28/2017  6:00 PM  Resp 13 10/28/2017  6:00 PM  SpO2 98 % 10/28/2017  6:00 PM  Vitals shown include unvalidated device data.  Last Pain:  Vitals:   10/28/17 1246  TempSrc: Oral  PainSc:          Complications: No apparent anesthesia complications

## 2017-10-28 NOTE — Progress Notes (Addendum)
Inpatient Diabetes Program Recommendations  AACE/ADA: New Consensus Statement on Inpatient Glycemic Control (2015)  Target Ranges:  Prepandial:   less than 140 mg/dL      Peak postprandial:   less than 180 mg/dL (1-2 hours)      Critically ill patients:  140 - 180 mg/dL   Spoke with patient about diabetes and home regimen for diabetes control. Patient reports that she is followed by her PCP for diabetes management and currently takes only Metformin. The last time she saw her PCP was 1 year ago and her A1c was 6.7%.  Patient reports having 2 episodes of her abscess in her neck since October 2018. Patient also reports a steroid taper about 1 months ago due to back and hip pain. Patient reports taking Victoza in the past but could not afford it.   Patient is compliant with diet and eats only 30 grams of carbs a meal. Patient reports frustration due to not being able to loose weight. Patient is in the process for the appointments to have bariatric surgery.   Discussed A1C results 11.3% this admission. Discussed glucose and A1C goals. Discussed importance of checking CBGs and maintaining good CBG control to prevent long-term and short-term complications. Explained how hyperglycemia leads to damage within blood vessels which lead to the common complications seen with uncontrolled diabetes.   Discussed possible need for insulin at time of d/c. Patient had been on Victoza injections at one time and insulin injections are very similar.   Educated patient on insulin pen use at home. Reviewed contents of insulin flexpen starter kit. Reviewed all steps if insulin pen including attachment of needle, 2-unit air shot, dialing up dose, giving injection, removing needle, disposal of sharps, storage of unused insulin, disposal of insulin etc. Patient able to provide successful return demonstration. Also reviewed troubleshooting with insulin pen.   Patient requests ambulatory referral for DM education outpatient.  Will also order dietitian consult for inpatient as well.  Thanks,  Tama Headings RN, MSN, [facility] Inpatient Diabetes Coordinator Team Pager [phone] (8a-5p)

## 2017-10-28 NOTE — Progress Notes (Signed)
PROGRESS NOTE  Jean HeightLetitia Stumpo ZOX:[id number] DOB: June 15, 1965 DOA: 10/26/2017 PCP: [name], [name]  HPI/Recap of past 24 hours: Jean Berry is a 52 y.o. female with medical history significant of diabetes mellitus, gout, iron deficiency anemia, obesity, palpitation/tachycardia, who presents with posterior neck pain and abscess.  Patient states that she has history of posterior neck abscess required I&D 01/2017.  She has been having posterior neck pain for about 10 days. She had subjective fevers/chills 3 days ago, but no fever or chills today. She was seen by PA in Dr. Dixon BoosWyatt's office yesterday and had I&D for neck abscess. Dr. Lindie SpruceWyatt spoke to her on the phone yesterday and recommended that she be evaluated in the emergency room. She continues to have pain in the posterior neck, which is constant, 8 out of 10 severity, nonradiating.  10/27/2017: Patient examined at bedside.  Reports pain in back of her neck with purulent drainage from her neck abscess.  10/28/17: In good spirits this morning. Pain is well controlled on current management.    Assessment/Plan: Principal Problem:   Cellulitis and abscess of neck Active Problems:   Diabetes mellitus without complication (HCC)   Palpitations   Iron deficiency anemia  Recurrent posterior neck abscess status post recent I&D 1 day prior to admission Self-reported abscess present since 2018 CT neck with contrast revealed recurrent posterior neck subcutaneous fat phlegmon with early abscess and surrounding cellulitis Afebrile with no leukocytosis Continue empiric IV antibiotics MRSA screening negative Gen surgery following. Plan for I&D in the OR today C/w pain management and bowel regimen  Uncontrolled diabetes with hyperglycemia Blood sugar in the 500th A1C 11.3 Continue insulin Continue regular Accu-Cheks Diabetes coordinator following  Morbid obesity BMI is 47 Recommend weight loss outpatient      Code Status: Full  code  Family Communication: None at bedside  Disposition Plan: Home possibly tomorrow 10/29/2017   Consultants:  General surgery  Procedures:  None  Antimicrobials:  IV vancomycin and IV ceftriaxone  DVT prophylaxis:  sq lovenox   Objective: Vitals:   10/27/17 2054 10/27/17 2330 10/28/17 0452 10/28/17 1246  BP: (!) 109/44  (!) 109/57 (!) 116/52  Pulse: 85 79 74 72  Resp: 17 18 16 20   Temp: 98 F (36.7 C)  97.8 F (36.6 C) 98.2 F (36.8 C)  TempSrc: Oral  Oral Oral  SpO2: 100% 98% 99% 99%  Weight:      Height:        Intake/Output Summary (Last 24 hours) at 10/28/2017 1517 Last data filed at 10/27/2017 1856 Gross per 24 hour  Intake 1979.04 ml  Output -  Net 1979.04 ml   Filed Weights   10/26/17 2200  Weight: 131.1 kg (289 lb)    Exam:  . General: 52 y.o. year-old female Morbidly obese NAD A&O x 3 . Cardiovascular: RRR no rubs or gallops. No JVD or thyromegaly. Marland Kitchen. Respiratory: Clear to auscultation with no wheezes or rales. Good inspiratory effort. . Abdomen: Soft nontender nondistended with normal bowel sounds x4 quadrants. . Musculoskeletal: No lower extremity edema. 2/4 pulses in all 4 extremities. . Skin: Posterior neck abscess with purulent drainage . Psychiatry: Mood is appropriate for condition and setting   Data Reviewed: CBC: Recent Labs  Lab 10/26/17 1636 10/27/17 0432 10/28/17 0430  WBC 8.9 6.3 5.8  NEUTROABS 5.3  --   --   HGB 11.8* 10.9* 11.1*  HCT 35.9* 33.0* 34.9*  MCV 82.2 81.3 83.5  PLT 299 260 253   Basic Metabolic Panel:  Recent Labs  Lab 10/26/17 1636 10/27/17 0432  NA 135 140  K 3.8 3.6  CL 99 108  CO2 24 23  GLUCOSE 507* 321*  BUN 8 6  CREATININE 0.81 0.75  CALCIUM 9.1 8.3*   GFR: Estimated Creatinine Clearance: 113.5 mL/min (by C-G formula based on SCr of 0.75 mg/dL). Liver Function Tests: No results for input(s): AST, ALT, ALKPHOS, BILITOT, PROT, ALBUMIN in the last 168 hours. No results for input(s):  LIPASE, AMYLASE in the last 168 hours. No results for input(s): AMMONIA in the last 168 hours. Coagulation Profile: Recent Labs  Lab 10/26/17 2158  INR 1.11   Cardiac Enzymes: No results for input(s): CKTOTAL, CKMB, CKMBINDEX, TROPONINI in the last 168 hours. BNP (last 3 results) No results for input(s): PROBNP in the last 8760 hours. HbA1C: Recent Labs    10/28/17 0430  HGBA1C 11.3*   CBG: Recent Labs  Lab 10/27/17 1602 10/27/17 2155 10/28/17 0635 10/28/17 1218 10/28/17 1503  GLUCAP 330* 325* 305* 253* 199*   Lipid Profile: No results for input(s): CHOL, HDL, LDLCALC, TRIG, CHOLHDL, LDLDIRECT in the last 72 hours. Thyroid Function Tests: No results for input(s): TSH, T4TOTAL, FREET4, T3FREE, THYROIDAB in the last 72 hours. Anemia Panel: No results for input(s): VITAMINB12, FOLATE, FERRITIN, TIBC, IRON, RETICCTPCT in the last 72 hours. Urine analysis: No results found for: COLORURINE, APPEARANCEUR, LABSPEC, PHURINE, GLUCOSEU, HGBUR, BILIRUBINUR, KETONESUR, PROTEINUR, UROBILINOGEN, NITRITE, LEUKOCYTESUR Sepsis Labs: @LABRCNTIP (procalcitonin:4,lacticidven:4)  ) Recent Results (from the past 240 hour(s))  Culture, blood (Routine X 2) w Reflex to ID Panel     Status: None (Preliminary result)   Collection Time: 10/26/17  9:50 PM  Result Value Ref Range Status   Specimen Description BLOOD LEFT ANTECUBITAL  Final   Special Requests   Final    BOTTLES DRAWN AEROBIC AND ANAEROBIC Blood Culture adequate volume   Culture   Final    NO GROWTH 2 DAYS Performed at [facility], [address], [place], [place] [address]    Report Status PENDING  Incomplete  Culture, blood (Routine X 2) w Reflex to ID Panel     Status: None (Preliminary result)   Collection Time: 10/26/17  9:50 PM  Result Value Ref Range Status   Specimen Description BLOOD LEFT HAND  Final   Special Requests Blood Culture adequate volume  Final   Culture   Final    NO GROWTH 2 DAYS Performed at  [facility], [address] [place], [place] [address]    Report Status PENDING  Incomplete  MRSA PCR Screening     Status: None   Collection Time: 10/27/17  6:20 PM  Result Value Ref Range Status   MRSA by PCR NEGATIVE NEGATIVE Final    Comment:        The GeneXpert MRSA Assay (FDA approved for NASAL specimens only), is one component of a comprehensive MRSA colonization surveillance program. It is not intended to diagnose MRSA infection nor to guide or monitor treatment for MRSA infections. Performed at [facility], [address], [place], [place] [address]       Studies: No results found.  Scheduled Meds: . insulin aspart  0-9 Units Subcutaneous TID WC  . insulin glargine  5 Units Subcutaneous QHS  . loratadine  10 mg Oral Daily  . meloxicam  15 mg Oral Daily  . metoprolol tartrate  25 mg Oral BID  . multivitamin with minerals  1 tablet Oral Daily  Continuous Infusions: . sodium chloride 75 mL/hr at 10/27/17 2104  . cefTRIAXone (ROCEPHIN)  IV 2 g (10/27/17 2112)  . vancomycin 1,250 mg (10/28/17 0549)     LOS: 0 days     Darlin Drop, MD [facility] Pager [phone]  If 7PM-7AM, please contact night-coverage [email or web address] Password [facility] 10/28/2017, 3:17 PM

## 2017-10-28 NOTE — Anesthesia Procedure Notes (Signed)
Procedure Name: Intubation Date/Time: 10/28/2017 5:03 PM Performed by: Catalina Gravel, MD Pre-anesthesia Checklist: Patient identified, Emergency Drugs available, Suction available and Patient being monitored Patient Re-evaluated:Patient Re-evaluated prior to induction Oxygen Delivery Method: Circle system utilized Preoxygenation: Pre-oxygenation with 100% oxygen Induction Type: IV induction Ventilation: Mask ventilation without difficulty Laryngoscope Size: Mac and 3 Grade View: Grade IV Tube type: Oral Tube size: 7.0 mm Number of attempts: 1 Airway Equipment and Method: Stylet and Oral airway Placement Confirmation: ETT inserted through vocal cords under direct vision,  positive ETCO2 and breath sounds checked- equal and bilateral Secured at: 22 cm Tube secured with: Tape Dental Injury: Teeth and Oropharynx as per pre-operative assessment  Difficulty Due To: Difficulty was anticipated Future Recommendations: Recommend- induction with short-acting agent, and alternative techniques readily available

## 2017-10-28 NOTE — Anesthesia Preprocedure Evaluation (Addendum)
Anesthesia Evaluation  Patient identified by MRN, date of birth, ID band Patient awake    Reviewed: Allergy & Precautions, H&P , NPO status , Patient's Chart, lab work & pertinent test results  Airway Mallampati: II   Neck ROM: full    Dental  (+) Chipped, Dental Advisory Given,    Pulmonary shortness of breath,    breath sounds clear to auscultation       Cardiovascular negative cardio ROS   Rhythm:regular Rate:Normal     Neuro/Psych    GI/Hepatic   Endo/Other  diabetes, Type obesity  Renal/GU      Musculoskeletal  (+) Arthritis ,   Abdominal   Peds  Hematology  (+) anemia ,   Anesthesia Other Findings   Reproductive/Obstetrics                            Anesthesia Physical Anesthesia Plan  ASA: II  Anesthesia Plan: General   Post-op Pain Management:    Induction: Intravenous  PONV Risk Score and Plan: 3 and Ondansetron, Dexamethasone, Midazolam and Treatment may vary due to age or medical condition  Airway Management Planned: Oral ETT  Additional Equipment:   Intra-op Plan:   Post-operative Plan: Extubation in OR  Informed Consent: I have reviewed the patients History and Physical, chart, labs and discussed the procedure including the risks, benefits and alternatives for the proposed anesthesia with the patient or authorized representative who has indicated his/her understanding and acceptance.     Plan Discussed with: CRNA, Surgeon and Anesthesiologist  Anesthesia Plan Comments:         Anesthesia Quick Evaluation

## 2017-10-29 LAB — CBC
HEMATOCRIT: 32.8 % — AB (ref 36.0–46.0)
Hemoglobin: 10.9 g/dL — ABNORMAL LOW (ref 12.0–15.0)
MCH: 27.1 pg (ref 26.0–34.0)
MCHC: 33.2 g/dL (ref 30.0–36.0)
MCV: 81.6 fL (ref 78.0–100.0)
Platelets: 282 10*3/uL (ref 150–400)
RBC: 4.02 MIL/uL (ref 3.87–5.11)
RDW: 14.3 % (ref 11.5–15.5)
WBC: 8.2 10*3/uL (ref 4.0–10.5)

## 2017-10-29 LAB — GLUCOSE, CAPILLARY
GLUCOSE-CAPILLARY: 285 mg/dL — AB (ref 70–99)
GLUCOSE-CAPILLARY: 358 mg/dL — AB (ref 70–99)
Glucose-Capillary: 288 mg/dL — ABNORMAL HIGH (ref 70–99)
Glucose-Capillary: 373 mg/dL — ABNORMAL HIGH (ref 70–99)

## 2017-10-29 MED ORDER — ENOXAPARIN SODIUM 80 MG/0.8ML ~~LOC~~ SOLN
65.0000 mg | SUBCUTANEOUS | Status: DC
  Administered 2017-10-29 – 2017-10-30 (×2): 65 mg via SUBCUTANEOUS
  Filled 2017-10-29 (×2): qty 0.8

## 2017-10-29 MED ORDER — OXYCODONE-ACETAMINOPHEN 5-325 MG PO TABS
1.0000 | ORAL_TABLET | ORAL | Status: DC | PRN
  Administered 2017-10-29 – 2017-10-31 (×8): 2 via ORAL
  Filled 2017-10-29 (×8): qty 2

## 2017-10-29 NOTE — Progress Notes (Signed)
Moderate amount of serosanguinous drainage noted from posterior neck incision, top gauzes removed surrounding skin cleaned with sterile NS pad , wound covered with new gauzes and 1/2 ABD pad using sterile technique and sterile gloves, patient tolerated well, will continue to monitor.

## 2017-10-29 NOTE — Progress Notes (Addendum)
ANTIBIOTIC CONSULT NOTE   Pharmacy Consult for Vancomycin + Lovenox Indication: neck abscess, VTE prophx  Allergies  Allergen Reactions  . Bee Venom Anaphylaxis  . Latex Rash    Patient Measurements: Height: 5' 5.51" (166.4 cm) Weight: 289 lb (131.1 kg) IBW/kg (Calculated) : 58.18 Adjusted Body Weight:    Vital Signs: Temp: 97.2 F (36.2 C) (07/27 0311) Temp Source: Oral (07/27 0001) BP: 105/68 (07/27 0311) Pulse Rate: 74 (07/27 0311) Intake/Output from previous day: 07/26 0701 - 07/27 0700 In: 3915.9 [P.O.:240; I.V.:2315.9; IV Piggyback:1360] Out: 800 [Urine:800] Intake/Output from this shift: No intake/output data recorded.  Labs: Recent Labs    10/26/17 1636 10/27/17 0432 10/28/17 0430 10/29/17 0300  WBC 8.9 6.3 5.8 8.2  HGB 11.8* 10.9* 11.1* 10.9*  PLT 299 260 253 282  CREATININE 0.81 0.75  --   --    Estimated Creatinine Clearance: 113.5 mL/min (by C-G formula based on SCr of 0.75 mg/dL). No results for input(s): VANCOTROUGH, VANCOPEAK, VANCORANDOM, GENTTROUGH, GENTPEAK, GENTRANDOM, TOBRATROUGH, TOBRAPEAK, TOBRARND, AMIKACINPEAK, AMIKACINTROU, AMIKACIN in the last 72 hours.   Microbiology:   Medical History: Past Medical History:  Diagnosis Date  . Arthritis   . Diabetes mellitus without complication (HCC)   . Gout flare   . Lower extremity edema 11/18/2016  . Osteoarthritis of back   . Palpitations   . Shortness of breath 11/18/2016  . Thyroid disease    left lobect approx 2012--benign per pt   Assessment:  ID: abx for recurrent abscess posterior neck s/p I&D last 7/26. WBC 5.8>8.2, Afebrile. Scr WNL. F/u cultures. Appears to have missed 7/26 PM dose. - h/o Proteus posterior neck abscess 10/18 - CT: Recurrent posterior neck subcutaneous fat phlegmon with early abscess. Surrounding cellulitis.  Zosyn 7/24 x 1 Flagyl 7/24>>7/25 Vanc 7/24>> Rocephin 7/24>>  7/24 blood x 2>>NGTD 7/25: MRSA PCR: negative  Goal of Therapy:  Vancomycin trough  level 10-15 mcg/ml  Plan:  Vanc 1250mg  IV q 12h hrs (dose adjusted 7/25). Level when back to steady state  Ceftriaxone Plan DVT prophx later today Possibly home in AM. Add Lovenox 0.5mg /kg/24h = 65mg /24h to start this PM for VTE prophx.  Yancy Hascall S. Merilynn Finlandobertson, PharmD, BCPS Clinical Staff Pharmacist Pager [phone]  Misty Stanleyobertson, Anjanette Gilkey Stillinger 10/29/2017,8:17 AM

## 2017-10-29 NOTE — Anesthesia Postprocedure Evaluation (Signed)
Anesthesia Post Note  Patient: Jean Berry  Procedure(s) Performed: IRRIGATION AND DEBRIDEMENT OF NECK ABSCESS (N/A Neck)     Patient location during evaluation: PACU Anesthesia Type: General Level of consciousness: awake and alert Pain management: pain level controlled Vital Signs Assessment: post-procedure vital signs reviewed and stable Respiratory status: spontaneous breathing, nonlabored ventilation and respiratory function stable Cardiovascular status: blood pressure returned to baseline and stable Postop Assessment: no apparent nausea or vomiting Anesthetic complications: no    Last Vitals:  Vitals:   10/29/17 0300 10/29/17 0311  BP:  105/68  Pulse: 71 74  Resp:  16  Temp:  (!) 36.2 C  SpO2: 99%     Last Pain:  Vitals:   10/29/17 0702  TempSrc:   PainSc: 8    Pain Goal: Patients Stated Pain Goal: 4 (10/28/17 1930)               Cecile HearingStephen Edward Aylssa Herrig

## 2017-10-29 NOTE — Progress Notes (Signed)
PROGRESS NOTE  Jean Berry ZOX:[id number] DOB: 01-14-1966 DOA: 10/26/2017 PCP: Arnette Felts  HPI/Recap of past 24 hours: Jean Berry is a 52 y.o. female with medical history significant of diabetes mellitus, gout, iron deficiency anemia, obesity, palpitation/tachycardia, who presents with posterior neck pain and abscess.  Patient states that she has history of posterior neck abscess required I&D 01/2017.  She has been having posterior neck pain for about 10 days. She had subjective fevers/chills 3 days ago, but no fever or chills today. She was seen by PA in Dr. Dixon Boos office yesterday and had I&D for neck abscess. Dr. Lindie Spruce spoke to her on the phone yesterday and recommended that she be evaluated in the emergency room. She continues to have pain in the posterior neck, which is constant, 8 out of 10 severity, nonradiating.  10/27/2017: Patient examined at bedside.  Reports pain in back of her neck with purulent drainage from her neck abscess.  10/28/17: In good spirits this morning. Pain is well controlled on current management.  10/29/2017: POD #1 post incision and drainage and debridement of posterior neck abscess.  Lots of pain this morning post dressing changes.  Continue pain management and dressing changes as recommended by general surgery.    Assessment/Plan: Principal Problem:   Cellulitis and abscess of neck Active Problems:   Diabetes mellitus without complication (HCC)   Palpitations   Iron deficiency anemia  POD #1 post incision/drainage and debridement of recurrent posterior neck abscess  Self-reported abscess present since 2018 CT neck with contrast revealed recurrent posterior neck subcutaneous fat phlegmon with early abscess and surrounding cellulitis Blood cultures x2 taken on 10/26/2017 no growth to date Afebrile with no leukocytosis Continue empiric IV antibiotics MRSA screening negative C/w pain management and bowel regimen Repeat CBC in the  morning  Uncontrolled diabetes with hyperglycemia Blood sugar in the 500th on admission A1C 11.3 Continue insulin Continue regular Accu-Cheks Diabetes coordinator following for DM education Repeat BMP in the morning  Morbid obesity BMI is 47 Recommend weight loss outpatient    Code Status: Full code  Family Communication: None at bedside  Disposition Plan: Home possibly tomorrow 10/30/2017 if no events overnight and tolerates dressing changes   Consultants:  General surgery  Procedures:  I&D on 10/28/2017  Antimicrobials:  IV vancomycin and IV ceftriaxone  DVT prophylaxis:  sq lovenox daily   Objective: Vitals:   10/28/17 2010 10/29/17 0001 10/29/17 0300 10/29/17 0311  BP: 121/66 140/78  105/68  Pulse: 79 69 71 74  Resp: 16 18  16   Temp: 98.1 F (36.7 C) 97.8 F (36.6 C)  (!) 97.2 F (36.2 C)  TempSrc: Oral Oral    SpO2: 96% 99% 99%   Weight:      Height:        Intake/Output Summary (Last 24 hours) at 10/29/2017 1025 Last data filed at 10/29/2017 0545 Gross per 24 hour  Intake 3915.9 ml  Output 800 ml  Net 3115.9 ml   Filed Weights   10/26/17 2200 10/28/17 1538  Weight: 131.1 kg (289 lb) 131.1 kg (289 lb)    Exam:  . General: 52 y.o. year-old female morbidly obese.  No acute distress.  Alert and oriented x3. . Cardiovascular: Regular rate and rhythm with no rubs or gallops.  No JVD or thyromegaly noted. Marland Kitchen Respiratory: Clear to auscultation with no wheezes or rales. Good inspiratory effort. . Abdomen: Soft obese nontender nondistended with normal bowel sounds x4 quadrants. . Musculoskeletal: No lower extremity edema. 2/4 pulses  in all 4 extremities. . Skin: Posterior neck abscess status post I&D, dressing appears clean with no drainage. Marland Kitchen. Psychiatry: Mood is appropriate for condition and setting   Data Reviewed: CBC: Recent Labs  Lab 10/26/17 1636 10/27/17 0432 10/28/17 0430 10/29/17 0300  WBC 8.9 6.3 5.8 8.2  NEUTROABS 5.3  --   --    --   HGB 11.8* 10.9* 11.1* 10.9*  HCT 35.9* 33.0* 34.9* 32.8*  MCV 82.2 81.3 83.5 81.6  PLT 299 260 253 282   Basic Metabolic Panel: Recent Labs  Lab 10/26/17 1636 10/27/17 0432  NA 135 140  K 3.8 3.6  CL 99 108  CO2 24 23  GLUCOSE 507* 321*  BUN 8 6  CREATININE 0.81 0.75  CALCIUM 9.1 8.3*   GFR: Estimated Creatinine Clearance: 113.5 mL/min (by C-G formula based on SCr of 0.75 mg/dL). Liver Function Tests: No results for input(s): AST, ALT, ALKPHOS, BILITOT, PROT, ALBUMIN in the last 168 hours. No results for input(s): LIPASE, AMYLASE in the last 168 hours. No results for input(s): AMMONIA in the last 168 hours. Coagulation Profile: Recent Labs  Lab 10/26/17 2158  INR 1.11   Cardiac Enzymes: No results for input(s): CKTOTAL, CKMB, CKMBINDEX, TROPONINI in the last 168 hours. BNP (last 3 results) No results for input(s): PROBNP in the last 8760 hours. HbA1C: Recent Labs    10/28/17 0430  HGBA1C 11.3*   CBG: Recent Labs  Lab 10/28/17 1218 10/28/17 1503 10/28/17 1801 10/28/17 2303 10/29/17 0645  GLUCAP 253* 199* 191* 318* 285*   Lipid Profile: No results for input(s): CHOL, HDL, LDLCALC, TRIG, CHOLHDL, LDLDIRECT in the last 72 hours. Thyroid Function Tests: No results for input(s): TSH, T4TOTAL, FREET4, T3FREE, THYROIDAB in the last 72 hours. Anemia Panel: No results for input(s): VITAMINB12, FOLATE, FERRITIN, TIBC, IRON, RETICCTPCT in the last 72 hours. Urine analysis: No results found for: COLORURINE, APPEARANCEUR, LABSPEC, PHURINE, GLUCOSEU, HGBUR, BILIRUBINUR, KETONESUR, PROTEINUR, UROBILINOGEN, NITRITE, LEUKOCYTESUR Sepsis Labs: @LABRCNTIP (procalcitonin:4,lacticidven:4)  ) Recent Results (from the past 240 hour(s))  Culture, blood (Routine X 2) w Reflex to ID Panel     Status: None (Preliminary result)   Collection Time: 10/26/17  9:50 PM  Result Value Ref Range Status   Specimen Description BLOOD LEFT ANTECUBITAL  Final   Special Requests   Final     BOTTLES DRAWN AEROBIC AND ANAEROBIC Blood Culture adequate volume   Culture   Final    NO GROWTH 2 DAYS Performed at [facility], [address] [place], [place] [address]    Report Status PENDING  Incomplete  Culture, blood (Routine X 2) w Reflex to ID Panel     Status: None (Preliminary result)   Collection Time: 10/26/17  9:50 PM  Result Value Ref Range Status   Specimen Description BLOOD LEFT HAND  Final   Special Requests Blood Culture adequate volume  Final   Culture   Final    NO GROWTH 2 DAYS Performed at [facility], [address] [place], [place] [address]    Report Status PENDING  Incomplete  MRSA PCR Screening     Status: None   Collection Time: 10/27/17  6:20 PM  Result Value Ref Range Status   MRSA by PCR NEGATIVE NEGATIVE Final    Comment:        The GeneXpert MRSA Assay (FDA approved for NASAL specimens only), is one component of a comprehensive MRSA colonization surveillance program. It is not intended to diagnose MRSA  infection nor to guide or monitor treatment for MRSA infections. Performed at [facility], [address], [place], [place] [address]       Studies: No results found.  Scheduled Meds: . enoxaparin (LOVENOX) injection  65 mg Subcutaneous Q24H  . insulin aspart  0-9 Units Subcutaneous TID WC  . insulin glargine  5 Units Subcutaneous QHS  . loratadine  10 mg Oral Daily  . meloxicam  15 mg Oral Daily  . metoprolol tartrate  25 mg Oral BID  . multivitamin with minerals  1 tablet Oral Daily    Continuous Infusions: . sodium chloride 75 mL/hr at 10/27/17 2104  . cefTRIAXone (ROCEPHIN)  IV 2 g (10/28/17 2257)  . lactated ringers 10 mL/hr at 10/28/17 1545  . vancomycin 1,250 mg (10/29/17 0555)     LOS: 1 day     Darlin Drop, MD [facility] Pager [phone]  If 7PM-7AM, please contact night-coverage [email or web address] Password [facility] 10/29/2017, 10:25 AM

## 2017-10-29 NOTE — Plan of Care (Signed)
  Problem: Activity: Goal: Risk for activity intolerance will decrease Outcome: Adequate for Discharge   Problem: Pain Managment: Goal: General experience of comfort will improve Outcome: Adequate for Discharge   Problem: Safety: Goal: Ability to remain free from injury will improve Outcome: Adequate for Discharge   Problem: Clinical Measurements: Goal: Ability to avoid or minimize complications of infection will improve Outcome: Adequate for Discharge   Problem: Skin Integrity: Goal: Skin integrity will improve Outcome: Adequate for Discharge

## 2017-10-29 NOTE — Progress Notes (Signed)
1 Day Post-Op    CC:  Chronic neck abscess  Subjective: We took the dressing down and change the packing this a.m.  She is extremely tender especially on the right side.  The site measured 4 x 2 x 5 cm.  We got a whole open 4 x 4 into the site.  We will start dressing changes twice daily today.  Objective: Vital signs in last 24 hours: Temp:  [97.2 F (36.2 C)-98.2 F (36.8 C)] 97.2 F (36.2 C) (07/27 0311) Pulse Rate:  [69-97] 74 (07/27 0311) Resp:  [11-20] 16 (07/27 0311) BP: (105-140)/(52-78) 105/68 (07/27 0311) SpO2:  [89 %-100 %] 99 % (07/27 0300) Weight:  [131.1 kg (289 lb)] 131.1 kg (289 lb) (07/26 1538) Last BM Date: 10/25/17 240 PO 3500 IV 800 urine recorded Afebrile, VSS Glucose ranging from 300's to 191 A1C 11.3 No wound cultures Blood cultures are pending Intake/Output from previous day: 07/26 0701 - 07/27 0700 In: 3915.9 [P.O.:240; I.V.:2315.9; IV Piggyback:1360] Out: 800 [Urine:800] Intake/Output this shift: No intake/output data recorded.  General appearance: alert, cooperative and no distress Skin: Posterior neck abscess cavity measures 4 cm x 5 cm x 2 cm.  The site is clean but extremely tender.  Were able to repacked with a whole 4 x 4.`  Lab Results:  Recent Labs    10/28/17 0430 10/29/17 0300  WBC 5.8 8.2  HGB 11.1* 10.9*  HCT 34.9* 32.8*  PLT 253 282    BMET Recent Labs    10/26/17 1636 10/27/17 0432  NA 135 140  K 3.8 3.6  CL 99 108  CO2 24 23  GLUCOSE 507* 321*  BUN 8 6  CREATININE 0.81 0.75  CALCIUM 9.1 8.3*   PT/INR Recent Labs    10/26/17 2158  LABPROT 14.2  INR 1.11    No results for input(s): AST, ALT, ALKPHOS, BILITOT, PROT, ALBUMIN in the last 168 hours.   Lipase  No results found for: LIPASE   Medications: . insulin aspart  0-9 Units Subcutaneous TID WC  . insulin glargine  5 Units Subcutaneous QHS  . loratadine  10 mg Oral Daily  . meloxicam  15 mg Oral Daily  . metoprolol tartrate  25 mg Oral BID  .  multivitamin with minerals  1 tablet Oral Daily   . sodium chloride 75 mL/hr at 10/27/17 2104  . cefTRIAXone (ROCEPHIN)  IV 2 g (10/28/17 2257)  . lactated ringers 10 mL/hr at 10/28/17 1545  . vancomycin 1,250 mg (10/29/17 0555)   Anti-infectives (From admission, onward)   Start     Dose/Rate Route Frequency Ordered Stop   10/27/17 1700  vancomycin (VANCOCIN) 1,250 mg in sodium chloride 0.9 % 250 mL IVPB     1,250 mg 166.7 mL/hr over 90 Minutes Intravenous Every 12 hours 10/27/17 1140     10/27/17 0600  vancomycin (VANCOCIN) IVPB 1000 mg/200 mL premix  Status:  Discontinued     1,000 mg 200 mL/hr over 60 Minutes Intravenous Every 8 hours 10/26/17 2214 10/27/17 1140   10/26/17 2200  cefTRIAXone (ROCEPHIN) 2 g in sodium chloride 0.9 % 100 mL IVPB     2 g 200 mL/hr over 30 Minutes Intravenous Every 24 hours 10/26/17 2134     10/26/17 2200  metroNIDAZOLE (FLAGYL) IVPB 500 mg  Status:  Discontinued     500 mg 100 mL/hr over 60 Minutes Intravenous Every 8 hours 10/26/17 2134 10/27/17 1359   10/26/17 2145  vancomycin (VANCOCIN) IVPB 1000 mg/200 mL  premix     1,000 mg 200 mL/hr over 60 Minutes Intravenous  Once 10/26/17 2143 10/26/17 2325   10/26/17 2030  piperacillin-tazobactam (ZOSYN) IVPB 3.375 g     3.375 g 100 mL/hr over 30 Minutes Intravenous  Once 10/26/17 2024 10/26/17 2153      Assessment/Plan Type 2 diabetes - uncontrolled OSA/CPAP Obesity/BMI 47.3 Chronic iron deficiency anemia History of palpitations/tachycardia History of gout  Chronic posterior neck abscess Incision, drainage, and debridement of posterior neck abscess, 10/28/2017, Dr. Glenna FellowsBenjamin Hoxworth  FEN: IV fluids/heart healthy diet ID: Flagyl 7/24-7/25;  Rocephin 7/25 =>> day 3;       vancomycin 7/24=> day 4 DVT:  SCD Follow up:  [facility]  Plan: Start dressing changes today, she lives with a son who will be able to do her dressing changes.  I would recheck her labs tomorrow and if she is tolerating  dressing changes well she could probably go home in AM when she is medically stable.  Will defer medical management to Dr. Margo AyeHall.  We can put her on DVT prophylaxis later today.  LOS: 1 day    Sharlotte Baka 10/29/2017 [phone]

## 2017-10-29 NOTE — Progress Notes (Signed)
Pt refusing CPAP and states she just wants to wear [place] tonight. I told her to call if she changes her mind. CPAP set up at bedside

## 2017-10-29 NOTE — Progress Notes (Signed)
Family member (son) educated on dressing change, watched dressing change today and will demonstrated dressing change tomorrow. Verbalized understanding of dressing change, and listed the steps of completion. Will continue to educate.

## 2017-10-30 ENCOUNTER — Encounter (HOSPITAL_COMMUNITY): Payer: Self-pay | Admitting: General Surgery

## 2017-10-30 LAB — GLUCOSE, CAPILLARY
GLUCOSE-CAPILLARY: 311 mg/dL — AB (ref 70–99)
GLUCOSE-CAPILLARY: 337 mg/dL — AB (ref 70–99)
GLUCOSE-CAPILLARY: 324 mg/dL — AB (ref 70–99)
GLUCOSE-CAPILLARY: 312 mg/dL — AB (ref 70–99)

## 2017-10-30 LAB — CBC
HCT: 30.5 % — ABNORMAL LOW (ref 36.0–46.0)
Hemoglobin: 9.8 g/dL — ABNORMAL LOW (ref 12.0–15.0)
MCH: 26.6 pg (ref 26.0–34.0)
MCHC: 32.1 g/dL (ref 30.0–36.0)
MCV: 82.9 fL (ref 78.0–100.0)
Platelets: 268 10*3/uL (ref 150–400)
RBC: 3.68 MIL/uL — AB (ref 3.87–5.11)
RDW: 14.7 % (ref 11.5–15.5)
WBC: 6.5 10*3/uL (ref 4.0–10.5)

## 2017-10-30 LAB — BASIC METABOLIC PANEL
Anion gap: 8 (ref 5–15)
BUN: 8 mg/dL (ref 6–20)
CALCIUM: 8.1 mg/dL — AB (ref 8.9–10.3)
CO2: 24 mmol/L (ref 22–32)
CREATININE: 0.82 mg/dL (ref 0.44–1.00)
Chloride: 106 mmol/L (ref 98–111)
GFR calc Af Amer: >60 mL/min (ref >60)
GLUCOSE: 343 mg/dL — AB (ref 70–99)
Potassium: 3.5 mmol/L (ref 3.5–5.1)
SODIUM: 138 mmol/L (ref 135–145)

## 2017-10-30 MED ORDER — INSULIN GLARGINE 100 UNIT/ML ~~LOC~~ SOLN
15.0000 [IU] | Freq: Every day | SUBCUTANEOUS | Status: DC
  Administered 2017-10-30 – 2017-10-31 (×2): 15 [IU] via SUBCUTANEOUS
  Filled 2017-10-30 (×2): qty 0.15

## 2017-10-30 MED ORDER — AMOXICILLIN-POT CLAVULANATE 875-125 MG PO TABS
1.0000 | ORAL_TABLET | Freq: Two times a day (BID) | ORAL | Status: DC
  Administered 2017-10-30 – 2017-10-31 (×3): 1 via ORAL
  Filled 2017-10-30 (×3): qty 1

## 2017-10-30 MED ORDER — INSULIN ASPART 100 UNIT/ML ~~LOC~~ SOLN
5.0000 [IU] | Freq: Three times a day (TID) | SUBCUTANEOUS | Status: DC
  Administered 2017-10-30 – 2017-10-31 (×3): 5 [IU] via SUBCUTANEOUS

## 2017-10-30 NOTE — Progress Notes (Signed)
Inpatient Diabetes Program Recommendations  AACE/ADA: New Consensus Statement on Inpatient Glycemic Control (2015)  Target Ranges:  Prepandial:   less than 140 mg/dL      Peak postprandial:   less than 180 mg/dL (1-2 hours)      Critically ill patients:  140 - 180 mg/dL    Consult for insulin and DM education. A1c resulted this admission as 11.3%. Please see note from 7/26. Patient was educated on DM lifestyle management and insulin pen administration, which was similar to the Victoza injections she was giving herself. Glucose trends in the 300's. Patient received Decadron 10 mg on 7/26 at 1700 pm. Noted Lantus increased to 15 units. Will follow trends.  Thanks,  Christena DeemShannon Jaylynn Siefert RN, MSN, BC-ADM Inpatient Diabetes Coordinator Team Pager [phone] (8a-5p)

## 2017-10-30 NOTE — Progress Notes (Addendum)
2 Days Post-Op    CC: Chronic neck abscess  Subjective: Took the dressing out she is tender and sore but the site actually looks quite good.  She is going to get in the shower this morning her son can repack it later today.  They did a good job with packing at last evening.  She is on O2 for her sleep apnea.  Objective: Vital signs in last 24 hours: Temp:  [97.7 F (36.5 C)-98.1 F (36.7 C)] 97.8 F (36.6 C) (07/28 0500) Pulse Rate:  [63-79] 63 (07/28 0500) Resp:  [14-18] 14 (07/28 0500) BP: (106-110)/(49-53) 106/53 (07/28 0500) SpO2:  [96 %-100 %] 100 % (07/28 0500) Last BM Date: 10/29/17 240 PO IV 2600 Urine x2 No BM recorded Afebrile, VSS Glucose is 343, (288-373 range yesterday) CBC is fine  WBC 6.5    Intake/Output from previous day: 07/27 0701 - 07/28 0700 In: 2953.9 [P.O.:240; I.V.:114; IV Piggyback:2599.9] Out: -  Intake/Output this shift: No intake/output data recorded.  General appearance: alert, cooperative and no distress Resp: clear to auscultation bilaterally Skin: Open site posterior neck is clean, good granular tissues.  She does not need any further surgical debridement.  Lab Results:  Recent Labs    10/29/17 0300 10/30/17 0630  WBC 8.2 6.5  HGB 10.9* 9.8*  HCT 32.8* 30.5*  PLT 282 268    BMET Recent Labs    10/30/17 0630  NA 138  K 3.5  CL 106  CO2 24  GLUCOSE 343*  BUN 8  CREATININE 0.82  CALCIUM 8.1*   PT/INR No results for input(s): LABPROT, INR in the last 72 hours.  No results for input(s): AST, ALT, ALKPHOS, BILITOT, PROT, ALBUMIN in the last 168 hours.   Lipase  No results found for: LIPASE   Medications: . enoxaparin (LOVENOX) injection  65 mg Subcutaneous Q24H  . insulin aspart  0-9 Units Subcutaneous TID WC  . insulin glargine  5 Units Subcutaneous QHS  . loratadine  10 mg Oral Daily  . meloxicam  15 mg Oral Daily  . metoprolol tartrate  25 mg Oral BID  . multivitamin with minerals  1 tablet Oral Daily   .  sodium chloride 75 mL/hr at 10/27/17 2104  . cefTRIAXone (ROCEPHIN)  IV 2 g (10/29/17 2136)  . lactated ringers 10 mL/hr at 10/28/17 1545  . vancomycin 1,250 mg (10/30/17 0500)   Anti-infectives (From admission, onward)   Start     Dose/Rate Route Frequency Ordered Stop   10/27/17 1700  vancomycin (VANCOCIN) 1,250 mg in sodium chloride 0.9 % 250 mL IVPB     1,250 mg 166.7 mL/hr over 90 Minutes Intravenous Every 12 hours 10/27/17 1140     10/27/17 0600  vancomycin (VANCOCIN) IVPB 1000 mg/200 mL premix  Status:  Discontinued     1,000 mg 200 mL/hr over 60 Minutes Intravenous Every 8 hours 10/26/17 2214 10/27/17 1140   10/26/17 2200  cefTRIAXone (ROCEPHIN) 2 g in sodium chloride 0.9 % 100 mL IVPB     2 g 200 mL/hr over 30 Minutes Intravenous Every 24 hours 10/26/17 2134     10/26/17 2200  metroNIDAZOLE (FLAGYL) IVPB 500 mg  Status:  Discontinued     500 mg 100 mL/hr over 60 Minutes Intravenous Every 8 hours 10/26/17 2134 10/27/17 1359   10/26/17 2145  vancomycin (VANCOCIN) IVPB 1000 mg/200 mL premix     1,000 mg 200 mL/hr over 60 Minutes Intravenous  Once 10/26/17 2143 10/26/17 2325   10/26/17  2030  piperacillin-tazobactam (ZOSYN) IVPB 3.375 g     3.375 g 100 mL/hr over 30 Minutes Intravenous  Once 10/26/17 2024 10/26/17 2153      Assessment/Plan  Type 2 diabetes - uncontrolled OSA/CPAP Obesity/BMI 47.3 Chronic iron deficiency anemia History of palpitations/tachycardia History of gout  Chronic posterior neck abscess Incision, drainage, and debridement of posterior neck abscess, 10/28/2017, Dr. Sharlet SalinaBenjamin Hoxworth  POD #2  FEN: IV fluids/heart healthy diet ID: Flagyl 7/24-7/25;  Rocephin 7/25 =>> day 4;       vancomycin 7/24=> day 5 DVT:  SCD Follow up:  [facility]  Plan: Switch her over to oral antibiotics.  We do not have a culture, I cover her with broad-spectrum oral antibiotic for staph/strep for the next week.  I will leave follow-up information in the AVS. she can  go home from our standpoint when medically stable.  Glucose is still in the 300 range.  She is also on O2 for her sleep apnea. From our standpoint she can go when medically stable.    LOS: 2 days    Trinton Prewitt 10/30/2017 [phone]

## 2017-10-30 NOTE — Progress Notes (Signed)
Pt refusing CPAP and states she just wants to wear [place] tonight because it is helping her enough and she isnt waking with a headache like from the CPAP. I told her to call if she changes her mind. CPAP set up at bedside

## 2017-10-30 NOTE — Progress Notes (Addendum)
PROGRESS NOTE  Jean Berry ZOX:[id number] DOB: [birth year] DOA: 10/26/2017 PCP: Arnette Felts  HPI/Recap of past 24 hours: Jean Berry is a 52 y.o. female with medical history significant of diabetes mellitus, gout, iron deficiency anemia, obesity, palpitation/tachycardia, who presents with posterior neck pain and abscess.  Patient states that she has history of posterior neck abscess required I&D 01/2017.  She has been having posterior neck pain for about 10 days. She had subjective fevers/chills 3 days ago, but no fever or chills today. She was seen by PA in Dr. Dixon Boos office yesterday and had I&D for neck abscess. Dr. Lindie Spruce spoke to her on the phone yesterday and recommended that she be evaluated in the emergency room. She continues to have pain in the posterior neck, which is constant, 8 out of 10 severity, nonradiating.  10/27/2017: Patient examined at bedside.  Reports pain in back of her neck with purulent drainage from her neck abscess.  10/28/17: In good spirits this morning. Pain is well controlled on current management.  10/29/2017: POD #1 post incision and drainage and debridement of posterior neck abscess.  Lots of pain this morning post dressing changes.  Continue pain management and dressing changes as recommended by general surgery.  10/30/17: Moderate Pain in the neck. Blood sugar uncontrolled. Increased dose lantus and novolog.    Assessment/Plan: Principal Problem:   Cellulitis and abscess of neck Active Problems:   Diabetes mellitus without complication (HCC)   Palpitations   Iron deficiency anemia  POD #2 post incision/drainage and debridement of recurrent posterior neck abscess  Self-reported abscess present since 2018 CT neck with contrast revealed recurrent posterior neck subcutaneous fat phlegmon with early abscess and surrounding cellulitis Blood cultures x2 taken on 10/26/2017 no growth to date Afebrile with no leukocytosis Switch to augmentin BID x7  days MRSA screening negative C/w pain management and bowel regimen Repeat CBC in the morning  Uncontrolled diabetes with hyperglycemia Blood sugar in the 500th on admission A1C 11.3 Start lantus 15U  And novolog 5u TID Continue regular [facility]u-[facility]ks Diabetes coordinator following for DM education Repeat BMP am  Acute blood loss anemia Post I&D Hg 9.6 from 10.8 Baseline hg 11.0 Repeat CBC am  Morbid obesity BMI is 47 Recommend weight loss outpatient    Code Status: Full code  Family Communication: None at bedside  Disposition Plan: Home possibly tomorrow 10/31/2017 if no events overnight and blood sugar better controlled.   Consultants:  General surgery  Procedures:  I&D on 10/28/2017  Antimicrobials:  IV vancomycin and IV ceftriaxone  DVT prophylaxis:  sq lovenox daily   Objective: Vitals:   10/29/17 1947 10/30/17 0500 10/30/17 1010 10/30/17 1509  BP: (!) 106/53 (!) 106/53  (!) 99/49  Pulse: 79 63 60 73  Resp:  14    Temp: 97.7 F (36.5 C) 97.8 F (36.6 C)  (!) 97.4 F (36.3 C)  TempSrc: Oral Oral  Oral  SpO2: 100% 100%  99%  Weight:      Height:       No intake or output data in the 24 hours ending 10/30/17 1551 Filed Weights   10/26/17 2200 10/28/17 1538  Weight: 131.1 kg (289 lb) 131.1 kg (289 lb)    Exam:  . General: 52 y.o. year-old female Morbidly obese. Appears uncomfortable due to moderate pain in posterior neck at site of incision. A&O x 3 . Cardiovascular: RRR no rubs or gallops. No JVD or thyromegaly. Marland Kitchen Respiratory: Clear to auscultation with no wheezes or rales. Good  inspiratory effort. . Abdomen: Soft obese nontender nondistended with normal bowel sounds x4 quadrants. . Musculoskeletal: No lower extremity edema. 2/4 pulses in all 4 extremities. . Skin: Posterior neck abscess status post I&D, dressing appears clean with no drainage. Marland Kitchen Psychiatry: Mood is appropriate for condition and setting   Data Reviewed: CBC: Recent Labs    Lab 10/26/17 1636 10/27/17 0432 10/28/17 0430 10/29/17 0300 10/30/17 0630  WBC 8.9 6.3 5.8 8.2 6.5  NEUTROABS 5.3  --   --   --   --   HGB 11.8* 10.9* 11.1* 10.9* 9.8*  HCT 35.9* 33.0* 34.9* 32.8* 30.5*  MCV 82.2 81.3 83.5 81.6 82.9  PLT 299 260 253 282 268   Basic Metabolic Panel: Recent Labs  Lab 10/26/17 1636 10/27/17 0432 10/30/17 0630  NA 135 140 138  K 3.8 3.6 3.5  CL 99 108 106  CO2 24 23 24   GLUCOSE 507* 321* 343*  BUN 8 6 8   CREATININE 0.81 0.75 0.82  CALCIUM 9.1 8.3* 8.1*   GFR: Estimated Creatinine Clearance: 110.7 mL/min (by C-G formula based on SCr of 0.82 mg/dL). Liver Function Tests: No results for input(s): AST, ALT, ALKPHOS, BILITOT, PROT, ALBUMIN in the last 168 hours. No results for input(s): LIPASE, AMYLASE in the last 168 hours. No results for input(s): AMMONIA in the last 168 hours. Coagulation Profile: Recent Labs  Lab 10/26/17 2158  INR 1.11   Cardiac Enzymes: No results for input(s): CKTOTAL, CKMB, CKMBINDEX, TROPONINI in the last 168 hours. BNP (last 3 results) No results for input(s): PROBNP in the last 8760 hours. HbA1C: Recent Labs    10/28/17 0430  HGBA1C 11.3*   CBG: Recent Labs  Lab 10/29/17 1146 10/29/17 1631 10/29/17 2132 10/30/17 0634 10/30/17 1158  GLUCAP 288* 358* 373* 311* 337*   Lipid Profile: No results for input(s): CHOL, HDL, LDLCALC, TRIG, CHOLHDL, LDLDIRECT in the last 72 hours. Thyroid Function Tests: No results for input(s): TSH, T4TOTAL, FREET4, T3FREE, THYROIDAB in the last 72 hours. Anemia Panel: No results for input(s): VITAMINB12, FOLATE, FERRITIN, TIBC, IRON, RETICCTPCT in the last 72 hours. Urine analysis: No results found for: COLORURINE, APPEARANCEUR, LABSPEC, PHURINE, GLUCOSEU, HGBUR, BILIRUBINUR, KETONESUR, PROTEINUR, UROBILINOGEN, NITRITE, LEUKOCYTESUR Sepsis Labs: @LABRCNTIP (procalcitonin:4,lacticidven:4)  ) Recent Results (from the past 240 hour(s))  Culture, blood (Routine X 2) w  Reflex to ID Panel     Status: None (Preliminary result)   Collection Time: 10/26/17  9:50 PM  Result Value Ref Range Status   Specimen Description BLOOD LEFT ANTECUBITAL  Final   Special Requests   Final    BOTTLES DRAWN AEROBIC AND ANAEROBIC Blood Culture adequate volume   Culture   Final    NO GROWTH 4 DAYS Performed at [facility], [address], [place], [place] [address]    Report Status PENDING  Incomplete  Culture, blood (Routine X 2) w Reflex to ID Panel     Status: None (Preliminary result)   Collection Time: 10/26/17  9:50 PM  Result Value Ref Range Status   Specimen Description BLOOD LEFT HAND  Final   Special Requests Blood Culture adequate volume  Final   Culture   Final    NO GROWTH 4 DAYS Performed at [facility], [address] [place], [place] [address]    Report Status PENDING  Incomplete  MRSA PCR Screening     Status: None   Collection Time: 10/27/17  6:20 PM  Result Value Ref Range Status   MRSA  by PCR NEGATIVE NEGATIVE Final    Comment:        The GeneXpert MRSA Assay (FDA approved for NASAL specimens only), is one component of a comprehensive MRSA colonization surveillance program. It is not intended to diagnose MRSA infection nor to guide or monitor treatment for MRSA infections. Performed at [facility], [address] [place], [place] [address]       Studies: No results found.  Scheduled Meds: . amoxicillin-clavulanate  1 tablet Oral Q12H  . enoxaparin (LOVENOX) injection  65 mg Subcutaneous Q24H  . insulin aspart  0-9 Units Subcutaneous TID WC  . insulin aspart  5 Units Subcutaneous TID WC  . insulin glargine  15 Units Subcutaneous Daily  . loratadine  10 mg Oral Daily  . meloxicam  15 mg Oral Daily  . metoprolol tartrate  25 mg Oral BID  . multivitamin with minerals  1 tablet Oral Daily    Continuous Infusions: . lactated ringers 10 mL/hr at 10/28/17 1545     LOS: 2 days     Darlin Droparole N Jean Rae, MD [facility] Pager [phone]  If 7PM-7AM, please contact night-coverage [email or web address] Password [email or web address] 10/30/2017, 3:51 PM

## 2017-10-31 LAB — CBC
HCT: 32.4 % — ABNORMAL LOW (ref 36.0–46.0)
HEMOGLOBIN: 10.5 g/dL — AB (ref 12.0–15.0)
MCH: 26.9 pg (ref 26.0–34.0)
MCHC: 32.4 g/dL (ref 30.0–36.0)
MCV: 83.1 fL (ref 78.0–100.0)
Platelets: 295 10*3/uL (ref 150–400)
RBC: 3.9 MIL/uL (ref 3.87–5.11)
RDW: 15.3 % (ref 11.5–15.5)
WBC: 5.5 10*3/uL (ref 4.0–10.5)

## 2017-10-31 LAB — CULTURE, BLOOD (ROUTINE X 2)
CULTURE: NO GROWTH
CULTURE: NO GROWTH
SPECIAL REQUESTS: ADEQUATE
Special Requests: ADEQUATE

## 2017-10-31 LAB — GLUCOSE, CAPILLARY
GLUCOSE-CAPILLARY: 236 mg/dL — AB (ref 70–99)
Glucose-Capillary: 362 mg/dL — ABNORMAL HIGH (ref 70–99)

## 2017-10-31 LAB — BASIC METABOLIC PANEL
ANION GAP: 8 (ref 5–15)
BUN: 6 mg/dL (ref 6–20)
CALCIUM: 8.6 mg/dL — AB (ref 8.9–10.3)
CHLORIDE: 105 mmol/L (ref 98–111)
CO2: 27 mmol/L (ref 22–32)
CREATININE: 0.76 mg/dL (ref 0.44–1.00)
GFR calc non Af Amer: >60 mL/min (ref >60)
Glucose, Bld: 268 mg/dL — ABNORMAL HIGH (ref 70–99)
Potassium: 3.7 mmol/L (ref 3.5–5.1)
SODIUM: 140 mmol/L (ref 135–145)

## 2017-10-31 MED ORDER — POLYETHYLENE GLYCOL 3350 17 G PO PACK
17.0000 g | PACK | Freq: Every day | ORAL | Status: DC
  Administered 2017-10-31: 17 g via ORAL

## 2017-10-31 MED ORDER — PANTOPRAZOLE SODIUM 40 MG PO TBEC: 40.0000 mg | DELAYED_RELEASE_TABLET | Freq: Every day | ORAL | 0 refills | Status: AC

## 2017-10-31 MED ORDER — OXYCODONE-ACETAMINOPHEN 5-325 MG PO TABS: 1.0000 | ORAL_TABLET | Freq: Two times a day (BID) | ORAL | 0 refills | Status: DC | PRN

## 2017-10-31 MED ORDER — INSULIN GLARGINE 100 UNIT/ML ~~LOC~~ SOLN: 22.0000 [IU] | Freq: Every day | SUBCUTANEOUS | Status: DC

## 2017-10-31 MED ORDER — BLOOD GLUCOSE MONITOR KIT: PACK | 0 refills | Status: DC

## 2017-10-31 MED ORDER — INSULIN ASPART 100 UNIT/ML ~~LOC~~ SOLN: 10.0000 [IU] | Freq: Three times a day (TID) | SUBCUTANEOUS | Status: DC

## 2017-10-31 MED ORDER — SENNOSIDES-DOCUSATE SODIUM 8.6-50 MG PO TABS: 1.0000 | ORAL_TABLET | Freq: Every evening | ORAL | 0 refills | Status: DC | PRN

## 2017-10-31 MED ORDER — INSULIN GLARGINE 100 UNIT/ML ~~LOC~~ SOLN: 22.0000 [IU] | Freq: Every day | SUBCUTANEOUS | 0 refills | Status: DC

## 2017-10-31 MED ORDER — PANTOPRAZOLE SODIUM 40 MG PO TBEC
40.0000 mg | DELAYED_RELEASE_TABLET | Freq: Every day | ORAL | Status: DC
  Administered 2017-10-31: 40 mg via ORAL
  Filled 2017-10-31: qty 1

## 2017-10-31 MED ORDER — POLYETHYLENE GLYCOL 3350 17 G PO PACK
17.0000 g | PACK | Freq: Every day | ORAL | 0 refills | Status: DC
Start: 2017-10-31 — End: 2017-12-15

## 2017-10-31 MED ORDER — METOPROLOL TARTRATE 25 MG PO TABS: 25.0000 mg | ORAL_TABLET | Freq: Two times a day (BID) | ORAL | 0 refills | Status: AC

## 2017-10-31 MED ORDER — AMOXICILLIN-POT CLAVULANATE 875-125 MG PO TABS: 1.0000 | ORAL_TABLET | Freq: Two times a day (BID) | ORAL | 0 refills | Status: AC

## 2017-10-31 MED ORDER — INSULIN DETEMIR 100 UNIT/ML ~~LOC~~ SOLN: 11.0000 [IU] | Freq: Two times a day (BID) | SUBCUTANEOUS | 11 refills | Status: DC

## 2017-10-31 MED ORDER — INSULIN ASPART 100 UNIT/ML ~~LOC~~ SOLN
7.0000 [IU] | Freq: Three times a day (TID) | SUBCUTANEOUS | Status: DC
  Administered 2017-10-31: 7 [IU] via SUBCUTANEOUS

## 2017-10-31 MED ORDER — INSULIN GLARGINE 100 UNIT/ML ~~LOC~~ SOLN
7.0000 [IU] | Freq: Once | SUBCUTANEOUS | Status: AC
  Administered 2017-10-31: 7 [IU] via SUBCUTANEOUS
  Filled 2017-10-31: qty 0.07

## 2017-10-31 MED ORDER — INSULIN ASPART 100 UNIT/ML ~~LOC~~ SOLN: 10.0000 [IU] | Freq: Three times a day (TID) | SUBCUTANEOUS | 0 refills | Status: DC

## 2017-10-31 NOTE — Progress Notes (Signed)
[facility] Progress Note  3 Days Post-Op  Subjective: CC: tenderness around R side of neck wound Tolerating dressing chnages, still tender on the R side of wound. Son has been coming and doing some dressing changes. Working on glucose control.   Objective: Vital signs in last 24 hours: Temp:  [97.4 F (36.3 C)-98 F (36.7 C)] 97.7 F (36.5 C) (07/29 0515) Pulse Rate:  [60-77] 63 (07/29 0515) Resp:  [14] 14 (07/29 0515) BP: (92-112)/(49-65) 92/52 (07/29 0515) SpO2:  [98 %-100 %] 100 % (07/29 0515) Last BM Date: 10/29/17  Intake/Output from previous day: No intake/output data recorded. Intake/Output this shift: No intake/output data recorded.  PE: Gen:  Alert, NAD, pleasant Neck: Posterior neck wound clean with good granulation tissue, no tracts, minimal drainage on packing Card:  Regular rate and rhythm Pulm:  Normal effort, clear to auscultation bilaterally Abd: Soft, non-tender, non-distended Skin: warm and dry, no rashes  Psych: A&Ox3   Lab Results:  Recent Labs    10/30/17 0630 10/31/17 0603  WBC 6.5 5.5  HGB 9.8* 10.5*  HCT 30.5* 32.4*  PLT 268 295   BMET Recent Labs    10/30/17 0630 10/31/17 0603  NA 138 140  K 3.5 3.7  CL 106 105  CO2 24 27  GLUCOSE 343* 268*  BUN 8 6  CREATININE 0.82 0.76  CALCIUM 8.1* 8.6*   PT/INR No results for input(s): LABPROT, INR in the last 72 hours. CMP     Component Value Date/Time   NA 140 10/31/2017 0603   K 3.7 10/31/2017 0603   CL 105 10/31/2017 0603   CO2 27 10/31/2017 0603   GLUCOSE 268 (H) 10/31/2017 0603   BUN 6 10/31/2017 0603   CREATININE 0.76 10/31/2017 0603   CALCIUM 8.6 (L) 10/31/2017 0603   PROT 6.7 01/07/2017 1825   ALBUMIN 3.2 (L) 01/07/2017 1825   AST 25 01/07/2017 1825   ALT 27 01/07/2017 1825   ALKPHOS 58 01/07/2017 1825   BILITOT 0.5 01/07/2017 1825   GFRNONAA >60 10/31/2017 0603   GFRAA >60 10/31/2017 0603   Lipase  No results found for:  LIPASE     Studies/Results: No results found.  Anti-infectives: Anti-infectives (From admission, onward)   Start     Dose/Rate Route Frequency Ordered Stop   10/30/17 1000  amoxicillin-clavulanate (AUGMENTIN) 875-125 MG per tablet 1 tablet     1 tablet Oral Every 12 hours 10/30/17 0952 11/04/17 0959   10/27/17 1700  vancomycin (VANCOCIN) 1,250 mg in sodium chloride 0.9 % 250 mL IVPB  Status:  Discontinued     1,250 mg 166.7 mL/hr over 90 Minutes Intravenous Every 12 hours 10/27/17 1140 10/30/17 0952   10/27/17 0600  vancomycin (VANCOCIN) IVPB 1000 mg/200 mL premix  Status:  Discontinued     1,000 mg 200 mL/hr over 60 Minutes Intravenous Every 8 hours 10/26/17 2214 10/27/17 1140   10/26/17 2200  cefTRIAXone (ROCEPHIN) 2 g in sodium chloride 0.9 % 100 mL IVPB  Status:  Discontinued     2 g 200 mL/hr over 30 Minutes Intravenous Every 24 hours 10/26/17 2134 10/30/17 0952   10/26/17 2200  metroNIDAZOLE (FLAGYL) IVPB 500 mg  Status:  Discontinued     500 mg 100 mL/hr over 60 Minutes Intravenous Every 8 hours 10/26/17 2134 10/27/17 1359   10/26/17 2145  vancomycin (VANCOCIN) IVPB 1000 mg/200 mL premix     1,000 mg 200 mL/hr over 60 Minutes Intravenous  Once 10/26/17 2143 10/26/17 2325  10/26/17 2030  piperacillin-tazobactam (ZOSYN) IVPB 3.375 g     3.375 g 100 mL/hr over 30 Minutes Intravenous  Once 10/26/17 2024 10/26/17 2153       Assessment/Plan Type 2 diabetes - uncontrolled, A1c 11.3 OSA/CPAP Obesity/BMI 47.3 Chronic iron deficiency anemia History of palpitations/tachycardia History of gout  Chronic/recurrent posterior neck abscess S/p I&D of posterior neck abscess - 10/28/2017 Dr. Johna Sheriff   - transitioned to PO abx yesterday, recommend discharge on 7-10 days PO abx - WBC 5.5, afebrile - continue dressing changes  FEN: IV fluids/heart healthy diet ID: Flagyl 7/24-7/25; vancomycin 7/24>7/28,Rocephin 7/25>7/28; PO augmentin 7/28>> DVT: SCD Follow up: [facility]  clinic  Plan: Follow-up information in the AVS. Clear for discharge from a surgical standpoint    LOS: 3 days    Wells Guiles , [facility] 10/31/2017, 9:24 AM Pager: [phone] Consults: [phone] Mon-Fri 7:00 am-4:30 pm Sat-Sun 7:00 am-11:30 am

## 2017-10-31 NOTE — Discharge Summary (Addendum)
Discharge Summary  Jean Berry OVF:[id number] DOB: 07/18/1965  PCP: Minette Brine  Admit date: 10/26/2017 Discharge date: 10/31/2017  Time spent: 25 minutes  Recommendations for Outpatient Follow-up:  1. Follow-up with your primary care provider for your diabetes 2. Follow-up with general surgery 3. Take your medications as prescribed  Discharge Diagnoses:  Active Hospital Problems   Diagnosis Date Noted  . Cellulitis and abscess of neck 01/08/2017  . Iron deficiency anemia 10/26/2017  . Palpitations 11/15/2016  . Diabetes mellitus without complication [facility])     Resolved Hospital Problems  No resolved problems to display.    Discharge Condition: Stable  Diet recommendation: Heart healthy diabetic diet  Vitals:   10/31/17 0515 10/31/17 1319  BP: (!) 92/52 115/61  Pulse: 63 68  Resp: 14 18  Temp: 97.7 F (36.5 C) 98.1 F (36.7 C)  SpO2: 100% 100%    History of present illness:  Jean Fortsonis a 52 y.o.femalewith medical history significant ofmorbid obesity, type II diabetes mellitus, gout, iron deficiency anemia, who presents with posterior neck pain and recurrent abscess.  Patient states that she has history of posterior neck abscess required I&Dsince 01/2017.She has been having posterior neck pain for about 10 days.Shehadsubjective fevers/chills 3 days ago. She was seen by PA in Dr. Richarda Blade office andhadI&D for neck abscess on 10/25/2017.Dr. Keane Police to her on the phone and recommended that she be evaluated in the emergency room.  General surgery consulted.  POD #3 post incision and drainage and debridement of posterior neck abscess.   Hospital course complicated by uncontrolled type 2 diabetes with hyperglycemia.  Patient previously on metformin twice daily.  Hemoglobin A1c 11.6.  Patient informed that she will need to continue on insulin and follow-up with her PCP posthospitalization within a week.Marland Kitchen    Hospital Course:  Principal Problem:   Cellulitis and abscess of neck Active Problems:   Diabetes mellitus without complication (HCC)   Palpitations   Iron deficiency anemia   POD #3 post incision/drainage and debridement of recurrent posterior neck abscess  Self-reported abscess present since 2018 CT neck with contrast revealed recurrent posterior neck subcutaneous fat phlegmon with early abscess and surrounding cellulitis Blood cultures x2 taken on 10/26/2017 no growth to date Afebrile with no leukocytosis Switch to augmentin BID x7 days.  Day number 2 out of 7 MRSA screening negative C/w pain management and bowel regimen Follow-up with general surgery outpatient  Uncontrolled diabetes with hyperglycemia Blood sugar in the 500th on admission A1C 11.3 Continue Lantus 22 units daily and NovoLog 10 units 3 times daily Continue regular Accu-Cheks Diabetes coordinator following for DM education Discharge home with home health RN to assist in management of diabetes  Acute blood loss anemia, stable Post I&D Hemoglobin is stable at 10.5 from 9.8 Based on hemoglobin 11.0 Follow-up with your PCP outpatient  Morbid obesity BMI is 47 Recommend weight loss outpatient Regular physical activity and healthy dieting      Procedures:  I&D on 10/28/2017  Consultations:  General surgery  Discharge Exam: BP 115/61 (BP Location: Left Arm)   Pulse 68   Temp 98.1 F (36.7 C) (Oral)   Resp 18   Ht 5' 5.51" (1.664 m)   Wt 131.1 kg (289 lb)   SpO2 100%   BMI 47.34 kg/m  . General: 52 y.o. year-old female mobility obese in no acute distress.  Alert and oriented x3. . Cardiovascular: Regular rate and rhythm with no rubs or gallops.  No thyromegaly or JVD noted.   Marland Kitchen  Respiratory: Clear to auscultation with no wheezes or rales. Good inspiratory effort. . Abdomen: Soft nontender nondistended with normal bowel sounds x4 quadrants. . Musculoskeletal: No lower extremity edema. 2/4 pulses in all 4 extremities. . Skin:  Posterior neck abscess status post I&D, dressing appears clean with no drainage. Marland Kitchen Psychiatry: Mood is appropriate for condition and setting  Discharge Instructions You were cared for by a hospitalist during your hospital stay. If you have any questions about your discharge medications or the care you received while you were in the hospital after you are discharged, you can call the unit and asked to speak with the hospitalist on call if the hospitalist that took care of you is not available. Once you are discharged, your primary care physician will handle any further medical issues. Please note that NO REFILLS for any discharge medications will be authorized once you are discharged, as it is imperative that you return to your primary care physician (or establish a relationship with a primary care physician if you do not have one) for your aftercare needs so that they can reassess your need for medications and monitor your lab values.   Allergies as of 10/31/2017      Reactions   Bee Venom Anaphylaxis   Latex Rash      Medication List    STOP taking these medications   aspirin-acetaminophen-caffeine 250-250-65 MG tablet Commonly known as:  EXCEDRIN MIGRAINE   cyclobenzaprine 5 MG tablet Commonly known as:  FLEXERIL   Diclofenac Sodium 2 % Soln Commonly known as:  PENNSAID   meloxicam 15 MG tablet Commonly known as:  MOBIC   metFORMIN 500 MG 24 hr tablet Commonly known as:  GLUCOPHAGE-XR   methocarbamol 500 MG tablet Commonly known as:  ROBAXIN   multivitamin tablet     TAKE these medications   acetaminophen 500 MG tablet Commonly known as:  TYLENOL Take 1,000 mg by mouth every 6 (six) hours as needed for headache.   amoxicillin-clavulanate 875-125 MG tablet Commonly known as:  AUGMENTIN Take 1 tablet by mouth every 12 (twelve) hours for 7 days.   blood glucose meter kit and supplies Kit Dispense based on patient and insurance preference. Use up to four times daily as  directed. (FOR ICD-9 250.00, 250.01).   insulin aspart 100 UNIT/ML injection Commonly known as:  novoLOG Inject 10 Units into the skin 3 (three) times daily with meals.   insulin glargine 100 UNIT/ML injection Commonly known as:  LANTUS Inject 0.22 mLs (22 Units total) into the skin daily. Start taking on:  11/01/2017   loratadine 10 MG tablet Commonly known as:  CLARITIN Take 10 mg by mouth daily.   metoprolol tartrate 25 MG tablet Commonly known as:  LOPRESSOR Take 1 tablet (25 mg total) by mouth 2 (two) times daily.   multivitamin with minerals Tabs tablet Take 1 tablet by mouth daily.   oxyCODONE-acetaminophen 5-325 MG tablet Commonly known as:  PERCOCET/ROXICET Take 1-2 tablets by mouth 2 (two) times daily as needed for moderate pain or severe pain (also before dressing changes).   pantoprazole 40 MG tablet Commonly known as:  PROTONIX Take 1 tablet (40 mg total) by mouth daily.   polyethylene glycol packet Commonly known as:  MIRALAX / GLYCOLAX Take 17 g by mouth daily.   senna-docusate 8.6-50 MG tablet Commonly known as:  Senokot-S Take 1 tablet by mouth at bedtime as needed for mild constipation.      Allergies  Allergen Reactions  . Bee Venom  Anaphylaxis  . Latex Rash   Follow-up Information    [facility]. Go on 11/17/2017.   Specialty:  General Surgery Why:  Appointment scheduled for 1:30 PM. Arrive by 1:15 to check in. Bring photo ID and insurance information.  Contact information: [address] [place] [address] [phone]        Minette Brine. Call in 1 day(s).   Specialty:  General Practice Why:  appt scheduled for Monday, November 07, 2017 at 9:30 am. please call to reschedule if you are unable to make appointment Contact information: [address] [place] [address] [phone]            The results of significant diagnostics from this hospitalization (including imaging, microbiology,  ancillary and laboratory) are listed below for reference.    Significant Diagnostic Studies: Ct Soft Tissue Neck W Contrast  Result Date: 10/26/2017 CLINICAL DATA:  Posterior neck abscess for 2 weeks. EXAM: CT NECK WITH CONTRAST TECHNIQUE: Multidetector CT imaging of the neck was performed using the standard protocol following the bolus administration of intravenous contrast. CONTRAST:  61m OMNIPAQUE IOHEXOL 300 MG/ML  SOLN COMPARISON:  CT neck June 16, 2017 and January 07, 2017. FINDINGS: Large body habitus results in overall noisy image quality. PHARYNX AND LARYNX: Normal.  Widely patent airway. SALIVARY GLANDS: Normal.  Small intraparotid lymph nodes. THYROID: Status post LEFT thyroidectomy.  Normal RIGHT thyroid lobe. LYMPH NODES: No lymphadenopathy by CT size criteria. Greater than expected number small stable cervical lymph nodes. VASCULAR: Normal. LIMITED INTRACRANIAL: Normal. VISUALIZED ORBITS: Normal. MASTOIDS AND VISUALIZED PARANASAL SINUSES: Well-aerated. SKELETON: Mild temporomandibular osteoarthrosis.  Nonacute. UPPER CHEST: Lung apices are clear. No superior mediastinal lymphadenopathy. OTHER: Recurrent 2.7 x 3.2 x 4.1 cm phlegmon midline paraspinal subcutaneous fat at C4 through C6 with central fluid density, surrounding subcutaneous fat stranding. No subcutaneous gas or radiopaque foreign bodies. IMPRESSION: 1. Recurrent posterior neck subcutaneous fat phlegmon with early abscess. Surrounding cellulitis. 2. No acute process in the anterior neck. Electronically Signed   By: CElon AlasM.D.   On: 10/26/2017 20:20   Mr Lumbar Spine Wo Contrast  Result Date: 10/14/2017 CLINICAL DATA:  52year old female with 3 years of lumbar back pain, no known injury. EXAM: MRI LUMBAR SPINE WITHOUT CONTRAST TECHNIQUE: Multiplanar, multisequence MR imaging of the lumbar spine was performed. No intravenous contrast was administered. COMPARISON:  Lumbar MRIs 07/05/2016, 01/26/2016. Upper GI series  07/26/2016. FINDINGS: Segmentation: Normal on the 2018 upper GI series which is the same numbering system used on the prior MRIs. Alignment: Stable since 2017 with mild straightening of lower lumbar lordosis. No spondylolisthesis. Vertebrae: Chronic degenerative endplate marrow signal changes at L4-L5 eccentric to the right. Similar chronic endplate changes at TK09-F81 L1 benign vertebral body hemangioma remain stable. No acute osseous abnormality identified. Intact visible sacrum and SI joints. Conus medullaris and cauda equina: Conus extends to the L1 level. No lower spinal cord or conus signal abnormality. Paraspinal and other soft tissues: Negative. Disc levels: T10-T11: Disc desiccation with left eccentric disc bulge and mild to moderate facet hypertrophy. No spinal stenosis, but up to moderate bilateral T10 foraminal stenosis. T11-T12: Disc desiccation with disc space loss and circumferential disc bulge. Mild facet hypertrophy. Stable narrowing of the ventral CSF space without spinal stenosis. Moderate left and mild right T11 foraminal stenosis are stable. T12-L1:  Negative. L1-L2:  Negative. L2-L3: Mild far lateral disc bulging. Interval regression of the broad-based right foraminal disc extrusion (series 11, image 27),  with continued obscuration of the exiting right L2 nerve, but regressed neural impingement. Mild facet hypertrophy. No new stenosis. L3-L4: Chronic disc desiccation and mild circumferential disc bulge. Borderline to mild facet hypertrophy. No spinal or lateral recess stenosis. Borderline to mild left L3 foraminal stenosis is stable. L4-L5: Chronic disc desiccation and disc space loss with circumferential but mostly foraminal and far lateral disc bulging, endplate spurring. Mild to moderate facet hypertrophy. Trace chronic degenerative facet joint fluid. No spinal or lateral recess stenosis. Stable borderline to mild bilateral L4 foraminal stenosis. L5-S1: Normal disc. Moderate bilateral facet  hypertrophy. No stenosis. IMPRESSION: 1. Regression of the right foraminal disc herniation at L2-L3 since 2018 with at least mildly improved right L2 foraminal patency. 2. Elsewhere stable MRI appearance of the lumbar spine: Chronic disc and endplate degeneration at L3-L4 and L4-L5, fairly advanced at the latter, with superimposed facet hypertrophy but no lumbar spinal stenosis or other convincing neural impingement. 3. Similar lower thoracic spine chronic disc and posterior element degeneration at T10-T11 T11-T12. No lower thoracic spinal stenosis but there is up to moderate lower thoracic foraminal stenosis. Electronically Signed   By: Genevie Ann M.D.   On: 10/14/2017 07:49    Microbiology: Recent Results (from the past 240 hour(s))  Culture, blood (Routine X 2) w Reflex to ID Panel     Status: None   Collection Time: 10/26/17  9:50 PM  Result Value Ref Range Status   Specimen Description BLOOD LEFT ANTECUBITAL  Final   Special Requests   Final    BOTTLES DRAWN AEROBIC AND ANAEROBIC Blood Culture adequate volume   Culture   Final    NO GROWTH 5 DAYS Performed at [facility], [address], [place], [place] [address]    Report Status 10/31/2017 FINAL  Final  Culture, blood (Routine X 2) w Reflex to ID Panel     Status: None   Collection Time: 10/26/17  9:50 PM  Result Value Ref Range Status   Specimen Description BLOOD LEFT HAND  Final   Special Requests Blood Culture adequate volume  Final   Culture   Final    NO GROWTH 5 DAYS Performed at [facility], [place] [address], [place], [place] [address]    Report Status 10/31/2017 FINAL  Final  MRSA PCR Screening     Status: None   Collection Time: 10/27/17  6:20 PM  Result Value Ref Range Status   MRSA by PCR NEGATIVE NEGATIVE Final    Comment:        The GeneXpert MRSA Assay (FDA approved for NASAL specimens only), is one component of a comprehensive MRSA colonization surveillance program. It is not intended to diagnose  MRSA infection nor to guide or monitor treatment for MRSA infections. Performed at [facility], [place] [address], [place], [place] [address]      Labs: Basic Metabolic Panel: Recent Labs  Lab 10/26/17 1636 10/27/17 0432 10/30/17 0630 10/31/17 0603  NA 135 140 138 140  K 3.8 3.6 3.5 3.7  CL 99 108 106 105  CO2 _0 GLUCOSE 507* 321* 343* 268*  BUN _1 CREATININE 0.81 0.75 0.82 0.76  CALCIUM 9.1 8.3* 8.1* 8.6*   Liver Function Tests: No results for input(s): AST, ALT, ALKPHOS, BILITOT, PROT, ALBUMIN in the last 168 hours. No results for input(s): LIPASE, AMYLASE in the last 168 hours. No results for input(s): AMMONIA in the last 168 hours. CBC:  Recent Labs  Lab 10/26/17 1636 10/27/17 0432 10/28/17 0430 10/29/17 0300 10/30/17 0630 10/31/17 0603  WBC 8.9 6.3 5.8 8.2 6.5 5.5  NEUTROABS 5.3  --   --   --   --   --   HGB 11.8* 10.9* 11.1* 10.9* 9.8* 10.5*  HCT 35.9* 33.0* 34.9* 32.8* 30.5* 32.4*  MCV 82.2 81.3 83.5 81.6 82.9 83.1  PLT 299 260 253 282 268 295   Cardiac Enzymes: No results for input(s): CKTOTAL, CKMB, CKMBINDEX, TROPONINI in the last 168 hours. BNP: BNP (last 3 results) Recent Labs    10/26/17 2158  BNP 11.8    ProBNP (last 3 results) No results for input(s): PROBNP in the last 8760 hours.  CBG: Recent Labs  Lab 10/30/17 1158 10/30/17 1639 10/30/17 2122 10/31/17 0604 10/31/17 1136  GLUCAP 337* 324* 312* 236* 362*       Signed:  Kayleen Memos, MD [facility] 10/31/2017, 3:45 PM

## 2017-10-31 NOTE — Progress Notes (Signed)
Inpatient Diabetes Program Recommendations  AACE/ADA: New Consensus Statement on Inpatient Glycemic Control (2015)  Target Ranges:  Prepandial:   less than 140 mg/dL      Peak postprandial:   less than 180 mg/dL (1-2 hours)      Critically ill patients:  140 - 180 mg/dL   Results for Jean Berry, Mirren (MRN [id number]) as of 10/31/2017 09:46  Ref. Range 10/30/2017 06:34 10/30/2017 11:58 10/30/2017 16:39 10/30/2017 21:22 10/31/2017 06:04  Glucose-Capillary Latest Ref Range: 70 - 99 mg/dL 409311 (H) 811337 (H) 914324 (H) 312 (H) 236 (H)   Review of Glycemic Control  Diabetes history: DM 2 Outpatient Diabetes medications: Metformin 500 mg 4x Daily Current orders for Inpatient glycemic control: Lantus 15 units Daily, Novolog 0-9 units tid, Novolog 5 units meal coverage tid  Inpatient Diabetes Program Recommendations:    Fasting glucose still elevated on Lantus increase. Consider increasing Lantus to 22 units. Give additional 7 units this am.   Thanks,  Christena DeemShannon Lilliam Chamblee RN, MSN, BC-ADM Inpatient Diabetes Coordinator Team Pager [phone] (8a-5p)

## 2017-10-31 NOTE — Plan of Care (Signed)
  Problem: Activity: Goal: Risk for activity intolerance will decrease Outcome: Adequate for Discharge   Problem: Coping: Goal: Level of anxiety will decrease Outcome: Adequate for Discharge   Problem: Pain Managment: Goal: General experience of comfort will improve Outcome: Adequate for Discharge   Problem: Safety: Goal: Ability to remain free from injury will improve Outcome: Adequate for Discharge   Problem: Skin Integrity: Goal: Risk for impaired skin integrity will decrease Outcome: Adequate for Discharge

## 2017-10-31 NOTE — Care Management Note (Addendum)
Case Management Note  Patient Details  Name: Jean HeightLetitia Berry MRN: [id number] Date of Birth: [birth year]  Subjective/Objective:   I&D neck abscess, uncontrolled DM                 Action/Plan: NCM spoke to pt and offered choice for HH/list provided. [facility] Pt states she works Mon-Fri from 8-5 pm. She plans to return to work on next week. Contacted attending for note for work. Arranged follow up appt with her PCP for 8/6 at 9:30 am. Faxed dc summary  Explained PCP or surgeon can complete her FMLA paperwork.     Expected Discharge Date:  10/31/17               Expected Discharge Plan:  Home w Home Health Services  In-House Referral:  NA  Discharge planning Services  CM Consult  Post Acute Care Choice:  Home Health Choice offered to:  Patient  DME Arranged:  N/A DME Agency:  NA  HH Arranged:  RN [facility]  Status of Service:  Completed, signed off  If discussed at Long Length of Stay Meetings, dates discussed:    Additional Comments:  Elliot CousinShavis, Quinten Allerton Ellen, RN 10/31/2017, 2:02 PM

## 2017-10-31 NOTE — Discharge Instructions (Signed)
Cellulitis, Adult Cellulitis is a skin infection. The infected area is usually red and sore. This condition occurs most often in the arms and lower legs. It is very important to get treated for this condition. Follow these instructions at home:  Take over-the-counter and prescription medicines only as told by your doctor.  If you were prescribed an antibiotic medicine, take it as told by your doctor. Do not stop taking the antibiotic even if you start to feel better.  Drink enough fluid to keep your pee (urine) clear or pale yellow.  Do not touch or rub the infected area.  Raise (elevate) the infected area above the level of your heart while you are sitting or lying down.  Place warm or cold wet cloths (warm or cold compresses) on the infected area. Do this as told by your doctor.  Keep all follow-up visits as told by your doctor. This is important. These visits let your doctor make sure your infection is not getting worse. Contact a doctor if:  You have a fever.  Your symptoms do not get better after 1-2 days of treatment.  Your bone or joint under the infected area starts to hurt after the skin has healed.  Your infection comes back. This can happen in the same area or another area.  You have a swollen bump in the infected area.  You have new symptoms.  You feel ill and also have muscle aches and pains. Get help right away if:  Your symptoms get worse.  You feel very sleepy.  You throw up (vomit) or have watery poop (diarrhea) for a long time.  There are red streaks coming from the infected area.  Your red area gets larger.  Your red area turns darker. This information is not intended to replace advice given to you by your health care provider. Make sure you discuss any questions you have with your health care provider. Document Released: 09/08/2007 Document Revised: 08/28/2015 Document Reviewed: 01/29/2015 Elsevier Interactive Patient Education  2018 [employer]   Hyperglycemia Hyperglycemia is when the sugar (glucose) level in your blood is too high. It may not cause symptoms. If you do have symptoms, they may include warning signs, such as:  Feeling more thirsty than normal.  Hunger.  Feeling tired.  Needing to pee (urinate) more than normal.  Blurry eyesight (vision).  You may get other symptoms as it gets worse, such as:  Dry mouth.  Not being hungry (loss of appetite).  Fruity-smelling breath.  Weakness.  Weight gain or loss that is not planned. Weight loss may be fast.  A tingling or numb feeling in your hands or feet.  Headache.  Skin that does not bounce back quickly when it is lightly pinched and released (poor skin turgor).  Pain in your belly (abdomen).  Cuts or bruises that heal slowly.  High blood sugar can happen to people who do or do not have diabetes. High blood sugar can happen slowly or quickly, and it can be an emergency. Follow these instructions at home: General instructions  Take over-the-counter and prescription medicines only as told by your doctor.  Do not use products that contain nicotine or tobacco, such as cigarettes and e-cigarettes. If you need help quitting, ask your doctor.  Limit alcohol intake to no more than 1 drink per day for nonpregnant women and 2 drinks per day for men. One drink equals 12 oz of beer, 5 oz of wine, or 1 oz of hard liquor.  Manage stress. If you need help with this, ask your doctor.  Keep all follow-up visits as told by your doctor. This is important. Eating and drinking  Stay at a healthy weight.  Exercise regularly, as told by your doctor.  Drink enough fluid, especially when you: ? Exercise. ? Get sick. ? Are in hot temperatures.  Eat healthy foods, such as: ? Low-fat (lean) proteins. ? Complex carbs (complex carbohydrates), such as whole wheat bread or brown rice. ? Fresh fruits and vegetables. ? Low-fat dairy products. ? Healthy  fats.  Drink enough fluid to keep your pee (urine) clear or pale yellow. If you have diabetes:  Make sure you know the symptoms of hyperglycemia.  Follow your diabetes management plan, as told by your doctor. Make sure you: ? Take insulin and medicines as told. ? Follow your exercise plan. ? Follow your meal plan. Eat on time. Do not skip meals. ? Check your blood sugar as often as told. Make sure to check before and after exercise. If you exercise longer or in a different way than you normally do, check your blood sugar more often. ? Follow your sick day plan whenever you cannot eat or drink normally. Make this plan ahead of time with your doctor.  Share your diabetes management plan with people in your workplace, school, and household.  Check your urine for ketones when you are ill and as told by your doctor.  Carry a card or wear jewelry that says that you have diabetes. Contact a doctor if:  Your blood sugar level is higher than 240 mg/dL (81.1 mmol/L) for 2 days in a row.  You have problems keeping your blood sugar in your target range.  High blood sugar happens often for you. Get help right away if:  You have trouble breathing.  You have a change in how you think, feel, or act (mental status).  You feel sick to your stomach (nauseous), and that feeling does not go away.  You cannot stop throwing up (vomiting). These symptoms may be an emergency. Do not wait to see if the symptoms will go away. Get medical help right away. Call your local emergency services (911 in the [place] Do not drive yourself to the hospital. Summary  Hyperglycemia is when the sugar (glucose) level in your blood is too high.  High blood sugar can happen to people who do or do not have diabetes.  Make sure you drink enough fluids, eat healthy foods, and exercise regularly.  Contact your doctor if you have problems keeping your blood sugar in your target range. This information is not intended to  replace advice given to you by your health care provider. Make sure you discuss any questions you have with your health care provider. Document Released: 01/17/2009 Document Revised: 12/08/2015 Document Reviewed: 12/08/2015 Elsevier Interactive Patient Education  2017 [employer] Inc.   Incision and Drainage, Care After Refer to this sheet in the next few weeks. These instructions provide you with information about caring for yourself after your procedure. Your health care provider may also give you more specific instructions. Your treatment has been planned according to current medical practices, but problems sometimes occur. Call your health care provider if you have any problems or questions after your procedure. What can I expect after the procedure? After the procedure, it is common to have:  Pain or discomfort around your incision site.  Drainage from your incision.  Follow these instructions at home:  Take over-the-counter and prescription medicines  only as told by your health care provider.  If you were prescribed an antibiotic medicine, take it as told by your health care provider.Do not stop taking the antibiotic even if you start to feel better.  Followinstructions from your health care provider about: ? How to take care of your incision. ? When and how you should change your packing and bandage (dressing). Wash your hands with soap and water before you change your dressing. If soap and water are not available, use hand sanitizer. ? When you should remove your dressing.  Do not take baths, swim, or use a hot tub until your health care provider approves.  Keep all follow-up visits as told by your health care provider. This is important.  Check your incision area every day for signs of infection. Check for: ? More redness, swelling, or pain. ? More fluid or blood. ? Warmth. ? Pus or a bad smell. Contact a health care provider if:  Your cyst or abscess returns.  You have  a fever.  You have more redness, swelling, or pain around your incision.  You have more fluid or blood coming from your incision.  Your incision feels warm to the touch.  You have pus or a bad smell coming from your incision. Get help right away if:  You have severe pain or bleeding.  You cannot eat or drink without vomiting.  You have decreased urine output.  You become short of breath.  You have chest pain.  You cough up blood.  The area where the incision and drainage occurred becomes numb or it tingles. This information is not intended to replace advice given to you by your health care provider. Make sure you discuss any questions you have with your health care provider. Document Released: 06/14/2011 Document Revised: 08/22/2015 Document Reviewed: 01/10/2015 Elsevier Interactive Patient Education  2018 [employer].   Skin Abscess A skin abscess is an infected area on or under your skin that contains pus and other material. An abscess can happen almost anywhere on your body. Some abscesses break open (rupture) on their own. Most continue to get worse unless they are treated. The infection can spread deeper into the body and into your blood, which can make you feel sick. Treatment usually involves draining the abscess. Follow these instructions at home: Abscess Care  If you have an abscess that has not drained, place a warm, clean, wet washcloth over the abscess several times a day. Do this as told by your doctor.  Follow instructions from your doctor about how to take care of your abscess. Make sure you: ? Cover the abscess with a bandage (dressing). ? Change your bandage or gauze as told by your doctor. ? Wash your hands with soap and water before you change the bandage or gauze. If you cannot use soap and water, use hand sanitizer.  Check your abscess every day for signs that the infection is getting worse. Check for: ? More redness, swelling, or pain. ? More fluid  or blood. ? Warmth. ? More pus or a bad smell. Medicines   Take over-the-counter and prescription medicines only as told by your doctor.  If you were prescribed an antibiotic medicine, take it as told by your doctor. Do not stop taking the antibiotic even if you start to feel better. General instructions  To avoid spreading the infection: ? Do not share personal care items, towels, or hot tubs with others. ? Avoid making skin-to-skin contact with other people.  Keep  all follow-up visits as told by your doctor. This is important. Contact a doctor if:  You have more redness, swelling, or pain around your abscess.  You have more fluid or blood coming from your abscess.  Your abscess feels warm when you touch it.  You have more pus or a bad smell coming from your abscess.  You have a fever.  Your muscles ache.  You have chills.  You feel sick. Get help right away if:  You have very bad (severe) pain.  You see red streaks on your skin spreading away from the abscess. This information is not intended to replace advice given to you by your health care provider. Make sure you discuss any questions you have with your health care provider. Document Released: 09/08/2007 Document Revised: 11/16/2015 Document Reviewed: 01/29/2015 Elsevier Interactive Patient Education  2018 Else[employer] Inc. Mechanical Wound Debridement You can shower, and remove the dressing.  You can get soap and water in the open site.  After the shower packed wet-to-dry with an open 4 x 4 wet with sterile saline.  Try to do the dressing changes twice a day if possible.  If you have any issues call our office.  Mechanical wound debridement is a treatment to remove dead tissue from a wound. This helps the wound heal. The treatment involves cleaning the wound (irrigation) and using a pad or gauze (dressing) to remove dead tissue and debris from the wound. There are different types of mechanical wound  debridement. Depending on the wound, you may need to repeat this procedure or change to another form of debridement as your wound starts to heal. Tell a health care provider about:  Any allergies you have.  All medicines you are taking, including vitamins, herbs, eye drops, creams, and over-the-counter medicines.  Any blood disorders you have.  Any medical conditions you have, including any conditions that: ? Cause a significant decrease in blood circulation to the part of the body where the wound is, such as peripheral vascular disease. ? Compromise your defense (immune) system or white blood count.  Any surgeries you have had.  Whether you are pregnant or may be pregnant. What are the risks? Generally, this is a safe procedure. However, problems may occur, including:  Infection.  Bleeding.  Damage to healthy tissue in and around your wound.  Soreness or pain.  Failure of the wound to heal.  Scarring.  What happens before the procedure? You may be given antibiotic medicine to help prevent infection. What happens during the procedure?  Your health care provider may apply a numbing medicine (topical anesthetic) to the wound.  Your health care provider will irrigate your wound with a germ-free (sterile), salt-water (saline) solution. This removes debris, bacteria, and dead tissue.  Depending on what type of mechanical wound debridement you are having, your health care provider may do one of the following: ? Put a dressing on your wound. You may have dry gauze pad placed into the wound. Your health care provider will remove the gauze after the wound is dry. Any dead tissue and debris that has dried into the gauze will be lifted out of the wound (wet-to-dry debridement). ? Use a type of pad (monofilament fiber debridement pad). This pad has a fluffy surface on one side that picks up dead tissue and debris from your wound. Your health care provider wets the pad and wipes it over  your wound for several minutes. ? Irrigate your wound with a pressurized stream of solution such  as saline or water.  Once your health care provider is finished, he or she may apply a light dressing to your wound. The procedure may vary among health care providers and hospitals. What happens after the procedure?  You may receive medicine for pain.  You will continue to receive antibiotic medicine if it was started before your procedure. This information is not intended to replace advice given to you by your health care provider. Make sure you discuss any questions you have with your health care provider. Document Released: 12/11/2014 Document Revised: 08/28/2015 Document Reviewed: 07/31/2014 Elsevier Interactive Patient Education  [employer].

## 2017-11-01 ENCOUNTER — Other Ambulatory Visit: Payer: Self-pay

## 2017-11-03 ENCOUNTER — Encounter: Payer: Self-pay | Admitting: Family Medicine

## 2017-11-03 ENCOUNTER — Ambulatory Visit (INDEPENDENT_AMBULATORY_CARE_PROVIDER_SITE_OTHER): Payer: 59 | Admitting: Family Medicine

## 2017-11-03 ENCOUNTER — Ambulatory Visit: Payer: 59 | Admitting: Nurse Practitioner

## 2017-11-03 ENCOUNTER — Telehealth: Payer: Self-pay | Admitting: Family Medicine

## 2017-11-03 VITALS — BP 124/78 | HR 77 | Temp 98.1°F | Ht 65.51 in | Wt 300.4 lb

## 2017-11-03 DIAGNOSIS — L03221 Cellulitis of neck: Secondary | ICD-10-CM

## 2017-11-03 DIAGNOSIS — L0211 Cutaneous abscess of neck: Secondary | ICD-10-CM

## 2017-11-03 DIAGNOSIS — E119 Type 2 diabetes mellitus without complications: Secondary | ICD-10-CM

## 2017-11-03 DIAGNOSIS — R600 Localized edema: Secondary | ICD-10-CM | POA: Diagnosis not present

## 2017-11-03 DIAGNOSIS — I10 Essential (primary) hypertension: Secondary | ICD-10-CM

## 2017-11-03 MED ORDER — INSULIN DETEMIR 100 UNIT/ML ~~LOC~~ SOLN: 15.0000 [IU] | Freq: Two times a day (BID) | SUBCUTANEOUS | 11 refills | Status: DC

## 2017-11-03 MED ORDER — INSULIN LISPRO 100 UNIT/ML ~~LOC~~ SOLN: SUBCUTANEOUS | 11 refills | Status: DC

## 2017-11-03 MED ORDER — GLUCOSE BLOOD VI STRP: ORAL_STRIP | 12 refills | Status: DC

## 2017-11-03 MED ORDER — INSULIN LISPRO 100 UNIT/ML (KWIKPEN): PEN_INJECTOR | SUBCUTANEOUS | 11 refills | Status: DC

## 2017-11-03 MED ORDER — PEN NEEDLES 31G X 6 MM MISC: 11 refills | Status: AC

## 2017-11-03 NOTE — Progress Notes (Signed)
Jean Berry - 52 y.o. female MRN [id number]  Date of birth: [birth year]  Subjective Chief Complaint  Patient presents with  . Establish Care    here to est care, just released from hospital and her A1C is also really high, Feet and legs feel very tight and swollen.    HPI Jean Berry is a 52 y.o. female with history of T2DM, gout, Goiter s/p L thyroid lobectomy, and OA here today to establish care with new PCP.  She was recently hospitalized for abscess on posterior neck, requiring surgical debridement.  She has done fairly well since procedure and continues on augmentin.  She has f/u with her surgeon in about a week.    T2DM:  While in the hospital her A1c was found to be quite high @ 11.3%.  Levemir and novolog were added and her metformin was discontinued.  Her insurance would only cover levemir and she was told that novolog was not covered.  She is currently doing 11 units BID of levemir.  Recent glucose readings at home have been around 185-195, however was 290 this morning.  Her activity has been limited.   HTN:  Current treatment with metoprolol.  Doing well with this and denies side effects.  Does not monitor BP at home.  She has noticed some swelling in her legs and ankles and is trying compression hose.  She denies shortness of breath or cough associated with this.  ROS:  A comprehensive ROS was completed and negative except as noted per HPI     Allergies  Allergen Reactions  . Bee Venom Anaphylaxis  . Latex Rash    Past Medical History:  Diagnosis Date  . Arthritis   . Diabetes mellitus without complication (HCC)   . Gout flare   . Lower extremity edema 11/18/2016  . Osteoarthritis of back   . Palpitations   . Shortness of breath 11/18/2016  . Thyroid disease    left lobect approx 2012--benign per pt    Past Surgical History:  Procedure Laterality Date  . COLONOSCOPY    . GALLBLADDER SURGERY    . IRRIGATION AND DEBRIDEMENT ABSCESS N/A 01/08/2017   Procedure:  IRRIGATION AND DEBRIDEMENT ABSCESS POSTERIOR NECK;  Surgeon: Andria Meuse, MD;  Location: [facility] OR;  Service: General;  Laterality: N/A;  . IRRIGATION AND DEBRIDEMENT ABSCESS N/A 10/28/2017   Procedure: IRRIGATION AND DEBRIDEMENT OF NECK ABSCESS;  Surgeon: Glenna Fellows, MD;  Location: [facility] OR;  Service: General;  Laterality: N/A;  . OVARIAN CYST SURGERY Right   . THYROID SURGERY Left   . TONSILECTOMY, ADENOIDECTOMY, BILATERAL MYRINGOTOMY AND TUBES    . TUBAL LIGATION    . UPPER GASTROINTESTINAL ENDOSCOPY      Social History   Socioeconomic History  . Marital status: Single    Spouse name: Not on file  . Number of children: Not on file  . Years of education: Not on file  . Highest education level: Not on file  Occupational History  . Not on file  Social Needs  . Financial resource strain: Not on file  . Food insecurity:    Worry: Not on file    Inability: Not on file  . Transportation needs:    Medical: Not on file    Non-medical: Not on file  Tobacco Use  . Smoking status: Never Smoker  . Smokeless tobacco: Never Used  Substance and Sexual Activity  . Alcohol use: No  . Drug use: No  . Sexual activity: Not on file  Lifestyle  . Physical activity:    Days per week: Not on file    Minutes per session: Not on file  . Stress: Not on file  Relationships  . Social connections:    Talks on phone: Not on file    Gets together: Not on file    Attends religious service: Not on file    Active member of club or organization: Not on file    Attends meetings of clubs or organizations: Not on file    Relationship status: Not on file  Other Topics Concern  . Not on file  Social History Narrative  . Not on file    Family History  Problem Relation Age of Onset  . Lung cancer Mother   . Heart disease Mother   . Diabetes Maternal Grandmother   . Heart attack Maternal Grandmother     Health Maintenance  Topic Date Due  . PNEUMOCOCCAL POLYSACCHARIDE VACCINE (1)  09/13/1967  . OPHTHALMOLOGY EXAM  09/13/1975  . URINE MICROALBUMIN  09/13/1975  . TETANUS/TDAP  09/12/1984  . PAP SMEAR  09/13/1986  . MAMMOGRAM  09/13/2015  . COLONOSCOPY  09/13/2015  . FOOT EXAM  12/18/2016  . INFLUENZA VACCINE  11/03/2017  . HEMOGLOBIN A1C  04/30/2018  . HIV Screening  Completed    ----------------------------------------------------------------------------------------------------------------------------------------------------------------------------------------------------------------- Physical Exam BP 124/78 (BP Location: Left Arm, Patient Position: Sitting, Cuff Size: Large)   Pulse 77   Temp 98.1 F (36.7 C) (Oral)   Ht 5' 5.51" (1.664 m)   Wt (!) 300 lb 6.4 oz (136.3 kg)   SpO2 98%   BMI 49.21 kg/m   Physical Exam  Constitutional: She is oriented to person, place, and time. She appears well-nourished. No distress.  HENT:  Head: Normocephalic and atraumatic.  Mouth/Throat: Oropharynx is clear and moist.  Eyes: No scleral icterus.  Neck: No thyromegaly present.  Dressing on posterior neck clean without drainage.  Mild tenderness.   Cardiovascular: Normal rate, regular rhythm, normal heart sounds and intact distal pulses.  Pulmonary/Chest: Effort normal and breath sounds normal.  Musculoskeletal: She exhibits edema (trace).  Lymphadenopathy:    She has no cervical adenopathy.  Neurological: She is alert and oriented to person, place, and time. No cranial nerve deficit.  Skin: Skin is warm and dry. No rash noted.  Psychiatric: She has a normal mood and affect. Her behavior is normal.    ------------------------------------------------------------------------------------------------------------------------------------------------------------------------------------------------------------------- Assessment and Plan  Essential hypertension Bp well controlled at this time Continue metoprolol Can consider addition of low dose ACE-I at f/u appt as  well.   Type 2 diabetes mellitus (HCC) Uncontrolled Continue titration of levemir to 15 units BID Will add humalog sliding scale, reviewed sliding scale with her.  Check glucose qid F/u 6 weeks.   Cellulitis and abscess of neck S/p Surgical debridement -Complete course of antibiotics -Has f/u with Gen surgery.   Lower extremity edema -Mild edema likely 2/2 to venous insufficiency -Discussed weight loss and use of compression stockings.

## 2017-11-03 NOTE — Assessment & Plan Note (Signed)
-  Mild edema likely 2/2 to venous insufficiency -Discussed weight loss and use of compression stockings.

## 2017-11-03 NOTE — Addendum Note (Signed)
Addended by: Mammie LorenzoMATTHEWS, Joey Hudock E on: 11/03/2017 01:14 PM   Modules accepted: Orders

## 2017-11-03 NOTE — Telephone Encounter (Signed)
Humalog kwikpen and pen needles sent in.

## 2017-11-03 NOTE — Patient Instructions (Addendum)
Increase levemir to 15 units twice per day  Humalog Blood Glucose Guidelines 70 - 139  0 units 140 - 180 4 units  181 - 240  6 units  241 - 300  8 units subcut 301 - 350 10 units subcut 351 - 400 12 units subcut Greater than 400 Administer 14 units  notify provider, and repeat POC blood sugar check in 30 minutes. Continue to repeat 10 units subcut and POC blood sugar checks every 30 minutes until blood glucose is less than 300 mg/dL, then resume normal POC blood sugar check and insulin

## 2017-11-03 NOTE — Telephone Encounter (Signed)
For you -

## 2017-11-03 NOTE — Assessment & Plan Note (Signed)
S/p Surgical debridement -Complete course of antibiotics -Has f/u with Gen surgery.

## 2017-11-03 NOTE — Telephone Encounter (Signed)
Will route to office for further disposition; last seen by Everrett Coombeody Matthews.

## 2017-11-03 NOTE — Assessment & Plan Note (Signed)
Bp well controlled at this time Continue metoprolol Can consider addition of low dose ACE-I at f/u appt as well.

## 2017-11-03 NOTE — Telephone Encounter (Signed)
Copied from CRM [phone]#[id number]. Topic: Quick Communication - See Telephone Encounter >> Nov 03, 2017 10:11 AM Tamela OddiMartin, [place], NT wrote: CRM for notification. See Telephone encounter for: 11/03/17. [facility] states that a Rx was sent for insulin lispro (HUMALOG) 100 UNIT/ML injection. Her insurance will only cover the quick pen. If this is done she will need the quick pen needles as well.  Can a new Rx be sent for the quick pen and needles or if the provider wants her to have the vials then a prior auth is needed.   [facility] [place] [address] [phone] (Phone) [phone] (Fax)

## 2017-11-03 NOTE — Assessment & Plan Note (Signed)
Uncontrolled Continue titration of levemir to 15 units BID Will add humalog sliding scale, reviewed sliding scale with her.  Check glucose qid F/u 6 weeks.

## 2017-11-04 ENCOUNTER — Ambulatory Visit: Payer: 59 | Admitting: Family Medicine

## 2017-12-15 ENCOUNTER — Ambulatory Visit (INDEPENDENT_AMBULATORY_CARE_PROVIDER_SITE_OTHER): Payer: 59 | Admitting: Family Medicine

## 2017-12-15 ENCOUNTER — Encounter: Payer: Self-pay | Admitting: Family Medicine

## 2017-12-15 VITALS — BP 134/72 | HR 74 | Temp 98.2°F | Ht 65.1 in | Wt 257.0 lb

## 2017-12-15 DIAGNOSIS — L0211 Cutaneous abscess of neck: Secondary | ICD-10-CM

## 2017-12-15 DIAGNOSIS — E079 Disorder of thyroid, unspecified: Secondary | ICD-10-CM | POA: Diagnosis not present

## 2017-12-15 DIAGNOSIS — E119 Type 2 diabetes mellitus without complications: Secondary | ICD-10-CM

## 2017-12-15 DIAGNOSIS — I10 Essential (primary) hypertension: Secondary | ICD-10-CM

## 2017-12-15 DIAGNOSIS — L03221 Cellulitis of neck: Secondary | ICD-10-CM | POA: Diagnosis not present

## 2017-12-15 LAB — COMPREHENSIVE METABOLIC PANEL
ALBUMIN: 4.1 g/dL (ref 3.5–5.2)
ALT: 13 U/L (ref 0–35)
AST: 13 U/L (ref 0–37)
Alkaline Phosphatase: 63 U/L (ref 39–117)
BUN: 10 mg/dL (ref 6–23)
CALCIUM: 9.7 mg/dL (ref 8.4–10.5)
CO2: 29 meq/L (ref 19–32)
CREATININE: 0.76 mg/dL (ref 0.40–1.20)
Chloride: 105 mEq/L (ref 96–112)
GFR: 102.67 mL/min (ref >60.00)
Glucose, Bld: 151 mg/dL — ABNORMAL HIGH (ref 70–99)
Potassium: 4.5 mEq/L (ref 3.5–5.1)
Sodium: 141 mEq/L (ref 135–145)
Total Bilirubin: 0.5 mg/dL (ref 0.2–1.2)
Total Protein: 7.6 g/dL (ref 6.0–8.3)

## 2017-12-15 LAB — LIPID PANEL
Cholesterol: 151 mg/dL (ref 0–200)
HDL: 38.2 mg/dL — AB (ref >39.00)
LDL Cholesterol: 94 mg/dL (ref 0–99)
NONHDL: 112.4
TRIGLYCERIDES: 93 mg/dL (ref 0.0–149.0)
Total CHOL/HDL Ratio: 4
VLDL: 18.6 mg/dL (ref 0.0–40.0)

## 2017-12-15 LAB — TSH: TSH: 2.07 u[IU]/mL (ref 0.35–4.50)

## 2017-12-15 LAB — MICROALBUMIN / CREATININE URINE RATIO
CREATININE, U: 75.9 mg/dL
MICROALB/CREAT RATIO: 0.9 mg/g (ref 0.0–30.0)
Microalb, Ur: <0.7 mg/dL (ref 0.0–1.9)

## 2017-12-15 NOTE — Assessment & Plan Note (Signed)
BP is well controlled Continue metoprolol Follow low salt diet Remain compliant with CPAP

## 2017-12-15 NOTE — Assessment & Plan Note (Signed)
Improved, continues to follow with surgeon.  Has plenty of wound care supplies at home.

## 2017-12-15 NOTE — Assessment & Plan Note (Signed)
Update TSH

## 2017-12-15 NOTE — Progress Notes (Signed)
Jean HeightLetitia Gowen - 52 y.o. female MRN [id number]  Date of birth: 09-Aug-1965  Subjective Chief Complaint  Patient presents with  . Follow-up    doing well-fasting for labs    HPI Jean HeightLetitia Berry is a 52 y.o. female with history of HTN, T2DM, thyroid disease and recent neck abscess here today for follow up of chronic conditions.   -T2DM:  Monitoring blood sugars at home regularly, doesn't like checking however due to pain from lancet device.  Good readings at home, denies symptoms of hypoglycemia.  She is using levemir 15 units twice per day.  She doesn't have to use humalog very often.  She still doesn't do a whole lot for activity.  She is trying to watch her diet.   -HTN:  Treated with metoprolol 25mg .  Taking regularly and doing well with this.  Denies side effects including symptoms of hypotension or bradycardia.  She is without anginal symptoms, headache, shortness of breath, palpitations.   -neck abscess:  Continues to follow with surgeon after debridement.  Was having some increased pain and was restarted on antibiotics.  She has completed this and pain has improved and she is no longer having headaches.  She still has some blood tinged drainage but surgeon told her that it appears to be continuing to heal well. She has plenty of supplies for dressing changes.    ROS:  A comprehensive ROS was completed and negative except as noted per HPI  Allergies  Allergen Reactions  . Bee Venom Anaphylaxis  . Latex Rash    Past Medical History:  Diagnosis Date  . Arthritis   . Diabetes mellitus without complication (HCC)   . Gout flare   . Lower extremity edema 11/18/2016  . Osteoarthritis of back   . Palpitations   . Shortness of breath 11/18/2016  . Thyroid disease    left lobect approx 2012--benign per pt    Past Surgical History:  Procedure Laterality Date  . COLONOSCOPY    . GALLBLADDER SURGERY    . IRRIGATION AND DEBRIDEMENT ABSCESS N/A 01/08/2017   Procedure: IRRIGATION AND  DEBRIDEMENT ABSCESS POSTERIOR NECK;  Surgeon: Andria MeuseWhite, Christopher M, MD;  Location: [facility] OR;  Service: General;  Laterality: N/A;  . IRRIGATION AND DEBRIDEMENT ABSCESS N/A 10/28/2017   Procedure: IRRIGATION AND DEBRIDEMENT OF NECK ABSCESS;  Surgeon: Glenna FellowsHoxworth, Benjamin, MD;  Location: [facility] OR;  Service: General;  Laterality: N/A;  . OVARIAN CYST SURGERY Right   . THYROID SURGERY Left   . TONSILECTOMY, ADENOIDECTOMY, BILATERAL MYRINGOTOMY AND TUBES    . TUBAL LIGATION    . UPPER GASTROINTESTINAL ENDOSCOPY      Social History   Socioeconomic History  . Marital status: Single    Spouse name: Not on file  . Number of children: Not on file  . Years of education: Not on file  . Highest education level: Not on file  Occupational History  . Not on file  Social Needs  . Financial resource strain: Not on file  . Food insecurity:    Worry: Not on file    Inability: Not on file  . Transportation needs:    Medical: Not on file    Non-medical: Not on file  Tobacco Use  . Smoking status: Never Smoker  . Smokeless tobacco: Never Used  Substance and Sexual Activity  . Alcohol use: No  . Drug use: No  . Sexual activity: Not on file  Lifestyle  . Physical activity:    Days per week: Not on file  Minutes per session: Not on file  . Stress: Not on file  Relationships  . Social connections:    Talks on phone: Not on file    Gets together: Not on file    Attends religious service: Not on file    Active member of club or organization: Not on file    Attends meetings of clubs or organizations: Not on file    Relationship status: Not on file  Other Topics Concern  . Not on file  Social History Narrative  . Not on file    Family History  Problem Relation Age of Onset  . Lung cancer Mother   . Heart disease Mother   . Diabetes Maternal Grandmother   . Heart attack Maternal Grandmother     Health Maintenance  Topic Date Due  . PNEUMOCOCCAL POLYSACCHARIDE VACCINE AGE 35-64 HIGH RISK   09/13/1967  . OPHTHALMOLOGY EXAM  09/13/1975  . URINE MICROALBUMIN  09/13/1975  . TETANUS/TDAP  09/12/1984  . PAP SMEAR  09/13/1986  . MAMMOGRAM  09/13/2015  . COLONOSCOPY  09/13/2015  . FOOT EXAM  12/18/2016  . INFLUENZA VACCINE  11/03/2017  . HEMOGLOBIN A1C  04/30/2018  . HIV Screening  Completed    ----------------------------------------------------------------------------------------------------------------------------------------------------------------------------------------------------------------- Physical Exam BP 134/72   Pulse 74   Temp 98.2 F (36.8 C) (Oral)   Ht 5' 5.1" (1.654 m)   Wt 257 lb (116.6 kg)   SpO2 98%   BMI 42.64 kg/m   Physical Exam  Constitutional: She is oriented to person, place, and time. She appears well-nourished. No distress.  HENT:  Head: Normocephalic and atraumatic.  Mouth/Throat: Oropharynx is clear and moist.  Eyes: No scleral icterus.  Neck: Neck supple. No thyromegaly present.  Cardiovascular: Normal rate, regular rhythm and normal heart sounds.  Pulmonary/Chest: Effort normal and breath sounds normal.  Musculoskeletal: She exhibits edema (trace).  Lymphadenopathy:    She has no cervical adenopathy.  Neurological: She is alert and oriented to person, place, and time. No cranial nerve deficit.  Skin: Skin is warm and dry. No rash noted.  Back of neck covered with dressing.   Psychiatric: She has a normal mood and affect. Her behavior is normal.    ------------------------------------------------------------------------------------------------------------------------------------------------------------------------------------------------------------------- Assessment and Plan  Type 2 diabetes mellitus (HCC) Blood sugars poorly controlled previously, home readings improved Update labs today She will continue to monitor glucose at home-recommend trying different lancet device which may be more comfortable for her.  She will  continue current medications Follow low carb diet.  Discussed pneumovax today, she declines.  Given information on this  Thyroid disease Update TSH  Essential hypertension BP is well controlled Continue metoprolol Follow low salt diet Remain compliant with CPAP  Cellulitis and abscess of neck Improved, continues to follow with surgeon.  Has plenty of wound care supplies at home.   Will continue to address ongoing health maintenance items at follow up appts.

## 2017-12-15 NOTE — Patient Instructions (Signed)
It was nice to see you again today! Continue your current medications Check to see if a different finger lancet device may be more comfortable. We'll be in touch with lab results I would like to see you back in about 2 months

## 2017-12-15 NOTE — Assessment & Plan Note (Signed)
Blood sugars poorly controlled previously, home readings improved Update labs today She will continue to monitor glucose at home-recommend trying different lancet device which may be more comfortable for her.  She will continue current medications Follow low carb diet.  Discussed pneumovax today, she declines.  Given information on this

## 2018-01-17 ENCOUNTER — Encounter: Payer: Self-pay | Admitting: Family Medicine

## 2018-01-17 ENCOUNTER — Ambulatory Visit (INDEPENDENT_AMBULATORY_CARE_PROVIDER_SITE_OTHER): Payer: 59 | Admitting: Family Medicine

## 2018-01-17 VITALS — BP 110/70 | HR 86 | Temp 98.0°F | Wt 291.2 lb

## 2018-01-17 DIAGNOSIS — Z794 Long term (current) use of insulin: Secondary | ICD-10-CM | POA: Diagnosis not present

## 2018-01-17 DIAGNOSIS — E119 Type 2 diabetes mellitus without complications: Secondary | ICD-10-CM

## 2018-01-17 DIAGNOSIS — R109 Unspecified abdominal pain: Secondary | ICD-10-CM

## 2018-01-17 LAB — POC URINALSYSI DIPSTICK (AUTOMATED)
Bilirubin, UA: NEGATIVE
Blood, UA: NEGATIVE
Glucose, UA: NEGATIVE
KETONES UA: NEGATIVE
LEUKOCYTES UA: NEGATIVE
Nitrite, UA: NEGATIVE
PROTEIN UA: NEGATIVE
SPEC GRAV UA: 1.025 (ref 1.010–1.025)
UROBILINOGEN UA: 0.2 U/dL
pH, UA: 6 (ref 5.0–8.0)

## 2018-01-17 LAB — HEMOGLOBIN A1C: HEMOGLOBIN A1C: 8.6 % — AB (ref 4.6–6.5)

## 2018-01-17 MED ORDER — LIRAGLUTIDE 18 MG/3ML ~~LOC~~ SOPN: PEN_INJECTOR | SUBCUTANEOUS | 6 refills | Status: DC

## 2018-01-17 MED ORDER — CYCLOBENZAPRINE HCL 10 MG PO TABS: 10.0000 mg | ORAL_TABLET | Freq: Three times a day (TID) | ORAL | 0 refills | Status: DC | PRN

## 2018-01-17 MED ORDER — MELOXICAM 7.5 MG PO TABS: 7.5000 mg | ORAL_TABLET | Freq: Every day | ORAL | 0 refills | Status: DC

## 2018-01-17 NOTE — Assessment & Plan Note (Signed)
Seems to be MSK in etiology.  -UA normal.  -Rx for meloxicam and flexeril -Recommend application of heat to area.

## 2018-01-17 NOTE — Assessment & Plan Note (Signed)
-  Update a1c today -Blood sugars at home are not adequately controlled.  -Add victoza, hopefully she'll see some weight loss with this as well.  Prior thyroid nodule was benign.  Discussed potential for nausea initially with this.  -Decrese levemir to 10 units for now, will titrate back up as needed.

## 2018-01-17 NOTE — Progress Notes (Signed)
Jean Berry - 52 y.o. female MRN [id number]  Date of birth: 1965-04-18  Subjective Chief Complaint  Patient presents with  . Flank Pain    HPI Jean Berry is a 52 y.o. female with history of T2DM and thyroid disease s/p L lobectomy for benign nodule here today with c/o flank pain and diabetes.  -Flank pain:  Reports pain in L flank that started about 1 week ago.  Denies any preceding injury.  Describes as sharp pain that catches when she tries to take a deep breath.  Painful to lay on L side and pain with palpation.  She denies fever, chills, dysuria, hematuria, changes to bowels, chest tightness, shortness of breath.  She has not tried anything for treatment so far.   -Diabetes:  Reports she has had a couple of blood sugar readings into the mid 200's with averages around 150-160.  She denies symptoms of hypoglycemia.  She has been trying to work on weight loss and is eating much healthier but is still having difficulty.    ROS:  A comprehensive ROS was completed and negative except as noted per HPI  Allergies  Allergen Reactions  . Bee Venom Anaphylaxis  . Latex Rash    Past Medical History:  Diagnosis Date  . Arthritis   . Diabetes mellitus without complication (HCC)   . Gout flare   . Lower extremity edema 11/18/2016  . Osteoarthritis of back   . Palpitations   . Shortness of breath 11/18/2016  . Thyroid disease    left lobect approx 2012--benign per pt    Past Surgical History:  Procedure Laterality Date  . COLONOSCOPY    . GALLBLADDER SURGERY    . IRRIGATION AND DEBRIDEMENT ABSCESS N/A 01/08/2017   Procedure: IRRIGATION AND DEBRIDEMENT ABSCESS POSTERIOR NECK;  Surgeon: Andria Meuse, MD;  Location: [facility] OR;  Service: General;  Laterality: N/A;  . IRRIGATION AND DEBRIDEMENT ABSCESS N/A 10/28/2017   Procedure: IRRIGATION AND DEBRIDEMENT OF NECK ABSCESS;  Surgeon: Glenna Fellows, MD;  Location: [facility] OR;  Service: General;  Laterality: N/A;  . OVARIAN CYST  SURGERY Right   . THYROID SURGERY Left   . TONSILECTOMY, ADENOIDECTOMY, BILATERAL MYRINGOTOMY AND TUBES    . TUBAL LIGATION    . UPPER GASTROINTESTINAL ENDOSCOPY      Social History   Socioeconomic History  . Marital status: Single    Spouse name: Not on file  . Number of children: Not on file  . Years of education: Not on file  . Highest education level: Not on file  Occupational History  . Not on file  Social Needs  . Financial resource strain: Not on file  . Food insecurity:    Worry: Not on file    Inability: Not on file  . Transportation needs:    Medical: Not on file    Non-medical: Not on file  Tobacco Use  . Smoking status: Never Smoker  . Smokeless tobacco: Never Used  Substance and Sexual Activity  . Alcohol use: No  . Drug use: No  . Sexual activity: Not on file  Lifestyle  . Physical activity:    Days per week: Not on file    Minutes per session: Not on file  . Stress: Not on file  Relationships  . Social connections:    Talks on phone: Not on file    Gets together: Not on file    Attends religious service: Not on file    Active member of club or organization:  Not on file    Attends meetings of clubs or organizations: Not on file    Relationship status: Not on file  Other Topics Concern  . Not on file  Social History Narrative  . Not on file    Family History  Problem Relation Age of Onset  . Lung cancer Mother   . Heart disease Mother   . Diabetes Maternal Grandmother   . Heart attack Maternal Grandmother     Health Maintenance  Topic Date Due  . PNEUMOCOCCAL POLYSACCHARIDE VACCINE AGE 70-64 HIGH RISK  09/13/1967  . OPHTHALMOLOGY EXAM  09/13/1975  . TETANUS/TDAP  09/12/1984  . PAP SMEAR  09/13/1986  . MAMMOGRAM  09/13/2015  . COLONOSCOPY  09/13/2015  . FOOT EXAM  12/18/2016  . INFLUENZA VACCINE  11/03/2017  . HEMOGLOBIN A1C  04/30/2018  . URINE MICROALBUMIN  12/16/2018  . HIV Screening  Completed     ----------------------------------------------------------------------------------------------------------------------------------------------------------------------------------------------------------------- Physical Exam BP 110/70   Pulse 86   Temp 98 F (36.7 C)   Wt 291 lb 3.2 oz (132.1 kg)   SpO2 98%   BMI 48.31 kg/m   Physical Exam  Constitutional: She is oriented to person, place, and time. She appears well-nourished. No distress.  HENT:  Head: Normocephalic and atraumatic.  Mouth/Throat: Oropharynx is clear and moist.  Eyes: No scleral icterus.  Neck: Neck supple. No thyromegaly present.  Cardiovascular: Normal rate, regular rhythm and normal heart sounds.  Pulmonary/Chest: Effort normal and breath sounds normal.  TTP along L flank around lower rib area.  No CVA tenderness.  No rash noted.   Abdominal: Soft. Bowel sounds are normal.  Lymphadenopathy:    She has no cervical adenopathy.  Neurological: She is alert and oriented to person, place, and time. No cranial nerve deficit.  Skin: Skin is warm and dry.  Psychiatric: She has a normal mood and affect. Her behavior is normal.    ------------------------------------------------------------------------------------------------------------------------------------------------------------------------------------------------------------------- Assessment and Plan  Left flank pain Seems to be MSK in etiology.  -UA normal.  -Rx for meloxicam and flexeril -Recommend application of heat to area.   Type 2 diabetes mellitus (HCC) -Update a1c today -Blood sugars at home are not adequately controlled.  -Add victoza, hopefully she'll see some weight loss with this as well.  Prior thyroid nodule was benign.  Discussed potential for nausea initially with this.  -Decrese levemir to 10 units for now, will titrate back up as needed.

## 2018-01-17 NOTE — Patient Instructions (Signed)
-  For flank pain:  Start flexeril and meloxicam as needed.  Try some heat to this area. Let me know if not improving.   -For diabetes:  Reduce levemir to 10units daily.  Start Victoza daily.  See me again in 6 weeks.

## 2018-01-23 NOTE — Progress Notes (Signed)
A1c is improved from previous check 11.3-->8.6.  I would expect continued improvement with addition of the victoza.

## 2018-02-15 ENCOUNTER — Ambulatory Visit: Payer: 59 | Admitting: Family Medicine

## 2018-03-10 ENCOUNTER — Ambulatory Visit (INDEPENDENT_AMBULATORY_CARE_PROVIDER_SITE_OTHER): Payer: 59 | Admitting: Family Medicine

## 2018-03-10 ENCOUNTER — Encounter: Payer: Self-pay | Admitting: Family Medicine

## 2018-03-10 VITALS — BP 92/66 | HR 99 | Temp 97.9°F | Wt 295.4 lb

## 2018-03-10 DIAGNOSIS — R300 Dysuria: Secondary | ICD-10-CM

## 2018-03-10 DIAGNOSIS — E119 Type 2 diabetes mellitus without complications: Secondary | ICD-10-CM | POA: Diagnosis not present

## 2018-03-10 DIAGNOSIS — J0110 Acute frontal sinusitis, unspecified: Secondary | ICD-10-CM | POA: Diagnosis not present

## 2018-03-10 DIAGNOSIS — Z794 Long term (current) use of insulin: Secondary | ICD-10-CM

## 2018-03-10 DIAGNOSIS — E049 Nontoxic goiter, unspecified: Secondary | ICD-10-CM | POA: Diagnosis not present

## 2018-03-10 LAB — POCT URINALYSIS DIPSTICK
Bilirubin, UA: NEGATIVE
Glucose, UA: NEGATIVE
KETONES UA: NEGATIVE
LEUKOCYTES UA: NEGATIVE
NITRITE UA: NEGATIVE
Protein, UA: NEGATIVE
RBC UA: NEGATIVE
SPEC GRAV UA: 1.025 (ref 1.010–1.025)
Urobilinogen, UA: 0.2 E.U./dL
pH, UA: 6 (ref 5.0–8.0)

## 2018-03-10 MED ORDER — AMOXICILLIN 500 MG PO CAPS: 500.0000 mg | ORAL_CAPSULE | Freq: Three times a day (TID) | ORAL | 0 refills | Status: DC

## 2018-03-10 NOTE — Assessment & Plan Note (Signed)
-  amoxicillin 500mg  tid -increased fluids -home humidifier -May continue otc symptomatic treatment as needed.

## 2018-03-10 NOTE — Progress Notes (Signed)
Jean Berry - 52 y.o. female MRN [id number]  Date of birth: [birth year]  Subjective Chief Complaint  Patient presents with  . Follow-up    HPI Jean Berry is a 52 y.o. female with history of T2DM, thyroid disease with partial thyroidectomy with benign pathology here today for follow up.  -T2DM:  Current treatment with victoza, levemir and humalog.  Glucose typically around 100 since addition of victoza.  Lowest readings around 88.  She has only need humalog on occasion now.    -Nasal congestion/Sinus pain:  Reports sinus pain and congestion with thick phlegm for the past 7 days.  She also has some pain in the R side of jaw when chewing or clenching jaw.  Pain is described as sharp pain.  She denies fever, chills, nausea, vomiting, diarrhea.  -Dysuria:  Reports experiencing dysuria if holding her bladder for too long.  Denies urinary frequency, flank pain, or hematuria.    ROS:  A comprehensive ROS was completed and negative except as noted per HPI  Allergies  Allergen Reactions  . Bee Venom Anaphylaxis  . Latex Rash    Past Medical History:  Diagnosis Date  . Arthritis   . Diabetes mellitus without complication (HCC)   . Gout flare   . Lower extremity edema 11/18/2016  . Osteoarthritis of back   . Palpitations   . Shortness of breath 11/18/2016  . Thyroid disease    left lobect approx 2012--benign per pt    Past Surgical History:  Procedure Laterality Date  . COLONOSCOPY    . GALLBLADDER SURGERY    . IRRIGATION AND DEBRIDEMENT ABSCESS N/A 01/08/2017   Procedure: IRRIGATION AND DEBRIDEMENT ABSCESS POSTERIOR NECK;  Surgeon: Andria Meuse, MD;  Location: [facility] OR;  Service: General;  Laterality: N/A;  . IRRIGATION AND DEBRIDEMENT ABSCESS N/A 10/28/2017   Procedure: IRRIGATION AND DEBRIDEMENT OF NECK ABSCESS;  Surgeon: Glenna Fellows, MD;  Location: [facility];  Service: General;  Laterality: N/A;  . OVARIAN CYST SURGERY Right   . THYROID SURGERY Left   .  TONSILECTOMY, ADENOIDECTOMY, BILATERAL MYRINGOTOMY AND TUBES    . TUBAL LIGATION    . UPPER GASTROINTESTINAL ENDOSCOPY      Social History   Socioeconomic History  . Marital status: Single    Spouse name: Not on file  . Number of children: Not on file  . Years of education: Not on file  . Highest education level: Not on file  Occupational History  . Not on file  Social Needs  . Financial resource strain: Not on file  . Food insecurity:    Worry: Not on file    Inability: Not on file  . Transportation needs:    Medical: Not on file    Non-medical: Not on file  Tobacco Use  . Smoking status: Never Smoker  . Smokeless tobacco: Never Used  Substance and Sexual Activity  . Alcohol use: No  . Drug use: No  . Sexual activity: Not on file  Lifestyle  . Physical activity:    Days per week: Not on file    Minutes per session: Not on file  . Stress: Not on file  Relationships  . Social connections:    Talks on phone: Not on file    Gets together: Not on file    Attends religious service: Not on file    Active member of club or organization: Not on file    Attends meetings of clubs or organizations: Not on file  Relationship status: Not on file  Other Topics Concern  . Not on file  Social History Narrative  . Not on file    Family History  Problem Relation Age of Onset  . Lung cancer Mother   . Heart disease Mother   . Diabetes Maternal Grandmother   . Heart attack Maternal Grandmother     Health Maintenance  Topic Date Due  . PNEUMOCOCCAL POLYSACCHARIDE VACCINE AGE 69-64 HIGH RISK  09/13/1967  . OPHTHALMOLOGY EXAM  09/13/1975  . TETANUS/TDAP  09/12/1984  . PAP SMEAR  09/13/1986  . MAMMOGRAM  09/13/2015  . COLONOSCOPY  09/13/2015  . FOOT EXAM  12/18/2016  . INFLUENZA VACCINE  11/03/2017  . HEMOGLOBIN A1C  07/19/2018  . URINE MICROALBUMIN  12/16/2018  . HIV Screening  Completed     ----------------------------------------------------------------------------------------------------------------------------------------------------------------------------------------------------------------- Physical Exam BP 92/66   Pulse 99   Temp 97.9 F (36.6 C)   Wt 295 lb 6.4 oz (134 kg)   BMI 49.01 kg/m   Physical Exam  Constitutional: She is oriented to person, place, and time. She appears well-nourished. No distress.  HENT:  Head: Normocephalic and atraumatic.  Mouth/Throat: Oropharynx is clear and moist.  Frontal sinus tenderness.    Eyes: No scleral icterus.  Neck: Neck supple.  Cardiovascular: Normal rate, regular rhythm and normal heart sounds.  Pulmonary/Chest: Breath sounds normal.  Neurological: She is alert and oriented to person, place, and time.  Skin: Skin is warm and dry. No rash noted.  Psychiatric: She has a normal mood and affect. Her behavior is normal.    ------------------------------------------------------------------------------------------------------------------------------------------------------------------------------------------------------------------- Assessment and Plan  Enlarged thyroid -US ordered   Type 2 diabetes mellitus (HCC) -Blood sugars are well controlled.  She is frustrated by continued weight gain. Referral placed on medical nutrition therapy.   Dysuria -UA ordered   Acute non-recurrent frontal sinusitis -amoxicillin 500mg  tid -increased fluids -home humidifier -May continue otc symptomatic treatment as needed.

## 2018-03-10 NOTE — Assessment & Plan Note (Signed)
-  Blood sugars are well controlled.  She is frustrated by continued weight gain. Referral placed on medical nutrition therapy.

## 2018-03-10 NOTE — Assessment & Plan Note (Signed)
US ordered

## 2018-03-10 NOTE — Assessment & Plan Note (Signed)
UA ordered

## 2018-03-10 NOTE — Patient Instructions (Signed)
-  Try alternating cold and warm compresses to jaw.  You may use aleve or ibuprofen as well.  -You'll be called to schedule thyroid ultrasound -You will be contacted to schedule with nutritionist.  -See me again in 3 months.   Sinusitis, Adult Sinusitis is soreness and inflammation of your sinuses. Sinuses are hollow spaces in the bones around your face. They are located:  Around your eyes.  In the middle of your forehead.  Behind your nose.  In your cheekbones.  Your sinuses and nasal passages are lined with a stringy fluid (mucus). Mucus normally drains out of your sinuses. When your nasal tissues get inflamed or swollen, the mucus can get trapped or blocked so air cannot flow through your sinuses. This lets bacteria, viruses, and funguses grow, and that leads to infection. Follow these instructions at home: Medicines  Take, use, or apply over-the-counter and prescription medicines only as told by your doctor. These may include nasal sprays.  If you were prescribed an antibiotic medicine, take it as told by your doctor. Do not stop taking the antibiotic even if you start to feel better. Hydrate and Humidify  Drink enough water to keep your pee (urine) clear or pale yellow.  Use a cool mist humidifier to keep the humidity level in your home above 50%.  Breathe in steam for 10-15 minutes, 3-4 times a day or as told by your doctor. You can do this in the bathroom while a hot shower is running.  Try not to spend time in cool or dry air. Rest  Rest as much as possible.  Sleep with your head raised (elevated).  Make sure to get enough sleep each night. General instructions  Put a warm, moist washcloth on your face 3-4 times a day or as told by your doctor. This will help with discomfort.  Wash your hands often with soap and water. If there is no soap and water, use hand sanitizer.  Do not smoke. Avoid being around people who are smoking (secondhand smoke).  Keep all  follow-up visits as told by your doctor. This is important. Contact a doctor if:  You have a fever.  Your symptoms get worse.  Your symptoms do not get better within 10 days. Get help right away if:  You have a very bad headache.  You cannot stop throwing up (vomiting).  You have pain or swelling around your face or eyes.  You have trouble seeing.  You feel confused.  Your neck is stiff.  You have trouble breathing. This information is not intended to replace advice given to you by your health care provider. Make sure you discuss any questions you have with your health care provider. Document Released: 09/08/2007 Document Revised: 11/16/2015 Document Reviewed: 01/15/2015 Elsevier Interactive Patient Education  [employer]2018 [employer]

## 2018-03-13 NOTE — Progress Notes (Signed)
Normal UA

## 2018-03-20 ENCOUNTER — Ambulatory Visit
Admission: RE | Admit: 2018-03-20 | Discharge: 2018-03-20 | Disposition: A | Discharge status: Home / Self Care | Payer: 59 | Source: Ambulatory Visit | Attending: Family Medicine | Admitting: Family Medicine

## 2018-03-20 DIAGNOSIS — E049 Nontoxic goiter, unspecified: Secondary | ICD-10-CM | POA: Diagnosis not present

## 2018-03-22 NOTE — Progress Notes (Signed)
Thyroid US was normal.  Small benign calcification noted in R lower portion of thyroid.  No further imaging or work-up needed.

## 2018-04-11 ENCOUNTER — Encounter: Payer: 59 | Attending: Family Medicine | Admitting: *Deleted

## 2018-04-11 VITALS — Ht 65.5 in | Wt 297.5 lb

## 2018-04-11 DIAGNOSIS — E119 Type 2 diabetes mellitus without complications: Secondary | ICD-10-CM | POA: Insufficient documentation

## 2018-04-11 NOTE — Patient Instructions (Signed)
Plan:  Aim for 3 Carb Choices per meal (45 grams) +/- 1 either way  Aim for 0-2 Carbs per snack if hungry  Include protein in moderation with your meals and snacks Consider reading food labels for Total Carbohydrate of foods Continue with your activity level by Zumba and walking for 30-60 minutes several days a week as tolerated Continue checking BG at alternate times per day  Continue taking medication as directed by MD (ask MD about taking Levemir in one injection instead of two?)

## 2018-04-11 NOTE — Progress Notes (Signed)
Diabetes Self-Management Education  Visit Type: First/Initial  Appt. Start Time: 1540 Appt. End Time: 1710  04/11/2018  [name], identified by name and date of birth, is a 53 y.o. female with a diagnosis of Diabetes: Type 2. She states she is feeling burned out with her diabetes and frustrated with ongoing weight problems. 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 She had cysts removed from the back of her neck in 2018 and again in 2019, where her BG had become uncontrolled. She has been working with her MD to try and get them down since. She enjoys walking and Zumba several days a week.   ASSESSMENT  Height 5' 5.5" (1.664 m), weight 297 lb 8 oz (134.9 kg). Body mass index is 48.75 kg/m.  Diabetes Self-Management Education - 04/11/18 1810      Visit Information   Visit Type  First/Initial      Initial Visit   Diabetes Type  Type 2    Are you currently following a meal plan?  No    Are you taking your medications as prescribed?  Yes    Date Diagnosed  5 years agi      Health Coping   How would you rate your overall health?  Good      Psychosocial Assessment   Patient Belief/Attitude about Diabetes  Defeat/Burnout    Self-care barriers  None    Other persons present  Patient    Patient Concerns  Nutrition/Meal planning;Weight Control;Glycemic Control;Problem Solving    Special Needs  None    Preferred Learning Style  No preference indicated    Learning Readiness  Change in progress    How often do you need to have someone help you when you read instructions, pamphlets, or other written materials from your doctor or pharmacy?  1 - Never    What is the last grade level you completed in school?  Bachelors      Pre-Education Assessment   Patient understands the diabetes disease and treatment process.  Needs Review    Patient understands incorporating nutritional management into lifestyle.  Needs Instruction     Patient undertands incorporating physical activity into lifestyle.  Needs Review    Patient understands using medications safely.  Needs Review    Patient understands monitoring blood glucose, interpreting and using results  Needs Review    Patient understands prevention, detection, and treatment of chronic complications.  Needs Review    Patient understands how to develop strategies to address psychosocial issues.  Needs Review    Patient understands how to develop strategies to promote health/change behavior.  Needs Review      Complications   Last HgB A1C per patient/outside source  8.6 %    How often do you check your blood sugar?  1-2 times/day    Fasting Blood glucose range (mg/dL)  69-629    Number of hypoglycemic episodes per month  0    Have you had a dilated eye exam in the past 12 months?  Yes    Have you had a dental exam in the past 12 months?  Yes    Are you checking your feet?  Yes    How many days per week are you checking your feet?  7      Dietary Intake   Breakfast  eggs with spinach OR flavored oatmeal with nuts and raisins x 2 pkts wtih 2% milk and  [place] bacon    Lunch  1 PM: small salad with veggies, occaionally with cheese, small amount dressing OR grilled chicken and 7 crackers    Dinner  meat, vegetables and salad, no starches especially if had at lunch    Beverage(s)  water, sugar free flavored water, tang,       Exercise   Exercise Type  Moderate (swimming / aerobic walking)   Zumba, walking on treadmill at home to DVD's   How many days per week to you exercise?  3.5    How many minutes per day do you exercise?  30    Total minutes per week of exercise  105      Patient Education   Previous Diabetes Education  Yes (please comment)   when first diagnosed   Disease state   Definition of diabetes, type 1 and 2, and the diagnosis of diabetes;Factors that contribute to the development of diabetes;Explored patient's options for treatment of their diabetes     Nutrition management   Role of diet in the treatment of diabetes and the relationship between the three main macronutrients and blood glucose level;Food label reading, portion sizes and measuring food.;Carbohydrate counting;Reviewed blood glucose goals for pre and post meals and how to evaluate the patients' food intake on their blood glucose level.    Physical activity and exercise   Role of exercise on diabetes management, blood pressure control and cardiac health.;Helped patient identify appropriate exercises in relation to his/her diabetes, diabetes complications and other health issue.    Medications  Reviewed patients medication for diabetes, action, purpose, timing of dose and side effects.    Monitoring  Identified appropriate SMBG and/or A1C goals.    Chronic complications  Relationship between chronic complications and blood glucose control    Psychosocial adjustment  Worked with patient to identify barriers to care and solutions;Role of stress on diabetes;Identified and addressed patients feelings and concerns about diabetes      Individualized Goals (developed by patient)   Nutrition  Follow meal plan discussed    Physical Activity  Exercise 3-5 times per week    Medications  take my medication as prescribed    Monitoring   test blood glucose pre and post meals as discussed      Post-Education Assessment   Patient understands the diabetes disease and treatment process.  Demonstrates understanding / competency    Patient understands incorporating nutritional management into lifestyle.  Demonstrates understanding / competency    Patient undertands incorporating physical activity into lifestyle.  Demonstrates understanding / competency    Patient understands using medications safely.  Demonstrates understanding / competency    Patient understands monitoring blood glucose, interpreting and using results  Demonstrates understanding / competency    Patient understands prevention,  detection, and treatment of acute complications.  Demonstrates understanding / competency    Patient understands prevention, detection, and treatment of chronic complications.  Demonstrates understanding / competency    Patient understands how to develop strategies to address psychosocial issues.  Demonstrates understanding / competency    Patient understands how to develop strategies to promote health/change behavior.  Demonstrates understanding / competency      Outcomes   Expected Outcomes  Demonstrated interest in learning. Expect positive outcomes    Future DMSE  3-4 months    Program Status  Completed       Individualized Plan for Diabetes Self-Management Training:   Learning Objective:  Patient will have a greater understanding of diabetes self-management. Patient education  plan is to attend individual and/or group sessions per assessed needs and concerns.   Plan:   Patient Instructions  Plan:  Aim for 3 Carb Choices per meal (45 grams) +/- 1 either way  Aim for 0-2 Carbs per snack if hungry  Include protein in moderation with your meals and snacks Consider reading food labels for Total Carbohydrate of foods Continue with your activity level by Zumba and walking for 30-60 minutes several days a week as tolerated Continue checking BG at alternate times per day  Continue taking medication as directed by MD (ask MD about taking Levemir in one injection instead of two?)  Expected Outcomes:  Demonstrated interest in learning. Expect positive outcomes  Education material provided: Food label handouts, A1C conversion sheet, Meal plan card and Carbohydrate counting sheet, Libre CGM Brochure, Diabetes Medication handout, Insulin Action handout  If problems or questions, patient to contact team via:  Phone  Future DSME appointment: 3-4 months

## 2018-05-11 DIAGNOSIS — E119 Type 2 diabetes mellitus without complications: Secondary | ICD-10-CM | POA: Diagnosis not present

## 2018-05-11 DIAGNOSIS — R0602 Shortness of breath: Secondary | ICD-10-CM | POA: Diagnosis not present

## 2018-05-11 DIAGNOSIS — R635 Abnormal weight gain: Secondary | ICD-10-CM | POA: Diagnosis not present

## 2018-05-26 DIAGNOSIS — M545 Low back pain: Secondary | ICD-10-CM | POA: Diagnosis not present

## 2018-05-26 DIAGNOSIS — M546 Pain in thoracic spine: Secondary | ICD-10-CM | POA: Diagnosis not present

## 2018-05-26 DIAGNOSIS — M1711 Unilateral primary osteoarthritis, right knee: Secondary | ICD-10-CM | POA: Diagnosis not present

## 2018-05-26 DIAGNOSIS — M170 Bilateral primary osteoarthritis of knee: Secondary | ICD-10-CM | POA: Diagnosis not present

## 2018-05-26 DIAGNOSIS — M1712 Unilateral primary osteoarthritis, left knee: Secondary | ICD-10-CM | POA: Diagnosis not present

## 2018-06-16 ENCOUNTER — Ambulatory Visit: Payer: 59 | Admitting: Family Medicine

## 2018-07-21 ENCOUNTER — Other Ambulatory Visit: Payer: Self-pay | Admitting: Nurse Practitioner

## 2018-08-11 ENCOUNTER — Telehealth (INDEPENDENT_AMBULATORY_CARE_PROVIDER_SITE_OTHER): Payer: 59 | Admitting: Family Medicine

## 2018-08-11 ENCOUNTER — Encounter: Payer: Self-pay | Admitting: Family Medicine

## 2018-08-11 VITALS — Ht 65.5 in

## 2018-08-11 DIAGNOSIS — Z13220 Encounter for screening for lipoid disorders: Secondary | ICD-10-CM | POA: Diagnosis not present

## 2018-08-11 DIAGNOSIS — E079 Disorder of thyroid, unspecified: Secondary | ICD-10-CM | POA: Diagnosis not present

## 2018-08-11 DIAGNOSIS — I10 Essential (primary) hypertension: Secondary | ICD-10-CM

## 2018-08-11 DIAGNOSIS — E119 Type 2 diabetes mellitus without complications: Secondary | ICD-10-CM | POA: Diagnosis not present

## 2018-08-11 DIAGNOSIS — J302 Other seasonal allergic rhinitis: Secondary | ICD-10-CM

## 2018-08-11 NOTE — Assessment & Plan Note (Signed)
-   BP stable, continue metoprolol °

## 2018-08-11 NOTE — Assessment & Plan Note (Signed)
Update TSH

## 2018-08-11 NOTE — Assessment & Plan Note (Signed)
-  Diabetes has been well controlled, recommend continuation of current medication.  -Will come in for lab visit for updated a1c next week.

## 2018-08-11 NOTE — Progress Notes (Signed)
Jean Berry - 53 y.o. female MRN [id number]  Date of birth: 1965-05-14   This visit type was conducted due to national recommendations for restrictions regarding the COVID-19 Pandemic (e.g. social distancing).  This format is felt to be most appropriate for this patient at this time.  All issues noted in this document were discussed and addressed.  No physical exam was performed (except for noted visual exam findings with Video Visits).  I discussed the limitations of evaluation and management by telemedicine and the availability of in person appointments. The patient expressed understanding and agreed to proceed.  I connected with@ on 08/11/18 at  3:00 PM EDT by a video enabled telemedicine application and verified that I am speaking with the correct person using two identifiers.   Patient Location: Home [address] [address]   Provider location:   Home office  Chief Complaint  Patient presents with  . Follow-up    diabetes    HPI  Jean Berry is a 53 y.o. female who presents via [employer] (consulting) for a telehealth visit today.  She is following up today for diabetes as well as allergies.   -T2DM:  Current management with levemir 15 units daily, victoza and humalog as needed based on sliding scale. She reports home glucose readings of 79-115. Has not required humalog in several months.  Was able to lose about 8 lbs previously but eating habits have worsened due to food supply changes during COVID-19.  She plans to get back on track in the next couple of weeks. She denies episodes of hypoglycemia.   -HTN:  Well managed with metoprolol.  Denies side effects including fatigue, chest pain, shortness of breath, palpitations, headache .   -Allergies:  Reports increased congestion and sinus pressure.  She has been using claritin and would like a refill on this.     ROS:  A comprehensive ROS was completed and negative except as noted per HPI  Past  Medical History:  Diagnosis Date  . Arthritis   . Diabetes mellitus without complication ([place])   . Gout flare   . Lower extremity edema 11/18/2016  . Osteoarthritis of back   . Palpitations   . Shortness of breath 11/18/2016  . Thyroid disease    left lobect approx 2012--benign per pt    Past Surgical History:  Procedure Laterality Date  . COLONOSCOPY    . GALLBLADDER SURGERY    . IRRIGATION AND DEBRIDEMENT ABSCESS N/A 01/08/2017   Procedure: IRRIGATION AND DEBRIDEMENT ABSCESS POSTERIOR NECK;  Surgeon: Ileana Roup, MD;  Location: [place];  Service: General;  Laterality: N/A;  . IRRIGATION AND DEBRIDEMENT ABSCESS N/A 10/28/2017   Procedure: IRRIGATION AND DEBRIDEMENT OF NECK ABSCESS;  Surgeon: Excell Seltzer, MD;  Location: [facility];  Service: General;  Laterality: N/A;  . OVARIAN CYST SURGERY Right   . THYROID SURGERY Left   . TONSILECTOMY, ADENOIDECTOMY, BILATERAL MYRINGOTOMY AND TUBES    . TUBAL LIGATION    . UPPER GASTROINTESTINAL ENDOSCOPY      Family History  Problem Relation Age of Onset  . Lung cancer Mother   . Heart disease Mother   . Diabetes Maternal Grandmother   . Heart attack Maternal Grandmother     Social History   Socioeconomic History  . Marital status: Single    Spouse name: Not on file  . Number of children: Not on file  . Years of education: Not on file  . Highest education level: Not on  file  Occupational History  . Not on file  Social Needs  . Financial resource strain: Not on file  . Food insecurity:    Worry: Not on file    Inability: Not on file  . Transportation needs:    Medical: Not on file    Non-medical: Not on file  Tobacco Use  . Smoking status: Never Smoker  . Smokeless tobacco: Never Used  Substance and Sexual Activity  . Alcohol use: No  . Drug use: No  . Sexual activity: Not on file  Lifestyle  . Physical activity:    Days per week: Not on file    Minutes per session: Not on file  . Stress: Not on file   Relationships  . Social connections:    Talks on phone: Not on file    Gets together: Not on file    Attends religious service: Not on file    Active member of club or organization: Not on file    Attends meetings of clubs or organizations: Not on file    Relationship status: Not on file  . Intimate partner violence:    Fear of current or ex partner: Not on file    Emotionally abused: Not on file    Physically abused: Not on file    Forced sexual activity: Not on file  Other Topics Concern  . Not on file  Social History Narrative  . Not on file     Current Outpatient Medications:  .  acetaminophen (TYLENOL) 500 MG tablet, Take 1,000 mg by mouth every 6 (six) hours as needed for headache., Disp: , Rfl:  .  blood glucose meter kit and supplies KIT, Dispense based on patient and insurance preference. Use up to four times daily as directed. (FOR ICD-9 250.00, 250.01)., Disp: 1 each, Rfl: 0 .  cyclobenzaprine (FLEXERIL) 10 MG tablet, Take 1 tablet (10 mg total) by mouth 3 (three) times daily as needed for muscle spasms., Disp: 30 tablet, Rfl: 0 .  glucose blood (CONTOUR NEXT TEST) test strip, Use to check glucose qid, Disp: 120 each, Rfl: 12 .  insulin detemir (LEVEMIR) 100 UNIT/ML injection, Inject 0.15 mLs (15 Units total) into the skin 2 (two) times daily., Disp: 30 mL, Rfl: 11 .  insulin lispro (HUMALOG KWIKPEN) 100 UNIT/ML KiwkPen, Use up to 14 units TID with based on sliding scale, Disp: 15 mL, Rfl: 11 .  Insulin Pen Needle (PEN NEEDLES) 31G X 6 MM MISC, Use to inject insulin up to TID, Disp: 100 each, Rfl: 11 .  liraglutide (VICTOZA) 18 MG/3ML SOPN, Inject 0.41m daily for 1 week, then increase to 1.255mdaily, Disp: 6 mL, Rfl: 6 .  loratadine (CLARITIN) 10 MG tablet, Take 10 mg by mouth daily., Disp: , Rfl: 2 .  meloxicam (MOBIC) 7.5 MG tablet, Take 1 tablet (7.5 mg total) by mouth daily., Disp: 30 tablet, Rfl: 0 .  metoprolol tartrate (LOPRESSOR) 25 MG tablet, Take 1 tablet (25 mg  total) by mouth 2 (two) times daily., Disp: 60 tablet, Rfl: 0 .  Multiple Vitamin (MULTIVITAMIN WITH MINERALS) TABS tablet, Take 1 tablet by mouth daily., Disp: , Rfl:  .  pantoprazole (PROTONIX) 40 MG tablet, Take 1 tablet (40 mg total) by mouth daily., Disp: 30 tablet, Rfl: 0  EXAM:  VITALS per patient if applicable: Ht 5' 5.5" (1.664 m)   BMI 48.75 kg/m   GENERAL: alert, oriented, appears well and in no acute distress  HEENT: atraumatic, conjunttiva clear, no obvious  abnormalities on inspection of external nose and ears  NECK: normal movements of the head and neck  LUNGS: on inspection no signs of respiratory distress, breathing rate appears normal, no obvious gross SOB, gasping or wheezing  CV: no obvious cyanosis  MS: moves all visible extremities without noticeable abnormality  PSYCH/NEURO: pleasant and cooperative, no obvious depression or anxiety, speech and thought processing grossly intact  ASSESSMENT AND PLAN:  Discussed the following assessment and plan:  Essential hypertension BP stable, continue metoprolol  Thyroid disease -Update TSH  Type 2 diabetes mellitus ([place]) -Diabetes has been well controlled, recommend continuation of current medication.  -Will come in for lab visit for updated a1c next week.   Seasonal allergies -Continue claritin and recommend addition of flonase daily.        I discussed the assessment and treatment plan with the patient. The patient was provided an opportunity to ask questions and all were answered. The patient agreed with the plan and demonstrated an understanding of the instructions.   The patient was advised to call back or seek an in-person evaluation if the symptoms worsen or if the condition fails to improve as anticipated.     Luetta Nutting, DO

## 2018-08-11 NOTE — Assessment & Plan Note (Signed)
-  Continue claritin and recommend addition of flonase daily.

## 2018-08-18 ENCOUNTER — Other Ambulatory Visit (INDEPENDENT_AMBULATORY_CARE_PROVIDER_SITE_OTHER): Payer: 59

## 2018-08-18 DIAGNOSIS — E119 Type 2 diabetes mellitus without complications: Secondary | ICD-10-CM | POA: Diagnosis not present

## 2018-08-18 DIAGNOSIS — E079 Disorder of thyroid, unspecified: Secondary | ICD-10-CM | POA: Diagnosis not present

## 2018-08-19 LAB — COMPREHENSIVE METABOLIC PANEL
AG Ratio: 1.2 (calc) (ref 1.0–2.5)
ALT: 16 U/L (ref 6–29)
AST: 15 U/L (ref 10–35)
Albumin: 4.1 g/dL (ref 3.6–5.1)
Alkaline phosphatase (APISO): 79 U/L (ref 37–153)
BUN: 11 mg/dL (ref 7–25)
CO2: 27 mmol/L (ref 20–32)
Calcium: 9.3 mg/dL (ref 8.6–10.4)
Chloride: 103 mmol/L (ref 98–110)
Creat: 0.78 mg/dL (ref 0.50–1.05)
Globulin: 3.3 g/dL (calc) (ref 1.9–3.7)
Glucose, Bld: 104 mg/dL — ABNORMAL HIGH (ref 65–99)
Potassium: 4 mmol/L (ref 3.5–5.3)
Sodium: 140 mmol/L (ref 135–146)
Total Bilirubin: 0.4 mg/dL (ref 0.2–1.2)
Total Protein: 7.4 g/dL (ref 6.1–8.1)

## 2018-08-19 LAB — HEMOGLOBIN A1C
Hgb A1c MFr Bld: 7.7 % of total Hgb — ABNORMAL HIGH (ref <5.7)
Mean Plasma Glucose: 174 (calc)
eAG (mmol/L): 9.7 (calc)

## 2018-08-19 LAB — LIPID PANEL
Cholesterol: 153 mg/dL (ref <200)
HDL: 45 mg/dL — ABNORMAL LOW (ref >=50)
LDL Cholesterol (Calc): 87 mg/dL (calc)
Non-HDL Cholesterol (Calc): 108 mg/dL (calc) (ref <130)
Total CHOL/HDL Ratio: 3.4 (calc) (ref <5.0)
Triglycerides: 114 mg/dL (ref <150)

## 2018-08-19 LAB — TSH: TSH: 2.19 mIU/L

## 2018-09-01 ENCOUNTER — Telehealth: Payer: Self-pay

## 2018-09-01 DIAGNOSIS — J302 Other seasonal allergic rhinitis: Secondary | ICD-10-CM

## 2018-09-01 MED ORDER — LORATADINE 10 MG PO TABS: 10.0000 mg | ORAL_TABLET | Freq: Every day | ORAL | 1 refills | Status: AC

## 2018-09-01 NOTE — Telephone Encounter (Signed)
Copied from CRM [id number]. Topic: Quick Communication - Rx Refill/Question >> Sep 01, 2018 11:39 AM Percival Spanish wrote: Medication loratadine (CLARITIN) 10 MG tablet   Preferred Pharmacy [address]  Agent: Please be advised that RX refills may take up to 3 business days. We ask that you follow-up with your pharmacy.

## 2018-09-01 NOTE — Addendum Note (Signed)
Addended by: Maryelizabeth Rowan A on: 09/01/2018 02:58 PM   Modules accepted: Orders

## 2018-09-15 ENCOUNTER — Telehealth: Payer: Self-pay | Admitting: Family Medicine

## 2018-09-15 NOTE — Telephone Encounter (Signed)
Called Pt . She was at work. She will MyChart message me for the exact one. She left the letter at home. Standby. Thanks.

## 2018-09-15 NOTE — Telephone Encounter (Signed)
Copied from [place] [id number]. Topic: Quick Communication - Rx Refill/Question >> Sep 15, 2018  1:26 PM Rayann Heman wrote: Medication: insulin detemir (LEVEMIR) 100 UNIT/ML injection [[id number]] effective July 1st insurance will no longer cover medication. Pt would like something else called in. Please advise

## 2018-09-15 NOTE — Telephone Encounter (Signed)
Does she know what her insurance will cover? Jean Berry or lantus?

## 2018-09-17 IMAGING — CR DG UGI W/ KUB
7 of 9 series · 12 of 18 positions shown · non-contrast
Comparison: None.

CLINICAL DATA: Report obesity.  Preop gastric sleeve.

EXAM:
UPPER GI SERIES WITH KUB
TECHNIQUE: After obtaining a scout radiograph a routine upper GI series was
performed using thin barium
FLUOROSCOPY TIME:  Fluoroscopy Time:  1 minutes 24 seconds
Radiation Exposure Index (if provided by the fluoroscopic device):
39.7 mGy
Number of Acquired Spot Images: 1

[t abdomen supine]
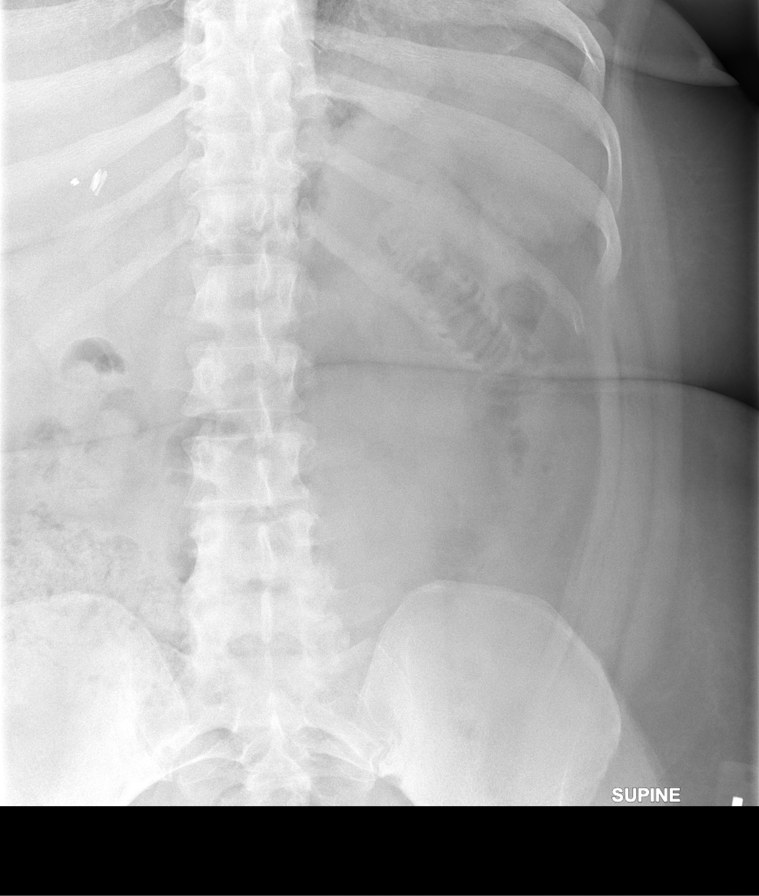

[Series 2: cp_standard · 0.34mm/px · 2 of 56 frames shown (1 of 6)]
[frame 21/56]
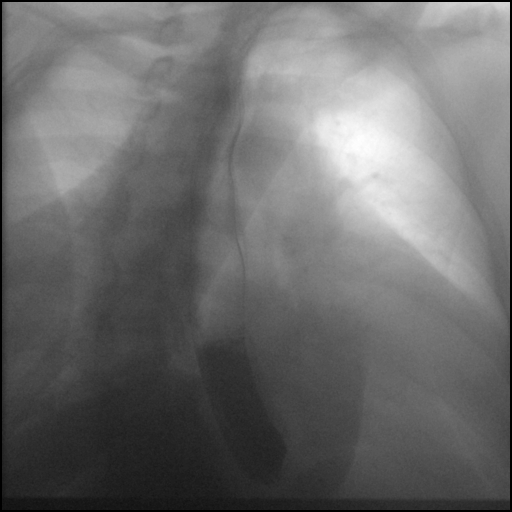
[frame 29/56]
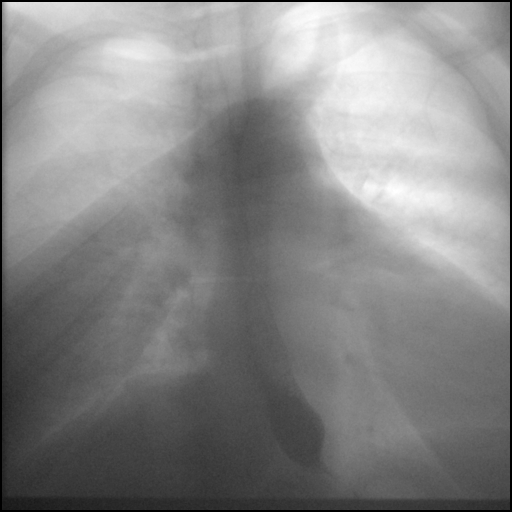

[Series 3: cp_standard · 0.34mm/px · 3 of 31 frames shown (2 of 6)]
[frame 5/31]
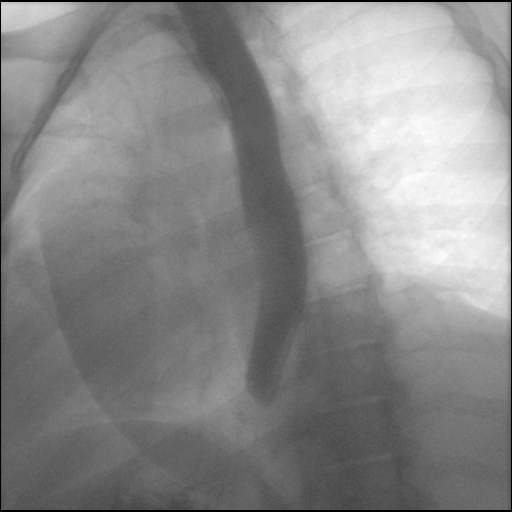
[frame 16/31]
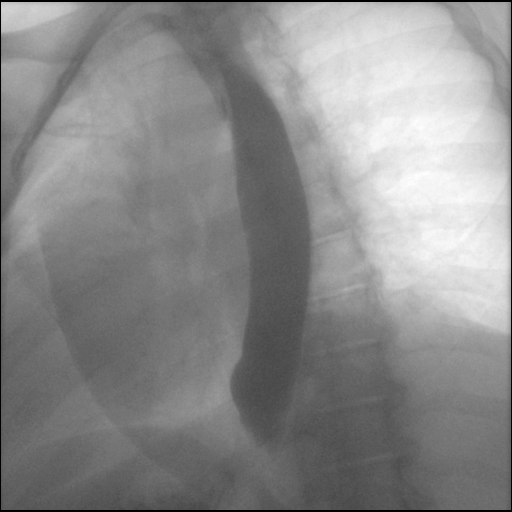
[frame 31/31]
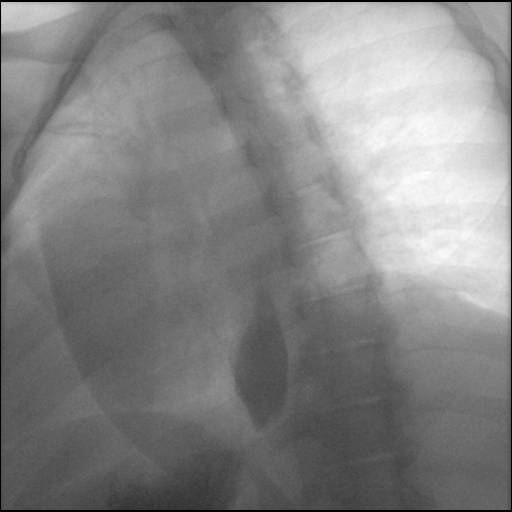

[Series 4: cp_standard · 0.34mm/px · 3 of 20 frames shown (3 of 6)]
[frame 4/20]
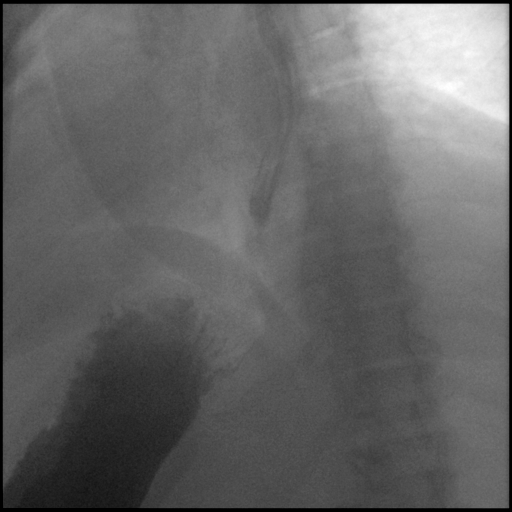
[frame 11/20]
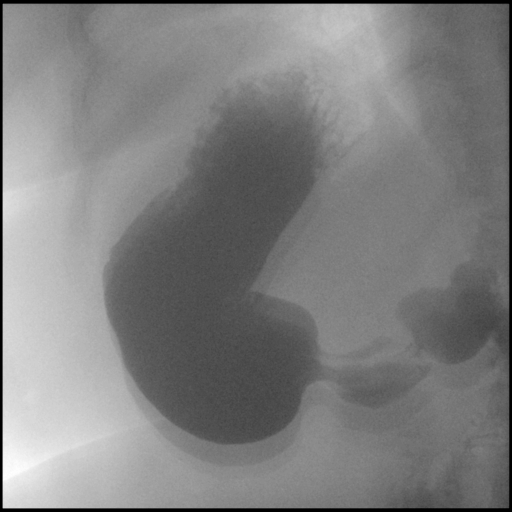
[frame 18/20]
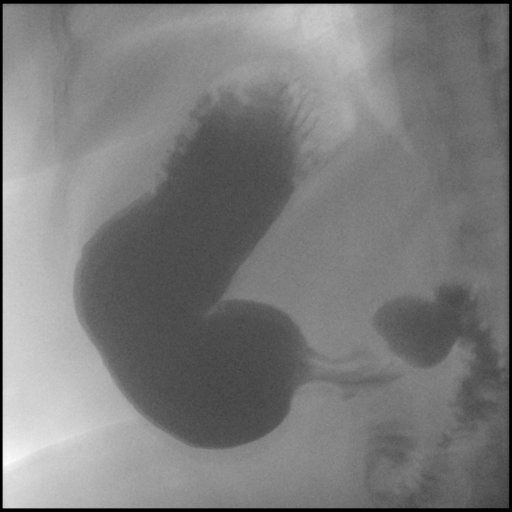

[cp_standard (4 of 6)]
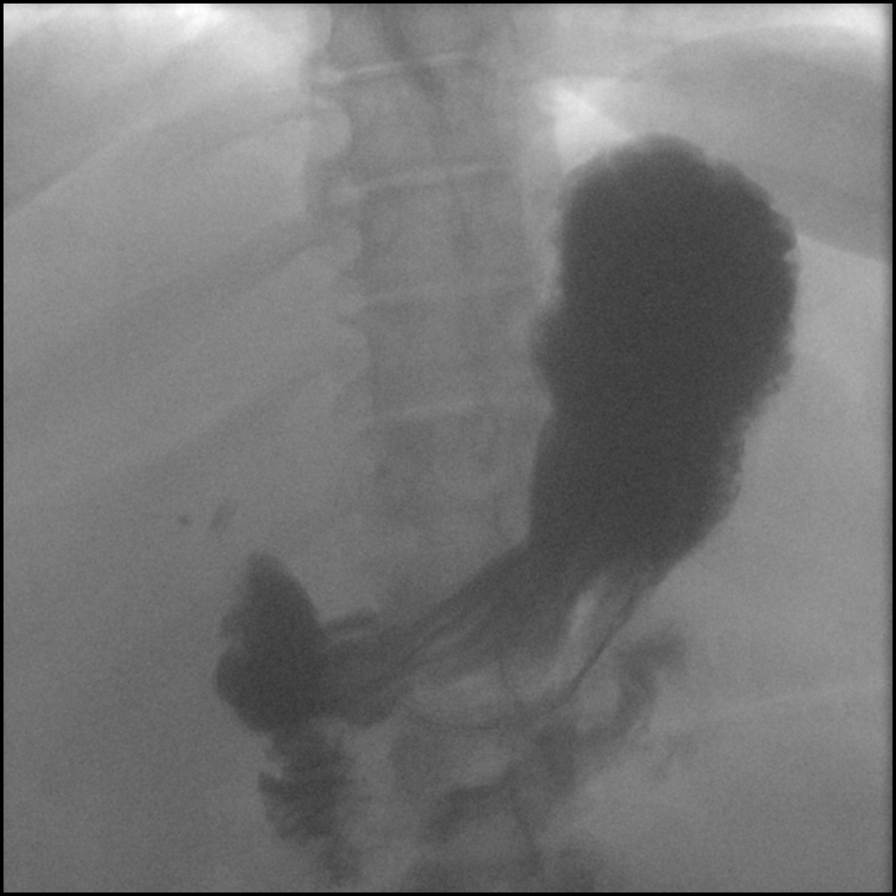

[cp_standard (5 of 6)]
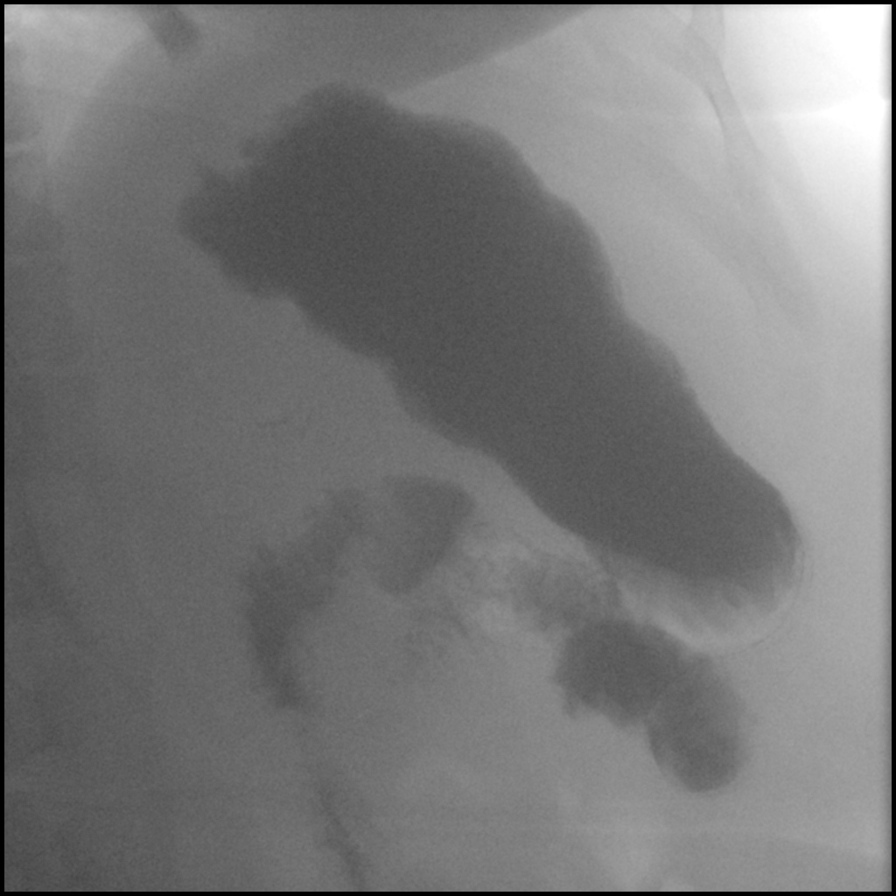

[cp_standard (6 of 6)]
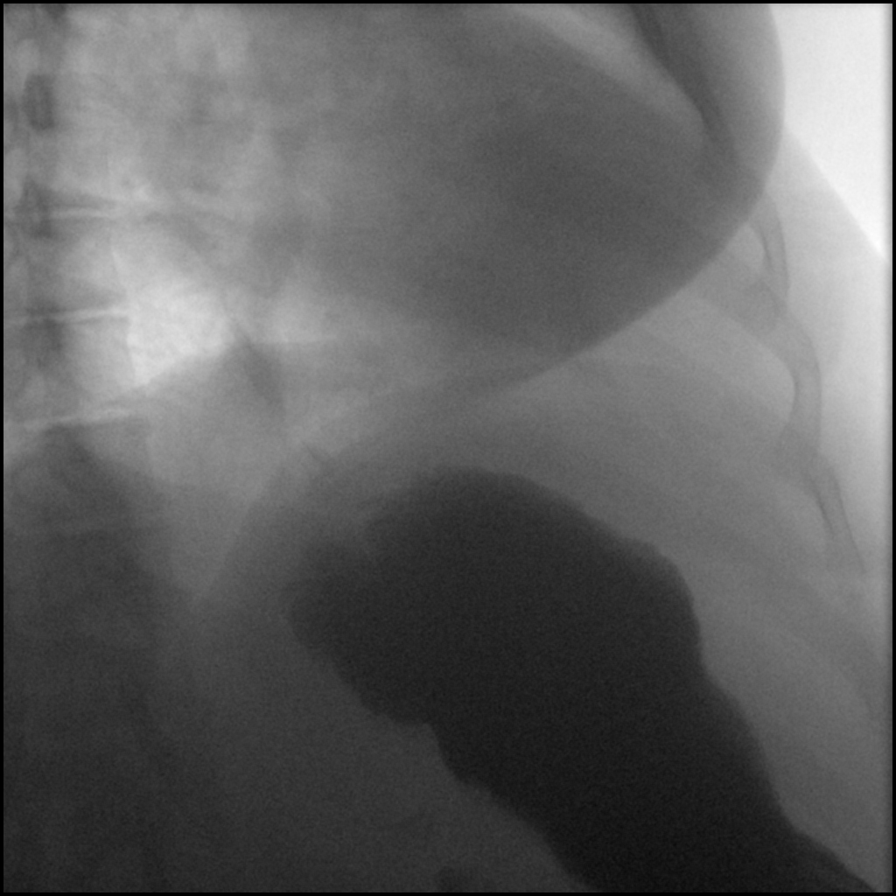

[12 of 18 positions shown; findings below may reference images not displayed]

FINDINGS: Scout film demonstrates changes of prior cholecystectomy.
Nonobstructive bowel gas pattern. No organomegaly.

Fluoroscopic evaluation of swallowing demonstrates normal esophageal
motility. No fixed stricture, fold thickening or mass. No reflux
with the water siphon maneuver.

Stomach, duodenal bulb and duodenal sweep are normal. No ulceration,
fold thickening or mass.
IMPRESSION: Unremarkable upper GI.

## 2018-09-18 NOTE — Telephone Encounter (Signed)
Sent MyChart message to gain information about which brand is covered by insurance. Standby.

## 2018-09-19 ENCOUNTER — Encounter: Payer: Self-pay | Admitting: Family Medicine

## 2018-09-20 ENCOUNTER — Other Ambulatory Visit: Payer: Self-pay | Admitting: Family Medicine

## 2018-09-20 ENCOUNTER — Telehealth: Payer: Self-pay | Admitting: Family Medicine

## 2018-09-20 ENCOUNTER — Encounter: Payer: Self-pay | Admitting: Family Medicine

## 2018-09-20 MED ORDER — LANTUS SOLOSTAR 100 UNIT/ML ~~LOC~~ SOPN: 30.0000 [IU] | PEN_INJECTOR | Freq: Every day | SUBCUTANEOUS | 1 refills | Status: DC

## 2018-09-20 MED ORDER — TRULICITY 1.5 MG/0.5ML ~~LOC~~ SOAJ: 1.5000 mg | SUBCUTANEOUS | 1 refills | Status: AC

## 2018-09-20 MED ORDER — BASAGLAR KWIKPEN 100 UNIT/ML ~~LOC~~ SOPN: 30.0000 [IU] | PEN_INJECTOR | Freq: Every day | SUBCUTANEOUS | 1 refills | Status: DC

## 2018-09-20 NOTE — Telephone Encounter (Signed)
Medication Refill - Medication: Insulin Glargine (LANTUS SOLOSTAR) 100 UNIT/ML Solostar Pen  PA is needed for the above medication but Pharmacist advised that [place] and [employer] are covered by insurance if provider wanted to just send one of those/ please advise   Has the patient contacted their pharmacy? Yes.   (Agent: If no, request that the patient contact the pharmacy for the refill.) (Agent: If yes, when and what did the pharmacy advise?)  Preferred Pharmacy (with phone number or street name):  [facility] - [place], [place] - [address] [phone] (Phone) [phone] (Fax)     Agent: Please be advised that RX refills may take up to 3 business days. We ask that you follow-up with your pharmacy.

## 2018-09-20 NOTE — Telephone Encounter (Signed)
Updated to basaglar, per patient lantus/toujeo covered under insurance.

## 2018-10-26 ENCOUNTER — Encounter: Payer: Self-pay | Admitting: Family Medicine

## 2018-11-13 ENCOUNTER — Other Ambulatory Visit: Payer: Self-pay

## 2018-11-13 MED ORDER — CONTOUR NEXT TEST VI STRP: ORAL_STRIP | 12 refills | Status: DC

## 2018-11-15 ENCOUNTER — Encounter: Payer: Self-pay | Admitting: Family Medicine

## 2018-11-16 MED ORDER — INSULIN LISPRO (1 UNIT DIAL) 100 UNIT/ML (KWIKPEN): PEN_INJECTOR | SUBCUTANEOUS | 4 refills | Status: DC

## 2018-11-16 NOTE — Telephone Encounter (Signed)
Yes, ok to refill 

## 2019-01-08 ENCOUNTER — Encounter: Payer: Self-pay | Admitting: Family Medicine

## 2019-01-09 ENCOUNTER — Other Ambulatory Visit: Payer: Self-pay | Admitting: Family Medicine

## 2019-01-09 MED ORDER — ONETOUCH VERIO FLEX SYSTEM W/DEVICE KIT: 1.0000 | PACK | Freq: Every day | 0 refills | Status: AC

## 2019-01-17 ENCOUNTER — Other Ambulatory Visit: Payer: Self-pay

## 2019-01-17 ENCOUNTER — Encounter: Payer: Self-pay | Admitting: Family Medicine

## 2019-01-17 ENCOUNTER — Other Ambulatory Visit: Payer: Self-pay | Admitting: Family Medicine

## 2019-01-17 MED ORDER — GLUCOSE BLOOD VI STRP: ORAL_STRIP | 12 refills | Status: AC

## 2019-01-17 MED ORDER — LANCETS MISC. MISC: 0 refills | Status: AC

## 2019-01-18 ENCOUNTER — Other Ambulatory Visit: Payer: Self-pay

## 2019-01-18 MED ORDER — LANTUS SOLOSTAR 100 UNIT/ML ~~LOC~~ SOPN: 30.0000 [IU] | PEN_INJECTOR | Freq: Every day | SUBCUTANEOUS | 1 refills | Status: DC

## 2019-02-15 IMAGING — DX DG CHEST 2V
2 series · 2 of 2 positions shown · non-contrast
Comparison: 07/25/2016

CLINICAL DATA: Anxiety attack.  History of tachycardia.

EXAM:
CHEST  2 VIEW

[chest pa]
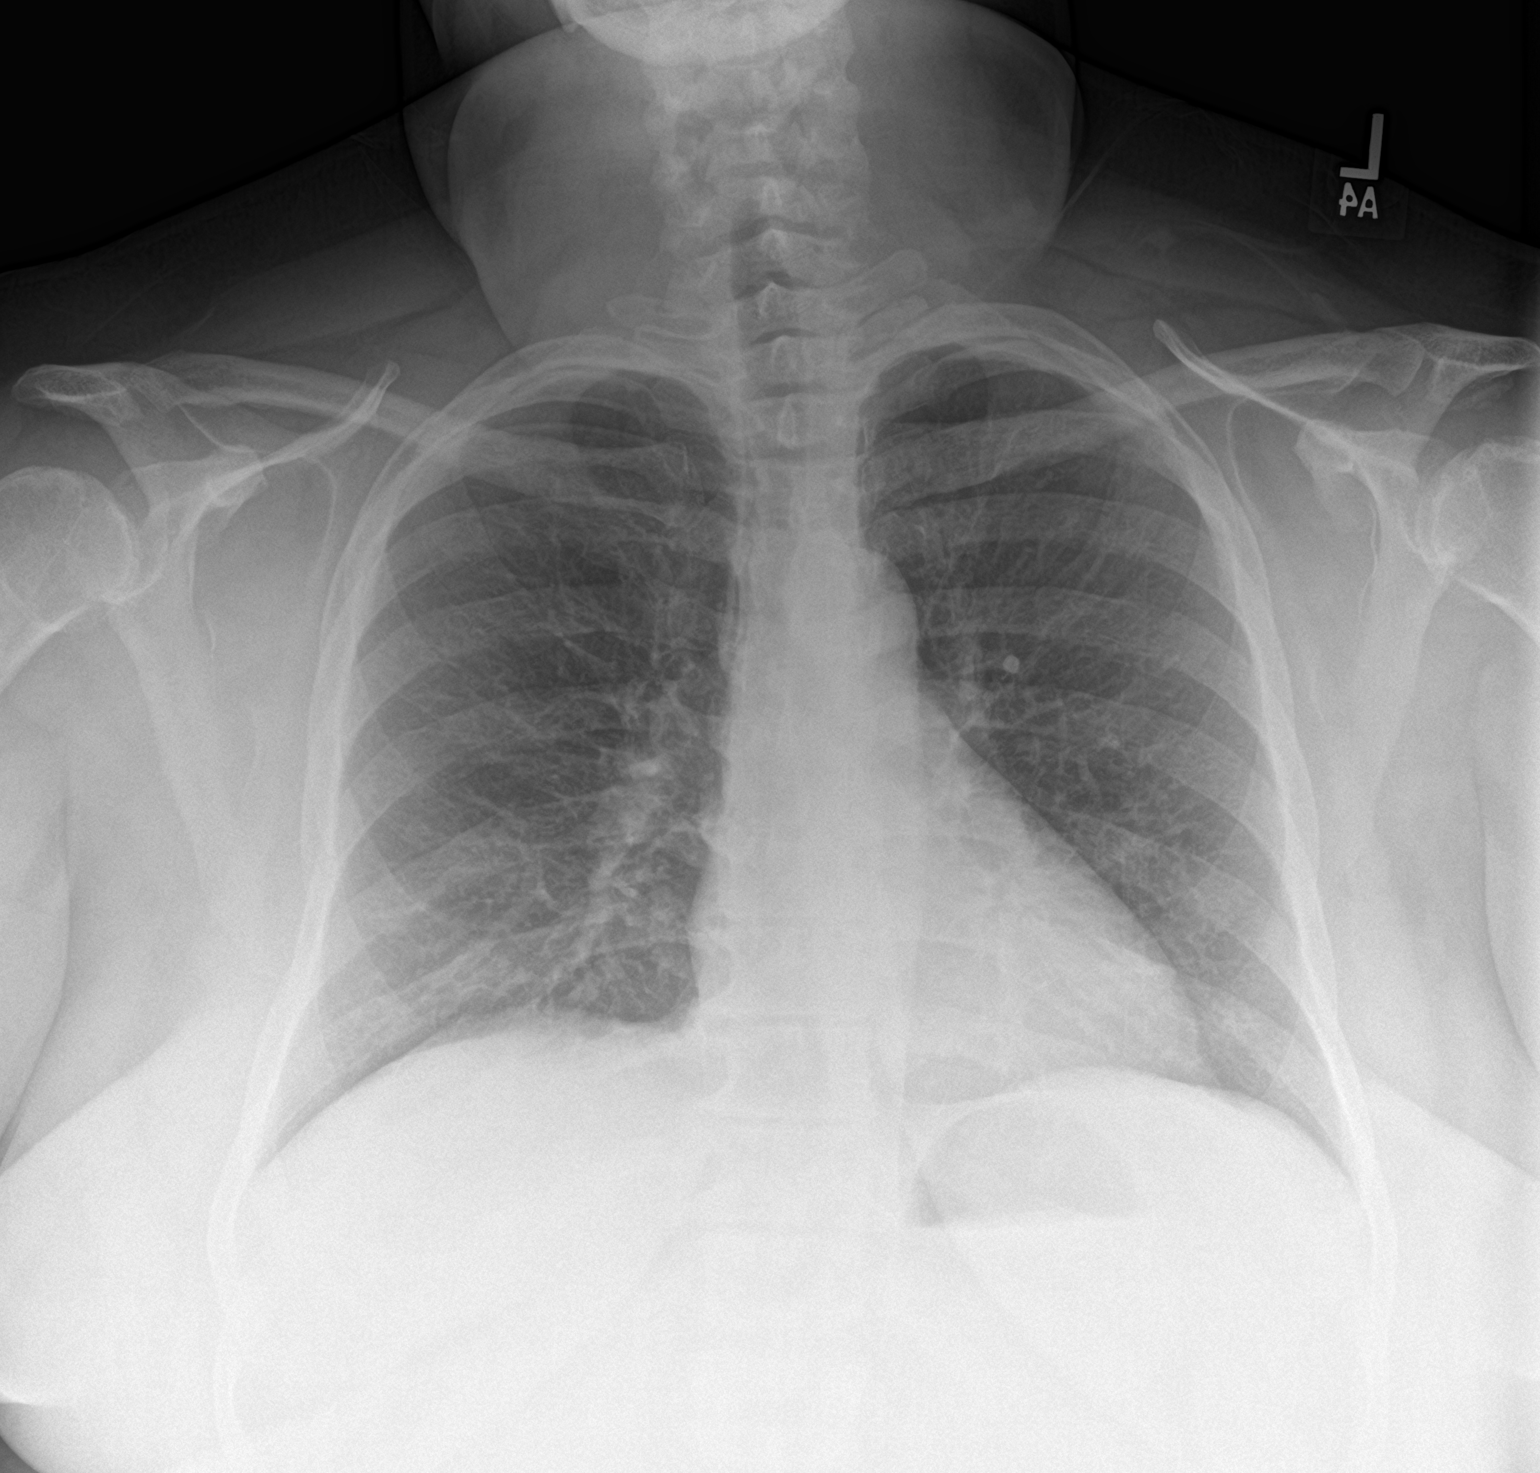

[chest lat]
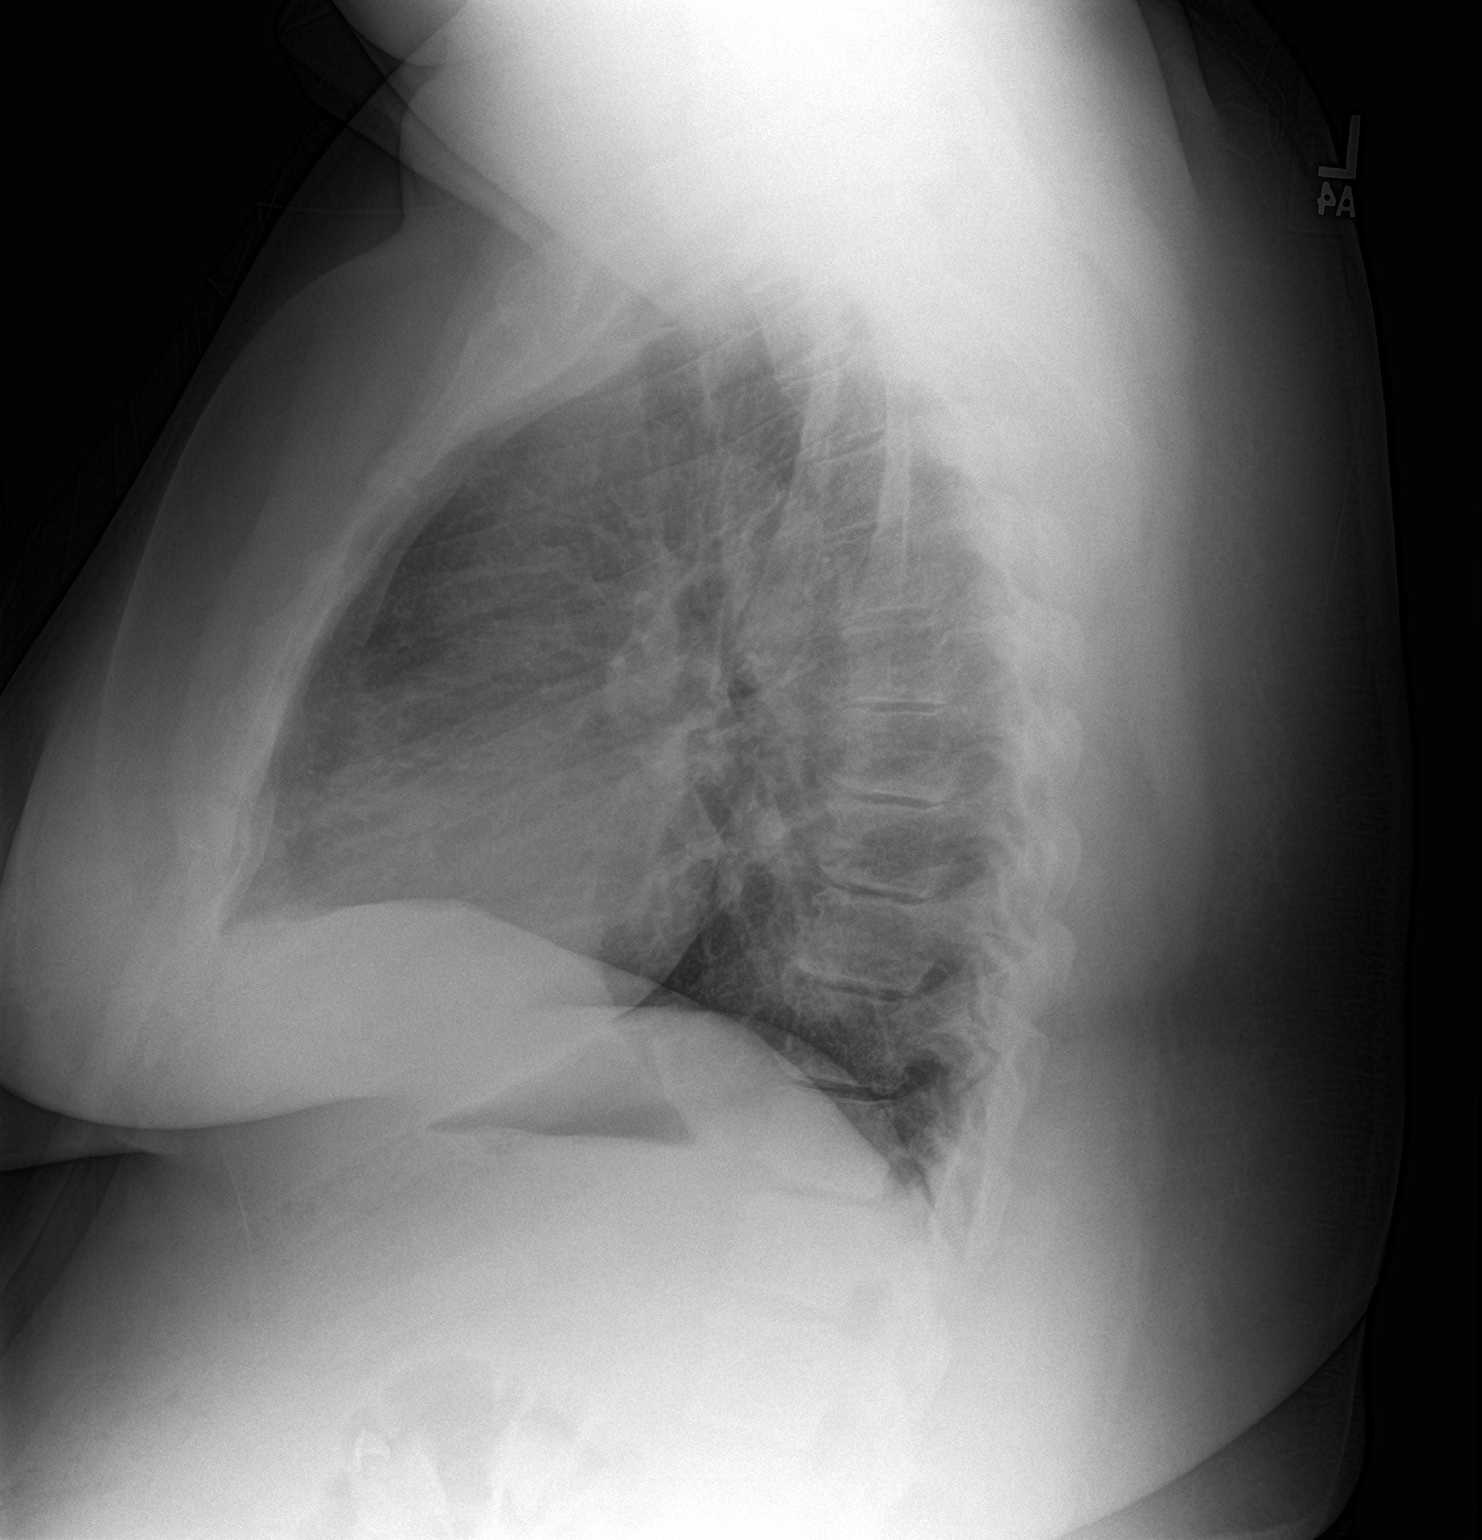

[2 of 2 positions shown; findings below may reference images not displayed]

FINDINGS: Grossly unchanged cardiac silhouette and mediastinal contours. No
focal airspace opacities. No pleural effusion or pneumothorax. No
evidence of edema. No acute osseus abnormalities. Stigmata of DISH
within the thoracic spine. Post cholecystectomy.
IMPRESSION: No acute cardiopulmonary disease.

## 2019-05-07 ENCOUNTER — Encounter: Payer: Self-pay | Admitting: Family Medicine

## 2019-05-08 MED ORDER — LANTUS SOLOSTAR 100 UNIT/ML ~~LOC~~ SOPN: 30.0000 [IU] | PEN_INJECTOR | Freq: Every day | SUBCUTANEOUS | 0 refills | Status: AC

## 2019-05-31 ENCOUNTER — Telehealth: Payer: Self-pay | Admitting: Family Medicine

## 2019-05-31 NOTE — Telephone Encounter (Signed)
Received a message for PA request from pharmacy.   PA for Lantus started, waiting for result.   Jean Berry (Key: [id number])

## 2019-06-04 NOTE — Telephone Encounter (Signed)
PA was started but was sent back saying it wasn't needed. I notified the pharmacy and they said that it may have been generated by error and that the patients prescription is being filled and pharmacy will call patient when ready.

## 2019-06-11 ENCOUNTER — Encounter: Payer: Self-pay | Admitting: Gastroenterology

## 2019-06-12 ENCOUNTER — Telehealth: Payer: Self-pay | Admitting: *Deleted

## 2019-06-12 ENCOUNTER — Other Ambulatory Visit: Payer: Self-pay

## 2019-06-12 DIAGNOSIS — Z1211 Encounter for screening for malignant neoplasm of colon: Secondary | ICD-10-CM

## 2019-06-12 NOTE — Telephone Encounter (Signed)
I spoke with pt, and she states her weight of 311 lb is better.  I told her that we will have to do her colonoscopy at [facility] on 07-30-19.  Understanding voiced and pt states she will have to check her son's schedule to make sure he can drive her. I told her to call us back and let us know- we will move her PV to closer to her appt.  Colonoscopy at [facility] cancelled

## 2019-06-12 NOTE — Telephone Encounter (Signed)
Beth,  We will weight the patient when they get here.  Could you set up a hospital appointment just in case the BMI is greater than 50 still?  Thanks, [employer]

## 2019-06-12 NOTE — Telephone Encounter (Signed)
Dr. Lavon Paganini,  This pt is coming in for a PV on 06-20-19 for a screening colonoscopy on 07-13-19.   On 06-08-19, her BMI was 51.1.  If she is still above that when we weigh her at her PV, is she ok for a direct hospital appt or would you like an OV first?  Thanks, Baxter Hire

## 2019-06-12 NOTE — Telephone Encounter (Signed)
Yes, ok to schedule as direct procedure for [facility] endo, next available appt. Thanks

## 2019-06-12 NOTE — Telephone Encounter (Signed)
Noted.  Will weigh patient when they have their PV to determine if BMI is greater than 50.

## 2019-06-12 NOTE — Telephone Encounter (Signed)
Thank you for the heads up. Hospital colonoscopy is scheduled for 07/30/19 at begin at 9:30 am.

## 2019-06-13 NOTE — Telephone Encounter (Signed)
Correction of note from 06-12-19.  When I spoke with pt, she states her weight of 311 lbs is accurate

## 2019-06-20 ENCOUNTER — Telehealth: Payer: Self-pay | Admitting: *Deleted

## 2019-06-20 NOTE — Telephone Encounter (Signed)
Called [facility] [employer]. Colonoscopy cancelled per patient request.

## 2019-06-20 NOTE — Telephone Encounter (Signed)
Pls cancel pt's procedure at [facility] on 4/26. Pt states that she prefers to wait. She will call back to r/s. Thank you.

## 2019-06-20 NOTE — Telephone Encounter (Signed)
Patient canceled pre-visit, called to confirm, she states she called this AM and canceled procedure for [facility] and will call at a later date to reschedule.  Arlyss Queen, RN to notify [facility] of cancellation.

## 2019-07-13 ENCOUNTER — Encounter: Payer: 59 | Admitting: Gastroenterology

## 2019-07-22 IMAGING — US US SOFT TISSUE HEAD/NECK
1 series · 14 of 18 positions shown · non-contrast
Comparison: None.

CLINICAL DATA: Palpable abnormality at the posterior lower midline
neck

EXAM:
ULTRASOUND OF HEAD/NECK SOFT TISSUES
TECHNIQUE: Ultrasound examination of the head and neck soft tissues was
performed in the area of clinical concern.

[Series 1: us soft tissue head/neck · 0.07mm/px · 14 of 18 slices shown]
[im 1/18]
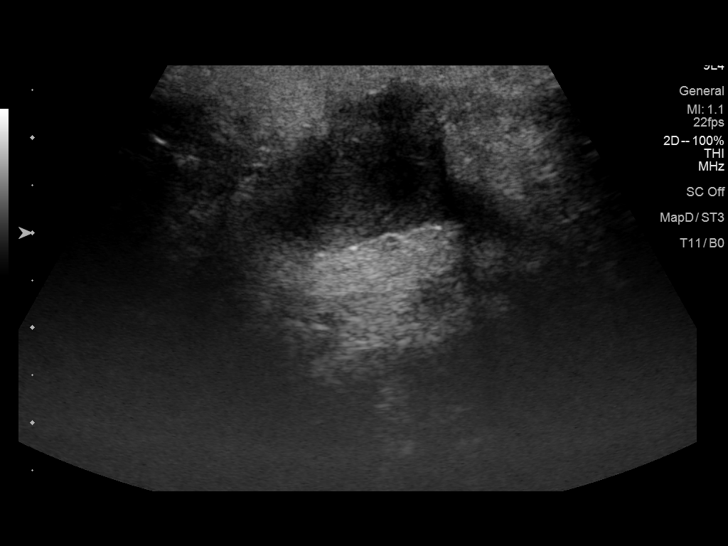
[im 2/18]
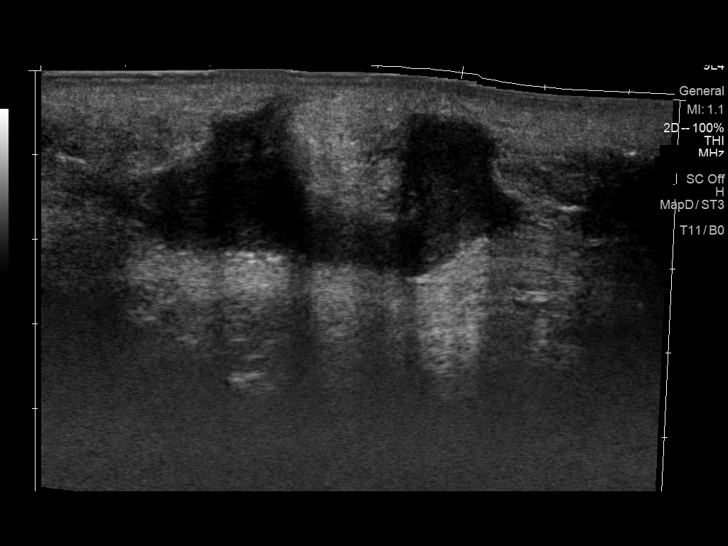
[im 4/18]
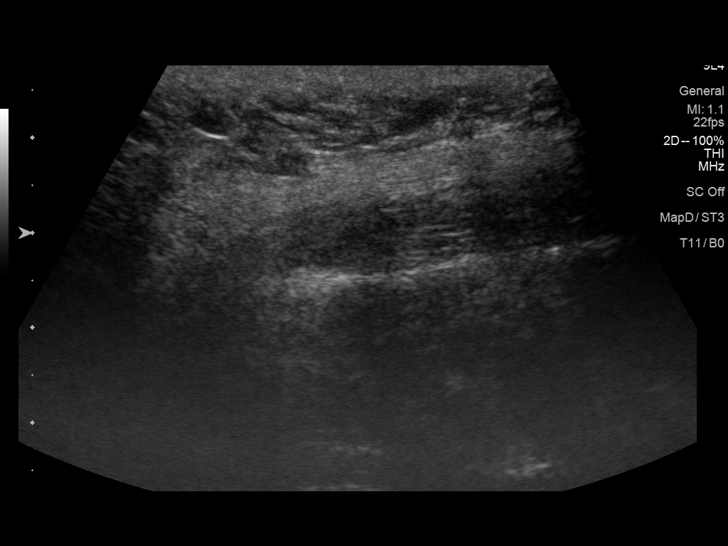
[im 5/18]
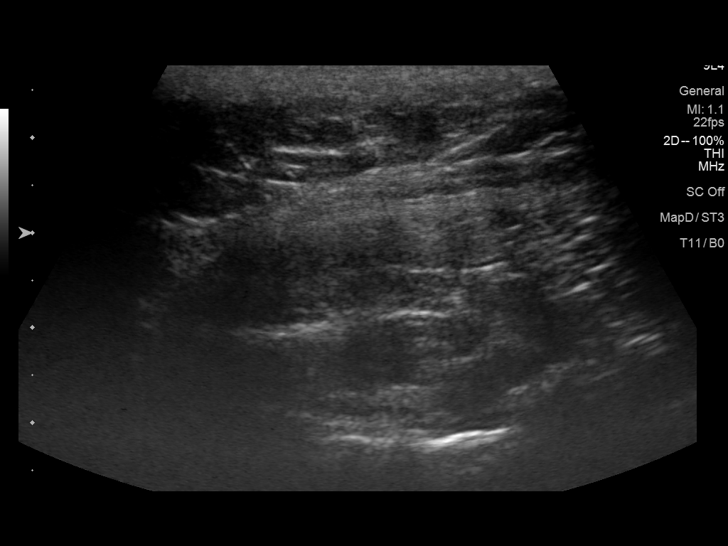
[im 6/18]
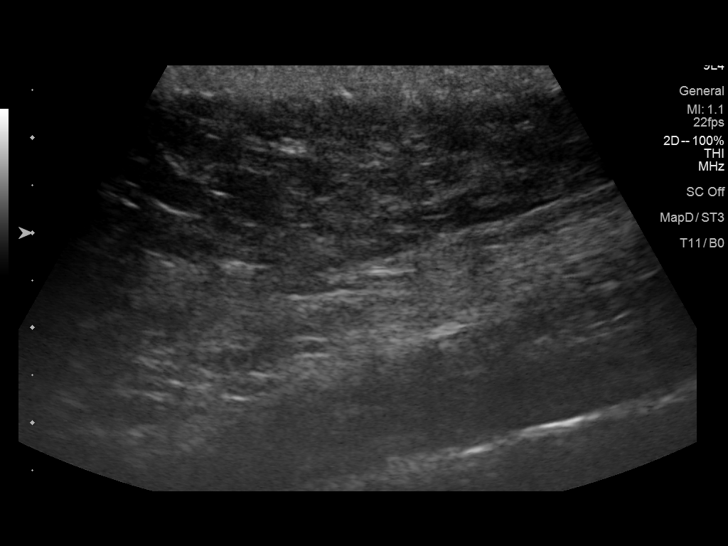
[im 8/18]
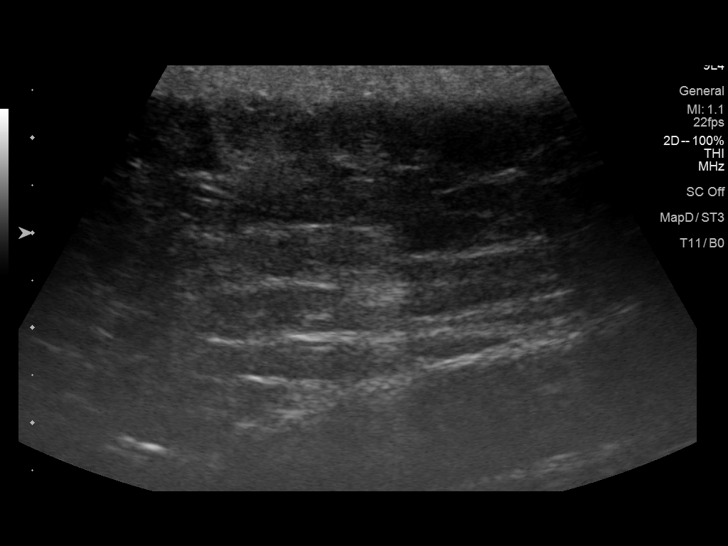
[im 9/18]
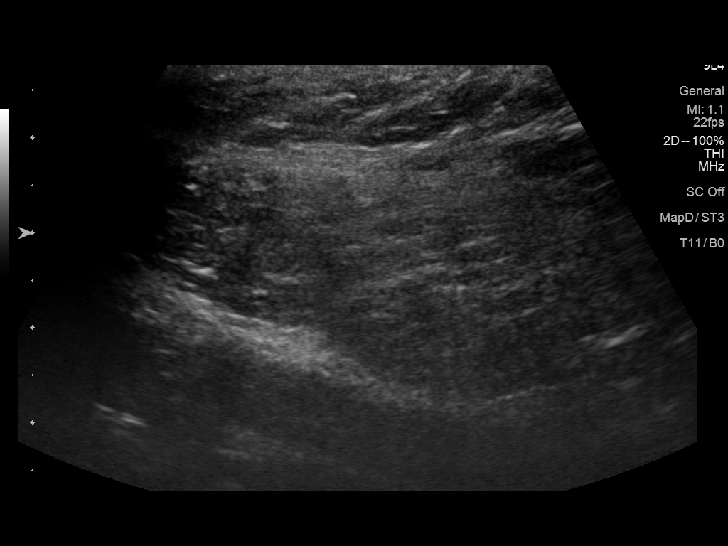
[im 10/18]
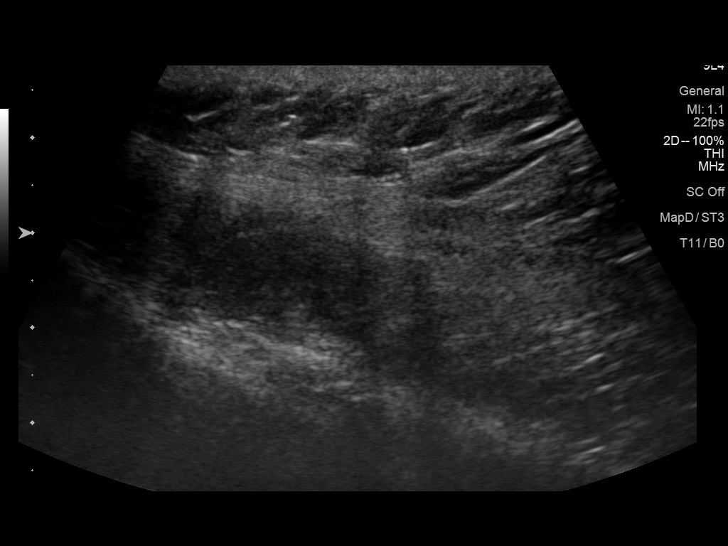
[im 11/18]
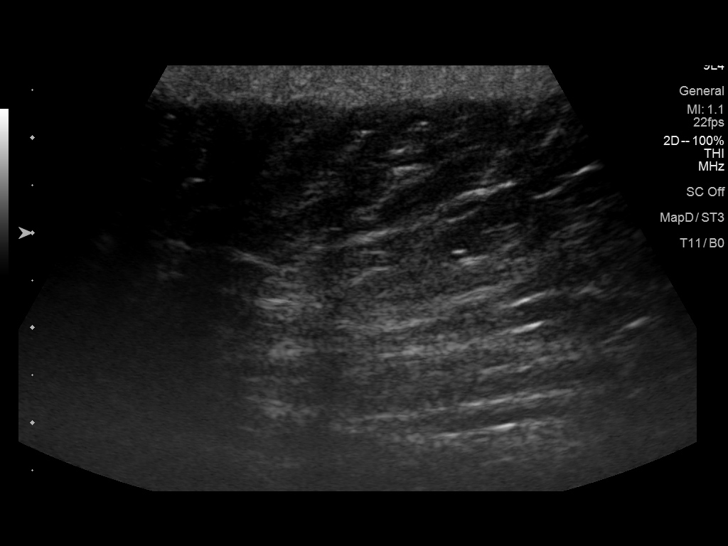
[im 13/18]
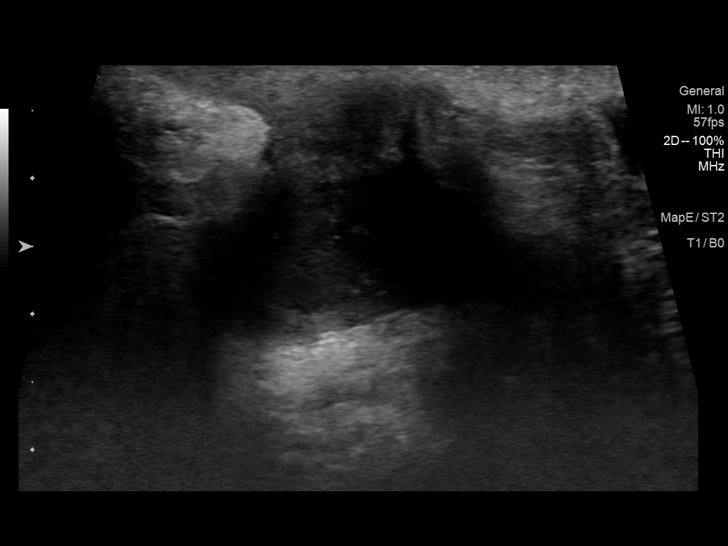
[im 14/18]
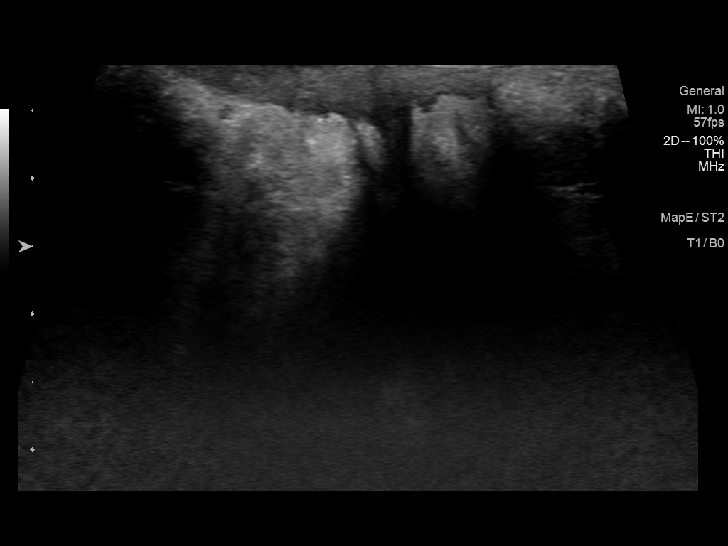
[im 15/18]
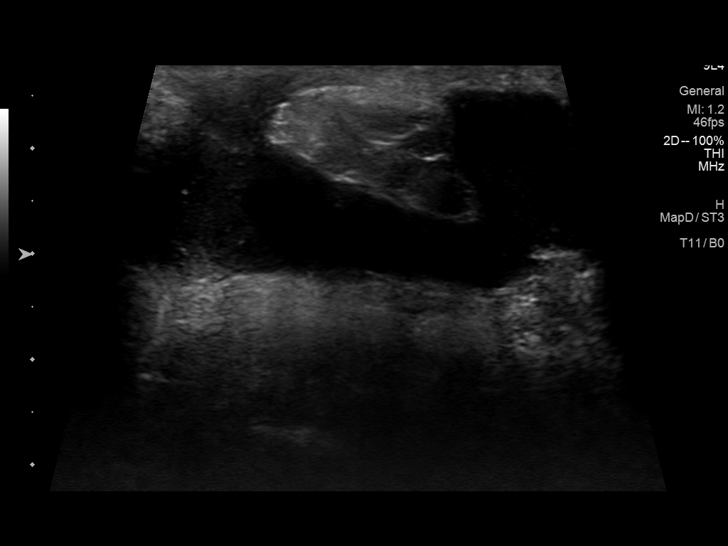
[im 17/18]
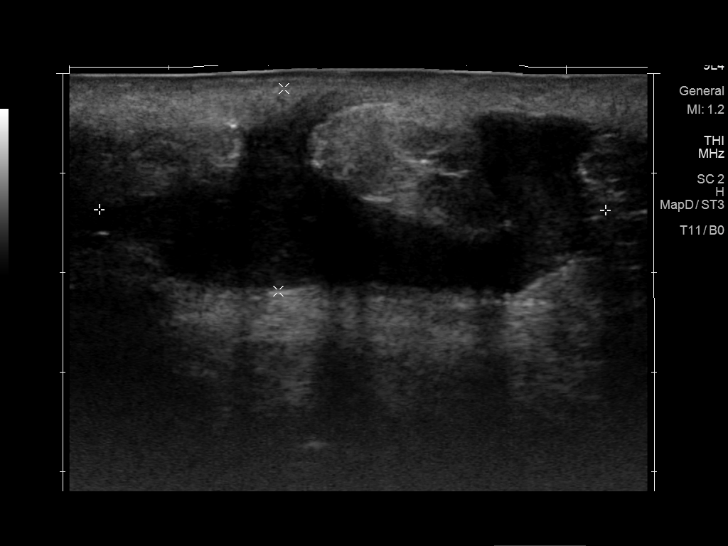
[im 18/18]
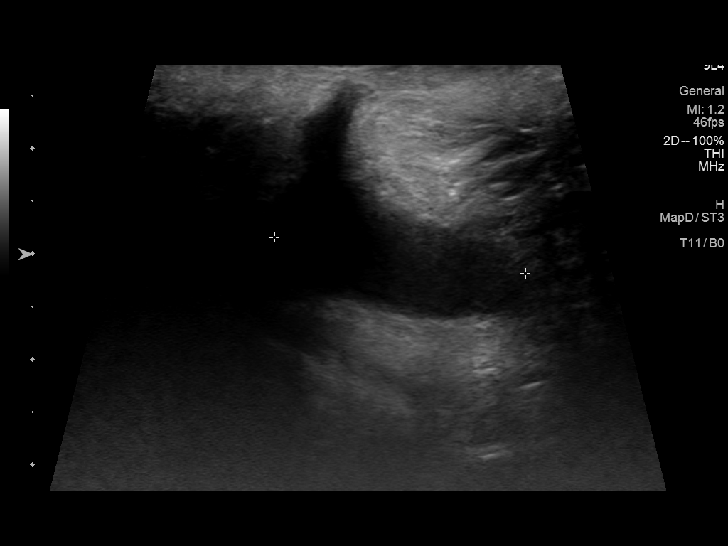

[14 of 18 positions shown; findings below may reference images not displayed]

FINDINGS: The palpable abnormality corresponds to an ill-defined fluid
collection with some internal hypoechoic signal within the
subcutaneous fat measuring 5.1 x 2.0 x 2.4 cm.
IMPRESSION: Palpable abnormality corresponds to a subcutaneous ill-defined
complex fluid collection measuring up to 5.1 cm. Abscess is not
excluded. CT neck with contrast can be performed to further
delineate as clinically indicated.

## 2019-07-28 ENCOUNTER — Other Ambulatory Visit: Payer: Self-pay | Admitting: Family Medicine

## 2019-07-30 ENCOUNTER — Encounter (HOSPITAL_COMMUNITY): Payer: Self-pay

## 2019-07-30 ENCOUNTER — Ambulatory Visit (HOSPITAL_COMMUNITY): Admit: 2019-07-30 | Payer: 59 | Admitting: Gastroenterology

## 2019-07-30 SURGERY — COLONOSCOPY WITH PROPOFOL
Anesthesia: Monitor Anesthesia Care

## 2019-08-25 ENCOUNTER — Other Ambulatory Visit: Payer: Self-pay | Admitting: Family Medicine

## 2020-01-31 ENCOUNTER — Other Ambulatory Visit (HOSPITAL_BASED_OUTPATIENT_CLINIC_OR_DEPARTMENT_OTHER): Payer: Self-pay

## 2020-01-31 DIAGNOSIS — G4733 Obstructive sleep apnea (adult) (pediatric): Secondary | ICD-10-CM

## 2020-02-07 ENCOUNTER — Other Ambulatory Visit: Payer: Self-pay | Admitting: Family Medicine

## 2020-03-07 ENCOUNTER — Ambulatory Visit (HOSPITAL_BASED_OUTPATIENT_CLINIC_OR_DEPARTMENT_OTHER): Payer: 59 | Attending: Psychiatry | Admitting: Internal Medicine

## 2020-03-07 ENCOUNTER — Other Ambulatory Visit: Payer: Self-pay

## 2020-03-07 VITALS — Ht 66.0 in | Wt 298.0 lb

## 2020-03-07 DIAGNOSIS — Z6841 Body Mass Index (BMI) 40.0 and over, adult: Secondary | ICD-10-CM | POA: Diagnosis not present

## 2020-03-07 DIAGNOSIS — Z7984 Long term (current) use of oral hypoglycemic drugs: Secondary | ICD-10-CM | POA: Diagnosis not present

## 2020-03-07 DIAGNOSIS — E119 Type 2 diabetes mellitus without complications: Secondary | ICD-10-CM | POA: Insufficient documentation

## 2020-03-07 DIAGNOSIS — G4733 Obstructive sleep apnea (adult) (pediatric): Secondary | ICD-10-CM

## 2020-03-07 DIAGNOSIS — Z79899 Other long term (current) drug therapy: Secondary | ICD-10-CM | POA: Insufficient documentation

## 2020-03-08 ENCOUNTER — Encounter (HOSPITAL_BASED_OUTPATIENT_CLINIC_OR_DEPARTMENT_OTHER): Payer: 59 | Admitting: Internal Medicine

## 2020-03-16 DIAGNOSIS — G4733 Obstructive sleep apnea (adult) (pediatric): Secondary | ICD-10-CM

## 2020-03-16 NOTE — Procedures (Signed)
Patient Name: Rhythm, Gubbels Date: 03/07/2020 Gender: Female D.O.B: [birth year] Age (years): 40 Referring Provider: Stacy Gardner MD Height (inches): 66 Interpreting Physician: Jetty Duhamel MD, ABSM Weight (lbs): 298 RPSGT: Lowry Ram BMI: 48 MRN: [id number] Neck Size: 17.00  CLINICAL INFORMATION Sleep Study Type: NPSG Indication for sleep study: Diabetes, Fatigue, Morbid Obesity, Morning Headaches, Non-refreshing Sleep, Snoring, Witnesses Apnea / Gasping During Sleep Epworth Sleepiness Score: 1  Most recent polysomnogram dated 05/27/2017 revealed an AHI of 19.7/h and RDI of 22.8/h. Most recent titration study dated 07/08/2017 was optimal at 12cm H2O with an AHI of 1.1/h. SLEEP STUDY TECHNIQUE As per the AASM Manual for the Scoring of Sleep and Associated Events v2.3 (April 2016) with a hypopnea requiring 4% desaturations.  The channels recorded and monitored were frontal, central and occipital EEG, electrooculogram (EOG), submentalis EMG (chin), nasal and oral airflow, thoracic and abdominal wall motion, anterior tibialis EMG, snore microphone, electrocardiogram, and pulse oximetry.  MEDICATIONS Medications self-administered by patient taken the night of the study : METFORMIN, METOPROLOL  SLEEP ARCHITECTURE The study was initiated at 10:40:58 and ended at  5:04:55.  Sleep onset time was 11.2 minutes and the sleep efficiency was 85.6%. The total sleep time was 328.7 minutes.  Stage REM latency was 5.5 minutes.  The patient spent 3.7% of the night in stage N1 sleep, 85.7% in stage N2 sleep, 0.0% in stage N3 and 10.6% in REM.  Alpha intrusion was absent.  Supine sleep was 5.55%.  RESPIRATORY PARAMETERS The overall apnea/hypopnea index (AHI) was 8.6 per hour. There were 5 total apneas, including 5 obstructive, 5 central and 5 mixed apneas. There were 5 hypopneas and 5 RERAs.  The AHI during Stage REM sleep was 27.4 per hour.  AHI while supine was 5.5 per  hour.  The mean oxygen saturation was 94.6%. The minimum SpO2 during sleep was 79.0%.  moderate snoring was noted during this study.  CARDIAC DATA The 2 lead EKG demonstrated sinus rhythm. The mean heart rate was 76.0 beats per minute. Other EKG findings include: PVCs.  LEG MOVEMENT DATA The total PLMS were 0 with a resulting PLMS index of 0.0.  IMPRESSIONS - Mild obstructive sleep apnea occurred during this study (AHI = 8.6/h). - No central sleep apnea occurred during this study . - The patient had minimal oxygen desaturation during the study (Min O2 = 79.0%) Mean O2 sat 94.6%. - The patient snored with moderate snoring volume. - EKG findings include PVCs. - No periodic limb movements of sleep occurred during the study.  DIAGNOSIS - Obstructive Sleep Apnea (G47.33  RECOMMENDATIONS - Treatment for mild OSA is directed at symptoms. Conservative measures may include observation, weight loss and sleep position off back. Other options, including CPAP titration, a fitted oral appliance, or ENT evaluation, would be based on clinical judgment. - Be careful with alcohol, sedatives and other CNS depressants that may worsen sleep apnea and disrupt normal sleep architecture. - Sleep hygiene should be reviewed to assess factors that may improve sleep quality. - Weight management and regular exercise should be initiated or continued if appropriate.  [Electronically signed] 03/16/2020 11:43 AM  Jetty Duhamel MD, ABSM Diplomate, American Board of Sleep Medicine   NPI: [id number]                        Jetty Duhamel Diplomate, American Board of Sleep Medicine  ELECTRONICALLY SIGNED ON:  03/16/2020, 11:44 AM [facility] PH: [phone]  FX: [phone] [place]

## 2020-03-27 LAB — COLOGUARD: COLOGUARD: POSITIVE — AB

## 2020-03-27 LAB — EXTERNAL GENERIC LAB PROCEDURE: COLOGUARD: POSITIVE — AB

## 2020-04-16 ENCOUNTER — Encounter: Payer: Self-pay | Admitting: Gastroenterology

## 2020-05-01 ENCOUNTER — Encounter: Payer: Self-pay | Admitting: Gastroenterology

## 2020-05-01 ENCOUNTER — Other Ambulatory Visit: Payer: Self-pay

## 2020-05-01 ENCOUNTER — Ambulatory Visit (INDEPENDENT_AMBULATORY_CARE_PROVIDER_SITE_OTHER): Payer: 59 | Admitting: Gastroenterology

## 2020-05-01 VITALS — BP 124/70 | HR 89 | Ht 66.0 in | Wt 299.0 lb

## 2020-05-01 DIAGNOSIS — K219 Gastro-esophageal reflux disease without esophagitis: Secondary | ICD-10-CM

## 2020-05-01 DIAGNOSIS — Z794 Long term (current) use of insulin: Secondary | ICD-10-CM

## 2020-05-01 DIAGNOSIS — E119 Type 2 diabetes mellitus without complications: Secondary | ICD-10-CM | POA: Diagnosis not present

## 2020-05-01 DIAGNOSIS — R195 Other fecal abnormalities: Secondary | ICD-10-CM

## 2020-05-01 DIAGNOSIS — R12 Heartburn: Secondary | ICD-10-CM

## 2020-05-01 MED ORDER — PLENVU 140 G PO SOLR: ORAL | 0 refills | Status: DC

## 2020-05-01 NOTE — Progress Notes (Signed)
05/01/2020 Jean Berry [id number] Jul 08, 1965   HISTORY OF PRESENT ILLNESS:  This is a pleasant 55 year old female who is new to our office.  She presents here today at the request of Dr. Tildon Husky. Jean Berry in order to discuss colonoscopy for positive Cologuard.  She tells me that her last colonoscopy was in 2012 in [place], [place].  She said that she did not need another one for 10 years.  Her PCP recently ordered a Cologuard stool study and it came back positive.  She says that she has some underlying constipation, but no other lower GI issues.  She denies seeing blood in her stool.  She does report a lot of indigestion/heartburn/acid reflux with associated belching.  She is on pantoprazole 40 mg daily, but has symptoms despite that.  She denies any dysphagia.  Uses Pepcid on occasion as needed, but she reports that usually Tums are her go to and they seem to help much better than the Pepcid.  She is diabetic on insulin.  Last Hgb A1C 6.1.   Past Medical History:  Diagnosis Date  . Arthritis   . Diabetes mellitus without complication ([place])   . Gout flare   . Lower extremity edema 11/18/2016  . Osteoarthritis of back   . Palpitations   . Shortness of breath 11/18/2016  . Thyroid disease    left lobect approx 2012--benign per pt   Past Surgical History:  Procedure Laterality Date  . COLONOSCOPY    . GALLBLADDER SURGERY    . IRRIGATION AND DEBRIDEMENT ABSCESS N/A 01/08/2017   Procedure: IRRIGATION AND DEBRIDEMENT ABSCESS POSTERIOR NECK;  Surgeon: Ileana Roup, MD;  Location: [facility];  Service: General;  Laterality: N/A;  . IRRIGATION AND DEBRIDEMENT ABSCESS N/A 10/28/2017   Procedure: IRRIGATION AND DEBRIDEMENT OF NECK ABSCESS;  Surgeon: Excell Seltzer, MD;  Location: [facility];  Service: General;  Laterality: N/A;  . OVARIAN CYST SURGERY Right   . THYROID SURGERY Left   . TONSILECTOMY, ADENOIDECTOMY, BILATERAL MYRINGOTOMY AND TUBES    . TUBAL LIGATION    .  UPPER GASTROINTESTINAL ENDOSCOPY      reports that she has never smoked. She has never used smokeless tobacco. She reports that she does not drink alcohol and does not use drugs. family history includes Diabetes in her maternal grandmother; Heart attack in her maternal grandmother; Heart disease in her mother; Lung cancer in her mother. Allergies  Allergen Reactions  . Bee Venom Anaphylaxis  . Latex Rash      Outpatient Encounter Medications as of 05/01/2020  Medication Sig  . acetaminophen (TYLENOL) 500 MG tablet Take 1,000 mg by mouth every 6 (six) hours as needed for headache.  . Blood Glucose Monitoring Suppl ([place]) w/Device KIT 1 Device by Does not apply route daily. Please dispense appropriate strips and lancets for device to check glucose up to TID.  Marland Kitchen cyclobenzaprine (FLEXERIL) 10 MG tablet Take 1 tablet (10 mg total) by mouth 3 (three) times daily as needed for muscle spasms.  Marland Kitchen glucose blood test strip Onetouch verio flex meter. Check blood glucose TID.  Marland Kitchen Insulin Glargine (BASAGLAR KWIKPEN) 100 UNIT/ML SOPN Inject 0.3 mLs (30 Units total) into the skin daily.  . Insulin Glargine (LANTUS SOLOSTAR) 100 UNIT/ML Solostar Pen Inject 30 Units into the skin daily.  . insulin lispro (HUMALOG KWIKPEN) 100 UNIT/ML KwikPen INJECT UP TO 14 UNITS UNDER THE SKIN THREE TIMES DAILY BASED ON SLIDING SCALE  . Insulin Pen  Needle (PEN NEEDLES) 31G X 6 MM MISC Use to inject insulin up to TID  . Lancets Misc. MISC Onetouch verio flex. Check blood glucose TID.  Marland Kitchen loratadine (CLARITIN) 10 MG tablet Take 1 tablet (10 mg total) by mouth daily.  . meloxicam (MOBIC) 15 MG tablet   . meloxicam (MOBIC) 7.5 MG tablet Take 1 tablet (7.5 mg total) by mouth daily.  . metoprolol tartrate (LOPRESSOR) 25 MG tablet Take 1 tablet (25 mg total) by mouth 2 (two) times daily.  . Multiple Vitamin (MULTIVITAMIN WITH MINERALS) TABS tablet Take 1 tablet by mouth daily.  . pantoprazole (PROTONIX) 40 MG  tablet Take 1 tablet (40 mg total) by mouth daily.  . TRULICITY 1.5 EZ/6.6QH SOPN INJECT 1.5MG UNDER THE SKIN ONCE A WEEK   No facility-administered encounter medications on file as of 05/01/2020.    REVIEW OF SYSTEMS  : All other systems reviewed and negative except where noted in the History of Present Illness.  PHYSICAL EXAM: BP 124/70   Pulse 89   Ht 5' 6"  (1.676 m)   Wt 299 lb (135.6 kg)   BMI 48.26 kg/m  General: Well developed AA female in no acute distress Head: Normocephalic and atraumatic Eyes:  Sclerae anicteric, conjunctiva pink. Ears: Normal auditory acuity Lungs: Clear throughout to auscultation; no W/R/R. Heart: Regular rate and rhythm; no M/R/G. Abdomen: Soft, non-distended.  BS present.  Non-tender. Rectal:  Will be done at the time of colonoscopy. Musculoskeletal: Symmetrical with no gross deformities  Skin: No lesions on visible extremities Extremities: No edema  Neurological: Alert oriented x 4, grossly non-focal Psychological:  Alert and cooperative. Normal mood and affect  ASSESSMENT AND PLAN: *Positive cologuard:  Last colonoscopy 2012 in [place].  Now positive cologuard.  No lower GI symptoms.  Will schedule for colonoscopy with Dr. Silverio Decamp. *GERD/heartburn:  Symptoms despite pantoprazole 40 mg daily.  Uses pepcid prn as well.  Will plan for EGD with Dr. Silverio Decamp also.  May need to increase to BID for change PPI depending EGD results, etc. *IDDM:  Insulin will be adjusted prior to endoscopic procedure per protocol. Will resume normal dosing after procedure.  **The risks, benefits, and alternatives to EGD and colonoscopy were discussed with the patient and she consents to proceed.   CC:  Luetta Nutting, DO  CC:  Dr. Jannifer Berry

## 2020-05-01 NOTE — Patient Instructions (Signed)
If you are age 55 or older, your body mass index should be between 23-30. Your Body mass index is 48.26 kg/m. If this is out of the aforementioned range listed, please consider follow up with your Primary Care Provider.  If you are age 79 or younger, your body mass index should be between 19-25. Your Body mass index is 48.26 kg/m. If this is out of the aformentioned range listed, please consider follow up with your Primary Care Provider.   You have been scheduled for an endoscopy and colonoscopy. Please follow the written instructions given to you at your visit today. Please pick up your prep supplies at the pharmacy within the next 1-3 days. If you use inhalers (even only as needed), please bring them with you on the day of your procedure.  Thank you for choosing me and [facility].  Doug Sou, PA-C

## 2020-05-06 ENCOUNTER — Encounter: Payer: Self-pay | Admitting: Gastroenterology

## 2020-05-06 DIAGNOSIS — K219 Gastro-esophageal reflux disease without esophagitis: Secondary | ICD-10-CM | POA: Insufficient documentation

## 2020-05-06 DIAGNOSIS — R12 Heartburn: Secondary | ICD-10-CM | POA: Insufficient documentation

## 2020-05-06 DIAGNOSIS — R195 Other fecal abnormalities: Secondary | ICD-10-CM | POA: Insufficient documentation

## 2020-06-02 NOTE — Progress Notes (Signed)
Reviewed and agree with documentation and assessment and plan. K. Veena Nandigam , MD   

## 2020-06-24 ENCOUNTER — Telehealth: Payer: Self-pay | Admitting: Gastroenterology

## 2020-06-24 NOTE — Telephone Encounter (Signed)
Good morning,  Patient has procedure scheduled for 06/25/20 at 1:30pm, Dr. Lavon Paganini..  Patient calling to inform she has eaten spring salad mix, since Sat,Sun,Mon.Jean Berry since Mon..  Please advise

## 2020-06-24 NOTE — Telephone Encounter (Signed)
Called patient and told her to come by and pick up sample kit of Plenvu. Patient stated she would come at 4:00pm

## 2020-06-24 NOTE — Telephone Encounter (Signed)
Agree, if she doesn't feel she is having clear BM , she may need additional Miralax bowel prep tonight. Thanks

## 2020-06-24 NOTE — Telephone Encounter (Signed)
Returned pts call.  States she ate salads and popcorn over the past week but she has only had clear liquids today.  Encouraged her to continue with clear liquids and drink plenty today.  She verbalized understanding of this.

## 2020-06-24 NOTE — Telephone Encounter (Signed)
[facility] pharmacy rep called to advise the Plenvu medication is currently on back order they are requesting an alternative for the patient is scheduled for tomorrow.

## 2020-06-25 ENCOUNTER — Other Ambulatory Visit: Payer: Self-pay

## 2020-06-25 ENCOUNTER — Ambulatory Visit (AMBULATORY_SURGERY_CENTER): Payer: 59 | Admitting: Gastroenterology

## 2020-06-25 ENCOUNTER — Encounter: Payer: Self-pay | Admitting: Gastroenterology

## 2020-06-25 ENCOUNTER — Other Ambulatory Visit: Payer: Self-pay | Admitting: Gastroenterology

## 2020-06-25 VITALS — BP 116/75 | HR 88 | Temp 98.2°F | Resp 15 | Ht 66.0 in | Wt 299.0 lb

## 2020-06-25 DIAGNOSIS — K648 Other hemorrhoids: Secondary | ICD-10-CM

## 2020-06-25 DIAGNOSIS — K317 Polyp of stomach and duodenum: Secondary | ICD-10-CM | POA: Diagnosis not present

## 2020-06-25 DIAGNOSIS — R195 Other fecal abnormalities: Secondary | ICD-10-CM

## 2020-06-25 DIAGNOSIS — K219 Gastro-esophageal reflux disease without esophagitis: Secondary | ICD-10-CM

## 2020-06-25 DIAGNOSIS — K5730 Diverticulosis of large intestine without perforation or abscess without bleeding: Secondary | ICD-10-CM

## 2020-06-25 DIAGNOSIS — K2970 Gastritis, unspecified, without bleeding: Secondary | ICD-10-CM | POA: Diagnosis not present

## 2020-06-25 MED ORDER — SODIUM CHLORIDE 0.9 % IV SOLN: 500.0000 mL | Freq: Once | INTRAVENOUS | Status: DC

## 2020-06-25 MED ORDER — SUCRALFATE 1 G PO TABS: 1.0000 g | ORAL_TABLET | Freq: Three times a day (TID) | ORAL | 3 refills | Status: DC

## 2020-06-25 NOTE — Patient Instructions (Signed)
Discharge instructions given. Handouts on Diverticulosis, Hemorrhoids, and Gastritis. No ibuprofen,naproxen,or other non-steroidal anti-inflammatory drugs. Resume previous medications. YOU HAD AN ENDOSCOPIC PROCEDURE TODAY AT THE [facility]:   Refer to the procedure report that was given to you for any specific questions about what was found during the examination.  If the procedure report does not answer your questions, please call your gastroenterologist to clarify.  If you requested that your care partner not be given the details of your procedure findings, then the procedure report has been included in a sealed envelope for you to review at your convenience later.  YOU SHOULD EXPECT: Some feelings of bloating in the abdomen. Passage of more gas than usual.  Walking can help get rid of the air that was put into your GI tract during the procedure and reduce the bloating. If you had a lower endoscopy (such as a colonoscopy or flexible sigmoidoscopy) you may notice spotting of blood in your stool or on the toilet paper. If you underwent a bowel prep for your procedure, you may not have a normal bowel movement for a few days.  Please Note:  You might notice some irritation and congestion in your nose or some drainage.  This is from the oxygen used during your procedure.  There is no need for concern and it should clear up in a day or so.  SYMPTOMS TO REPORT IMMEDIATELY:   Following lower endoscopy (colonoscopy or flexible sigmoidoscopy):  Excessive amounts of blood in the stool  Significant tenderness or worsening of abdominal pains  Swelling of the abdomen that is new, acute  Fever of 100F or higher   Following upper endoscopy (EGD)  Vomiting of blood or coffee ground material  New chest pain or pain under the shoulder blades  Painful or persistently difficult swallowing  New shortness of breath  Fever of 100F or higher  Black, tarry-looking stools  For urgent or emergent  issues, a gastroenterologist can be reached at any hour by calling [phone]. Do not use MyChart messaging for urgent concerns.    DIET:  We do recommend a small meal at first, but then you may proceed to your regular diet.  Drink plenty of fluids but you should avoid alcoholic beverages for 24 hours.  ACTIVITY:  You should plan to take it easy for the rest of today and you should NOT DRIVE or use heavy machinery until tomorrow (because of the sedation medicines used during the test).    FOLLOW UP: Our staff will call the number listed on your records 48-72 hours following your procedure to check on you and address any questions or concerns that you may have regarding the information given to you following your procedure. If we do not reach you, we will leave a message.  We will attempt to reach you two times.  During this call, we will ask if you have developed any symptoms of COVID 19. If you develop any symptoms (ie: fever, flu-like symptoms, shortness of breath, cough etc.) before then, please call [phone].  If you test positive for Covid 19 in the 2 weeks post procedure, please call and report this information to [place].    If any biopsies were taken you will be contacted by phone or by letter within the next 1-3 weeks.  Please call us at [phone] if you have not heard about the biopsies in 3 weeks.    SIGNATURES/CONFIDENTIALITY: You and/or your care partner have signed paperwork which will be entered  into your electronic medical record.  These signatures attest to the fact that that the information above on your After Visit Summary has been reviewed and is understood.  Full responsibility of the confidentiality of this discharge information lies with you and/or your care-partner.

## 2020-06-25 NOTE — Op Note (Signed)
[facility] Patient Name: Jean Berry Procedure Date: 06/25/2020 1:26 PM MRN: [id number] Endoscopist: Napoleon Form , MD Age: 55 Referring MD:  Date of Birth: 11-May-1965 Gender: Female Account #: [id number] Procedure:                Upper GI endoscopy Indications:              Gastrointestinal bleeding of unknown origin,                            Esophageal reflux symptoms that persist despite                            appropriate therapy Medicines:                Monitored Anesthesia Care Procedure:                Pre-Anesthesia Assessment:                           - Prior to the procedure, a History and Physical                            was performed, and patient medications and                            allergies were reviewed. The patient's tolerance of                            previous anesthesia was also reviewed. The risks                            and benefits of the procedure and the sedation                            options and risks were discussed with the patient.                            All questions were answered, and informed consent                            was obtained. Prior Anticoagulants: The patient has                            taken no previous anticoagulant or antiplatelet                            agents. ASA Grade Assessment: III - A patient with                            severe systemic disease. After reviewing the risks                            and benefits, the patient was deemed in  satisfactory condition to undergo the procedure.                           After obtaining informed consent, the endoscope was                            passed under direct vision. Throughout the                            procedure, the patient's blood pressure, pulse, and                            oxygen saturations were monitored continuously. The                            Endoscope was introduced through  the mouth, and                            advanced to the second part of duodenum. The upper                            GI endoscopy was accomplished without difficulty.                            The patient tolerated the procedure well. Scope In: Scope Out: Findings:                 The Z-line was regular and was found 38 cm from the                            incisors.                           The examined esophagus was normal.                           A few 7 to 15 mm pedunculated and sessile polyps                            with stigmata of recent bleeding were found in the                            gastric body. Biopsies were taken with a cold                            forceps for histology.                           Patchy mild inflammation characterized by                            congestion (edema) and erythema was found in the                            entire examined stomach.  Biopsies were taken with a                            cold forceps for Helicobacter pylori testing.                           The examined duodenum was normal. Complications:            No immediate complications. Estimated Blood Loss:     Estimated blood loss was minimal. Impression:               - Z-line regular, 38 cm from the incisors.                           - Normal esophagus.                           - A few gastric polyps. Biopsied.                           - Gastritis. Biopsied.                           - Normal examined duodenum. Recommendation:           - Patient has a contact number available for                            emergencies. The signs and symptoms of potential                            delayed complications were discussed with the                            patient. Return to normal activities tomorrow.                            Written discharge instructions were provided to the                            patient.                           - Resume previous  diet.                           - Continue present medications.                           - Await pathology results.                           - No ibuprofen, naproxen, or other non-steroidal                            anti-inflammatory drugs.                           -  See the other procedure note for documentation of                            additional recommendations. Napoleon Form, MD 06/25/2020 2:17:55 PM This report has been signed electronically.

## 2020-06-25 NOTE — Op Note (Signed)
[facility] Patient Name: Jean Berry Procedure Date: 06/25/2020 1:26 PM MRN: [id number] Endoscopist: Napoleon Form , MD Age: 55 Referring MD:  Date of Birth: March 28, 1966 Gender: Female Account #: [id number] Procedure:                Colonoscopy Indications:              Evaluation of unexplained GI bleeding presenting                            with fecal occult blood, Positive Cologuard test Medicines:                Monitored Anesthesia Care Procedure:                Pre-Anesthesia Assessment:                           - Prior to the procedure, a History and Physical                            was performed, and patient medications and                            allergies were reviewed. The patient's tolerance of                            previous anesthesia was also reviewed. The risks                            and benefits of the procedure and the sedation                            options and risks were discussed with the patient.                            All questions were answered, and informed consent                            was obtained. Prior Anticoagulants: The patient has                            taken no previous anticoagulant or antiplatelet                            agents. ASA Grade Assessment: III - A patient with                            severe systemic disease. After reviewing the risks                            and benefits, the patient was deemed in                            satisfactory condition to undergo the procedure.  After obtaining informed consent, the colonoscope                            was passed under direct vision. Throughout the                            procedure, the patient's blood pressure, pulse, and                            oxygen saturations were monitored continuously. The                            Olympus PCF-[id number] (#[id number]) Colonoscope was                             introduced through the anus and advanced to the the                            cecum, identified by appendiceal orifice and                            ileocecal valve. The colonoscopy was performed                            without difficulty. The patient tolerated the                            procedure well. The quality of the bowel                            preparation was good. The ileocecal valve,                            appendiceal orifice, and rectum were photographed. Scope In: 1:48:20 PM Scope Out: 2:09:06 PM Scope Withdrawal Time: 0 hours 10 minutes 32 seconds  Total Procedure Duration: 0 hours 20 minutes 46 seconds  Findings:                 The perianal and digital rectal examinations were                            normal.                           A few small-mouthed diverticula were found in the                            sigmoid colon.                           Non-bleeding internal hemorrhoids were found during                            retroflexion. The hemorrhoids were medium-sized. Complications:            No immediate complications. Estimated Blood Loss:  Estimated blood loss was minimal. Impression:               - Diverticulosis in the sigmoid colon.                           - Non-bleeding internal hemorrhoids.                           - No specimens collected. Recommendation:           - Patient has a contact number available for                            emergencies. The signs and symptoms of potential                            delayed complications were discussed with the                            patient. Return to normal activities tomorrow.                            Written discharge instructions were provided to the                            patient.                           - Resume previous diet.                           - Continue present medications.                           - Repeat colonoscopy in 10 years for screening                             purposes. Napoleon Form, MD 06/25/2020 2:20:01 PM This report has been signed electronically.

## 2020-06-25 NOTE — Progress Notes (Signed)
To PACU, VSS. Report to [email or web address] 

## 2020-06-25 NOTE — Progress Notes (Signed)
VS by CW  I have reviewed the patient's medical history in detail and updated the computerized patient record.  

## 2020-06-25 NOTE — Progress Notes (Signed)
Called to room to assist during endoscopic procedure.  Patient ID and intended procedure confirmed with present staff. Received instructions for my participation in the procedure from the performing physician.  

## 2020-06-27 ENCOUNTER — Telehealth: Payer: Self-pay | Admitting: *Deleted

## 2020-06-27 ENCOUNTER — Telehealth: Payer: Self-pay

## 2020-06-27 NOTE — Telephone Encounter (Signed)
  Follow up Call-  Call back number 06/25/2020  Post procedure Call Back phone  # [phone]  Permission to leave phone message Yes  Some recent data might be hidden     Patient questions:  Do you have a fever, pain , or abdominal swelling? No. Pain Score  0 *  Have you tolerated food without any problems? Yes.    Have you been able to return to your normal activities? Yes.    Do you have any questions about your discharge instructions: Diet   No. Medications  No. Follow up visit  No.  Do you have questions or concerns about your Care? No.  Actions: * If pain score is 4 or above: No action needed, pain <4.

## 2020-06-27 NOTE — Telephone Encounter (Signed)
Follow up call made. 

## 2020-07-11 ENCOUNTER — Encounter: Payer: Self-pay | Admitting: Gastroenterology

## 2021-03-12 ENCOUNTER — Other Ambulatory Visit: Payer: Self-pay | Admitting: Gastroenterology

## 2021-11-09 ENCOUNTER — Other Ambulatory Visit: Payer: Self-pay | Admitting: Gastroenterology

## 2021-12-11 ENCOUNTER — Ambulatory Visit (INDEPENDENT_AMBULATORY_CARE_PROVIDER_SITE_OTHER): Payer: 59 | Admitting: Acute Care

## 2021-12-11 ENCOUNTER — Encounter: Payer: Self-pay | Admitting: Acute Care

## 2021-12-11 VITALS — BP 138/76 | HR 83 | Temp 98.5°F | Ht 66.0 in | Wt 275.8 lb

## 2021-12-11 DIAGNOSIS — Z6841 Body Mass Index (BMI) 40.0 and over, adult: Secondary | ICD-10-CM | POA: Diagnosis not present

## 2021-12-11 DIAGNOSIS — Z9989 Dependence on other enabling machines and devices: Secondary | ICD-10-CM

## 2021-12-11 DIAGNOSIS — G4733 Obstructive sleep apnea (adult) (pediatric): Secondary | ICD-10-CM

## 2021-12-11 NOTE — Patient Instructions (Signed)
We will place an order for a new CPAP machine.  Please place orders for equipment and mask of choice. Set  Pressure of 16 cm H2O. Continue wearing current CPAP until new machine is delivered.  Continue on CPAP at bedtime. You appear to be benefiting from the treatment  Goal is to wear for at least 6 hours each night for maximal clinical benefit. Continue to work on weight loss, as the link between excess weight  and sleep apnea is well established.   Remember to establish a good bedtime routine, and work on sleep hygiene.  Limit daytime naps , avoid stimulants such as caffeine and nicotine close to bedtime, exercise daily to promote sleep quality, avoid heavy , spicy, fried , or rich foods before bed. Ensure adequate exposure to natural light during the day,establish a relaxing bedtime routine with a pleasant sleep environment ( Bedroom between 60 and 67 degrees, turn off bright lights , TV or device screens screens , consider black out curtains or white noise machines) Do not drive if sleepy. Remember to clean mask, tubing, filter, and reservoir once weekly with soapy water.  Follow up with  Kalev Temme NP   In 10 weeks  or before as needed.  We will refer you to healthy weight and wellness.  We will send in a referral for the PREP exercise program through the [facility].    Call if you need [place] sooner.  Please contact office for sooner follow up if symptoms do not improve or worsen or seek emergency care

## 2021-12-11 NOTE — Progress Notes (Signed)
Reviewed and agree with assessment/plan.   Coralyn Helling, MD [facility] Pulmonary/Critical Care 12/11/2021, 2:55 PM Pager:  [phone]

## 2021-12-11 NOTE — Addendum Note (Signed)
Addended by: Dorisann Frames R on: 12/11/2021 02:20 PM   Modules accepted: Orders

## 2021-12-11 NOTE — Progress Notes (Signed)
History of Present Illness Jean Berry is a 56 y.o. female with history of OSA previously on CPAP, with DM, GERD, Thyroid disease, back pain and morbid obesity. She is here for sleep consult.   12/11/2021 Pt.presents for sleep consult. She has been  using an old CPAP machine from 2010. She has brought this with her today.Down Load reveals 100% compliance , no break in therapy. Good control on current settings of set pressure of 16. AHI is 1.0 on current settings. Her machine is very old and has multiple leaks. She is taping it together. She needs a new machine and equipment.She currently has very few symptoms with CPAP therapy.      Sleep questionnaire Symptoms-  Currently using an old CPAP machine. Diabetes, Fatigue, Morbid Obesity, Morning Headaches, Non-refreshing Sleep, Snoring, Witnesses Apnea / Gasping During Sleep Prior sleep study-  Yes 03/07/2022>> Mild OSA  Bedtime- 10 PM -Midnight  Time to fall asleep- 20 minutes Nocturnal awakenings- once Out of bed/start of day- 5 am - 5:30 am  Weight changes- Weight loss of about 15 pounds Do you operate heavy machinery- No Do you currently wear CPAP- Yes Do you current wear oxygen- No Epworth-  2>> on CPAP  Test Results: 12/11/2021 CPAP Down Load      03/2020 In Lab Sleep Study  IMPRESSIONS - Mild obstructive sleep apnea occurred during this study (AHI = 8.6/h). - No central sleep apnea occurred during this study . - The patient had minimal oxygen desaturation during the study (Min O2 = 79.0%) Mean O2 sat 94.6%. - The patient snored with moderate snoring volume. - EKG findings include PVCs. - No periodic limb movements of sleep occurred during the study.   DIAGNOSIS - Obstructive Sleep Apnea (G47.33   RECOMMENDATIONS - Treatment for mild OSA is directed at symptoms. Conservative measures may include observation, weight loss and sleep position off back. Other options, including CPAP titration, a fitted oral appliance, or  ENT evaluation, would be based on clinical judgment. - Be careful with alcohol, sedatives and other CNS depressants that may worsen sleep apnea and disrupt normal sleep architecture. - Sleep hygiene should be reviewed to assess factors that may improve sleep quality. - Weight management and regular exercise should be initiated or continued if appropriate.      12/11/2021   11:00 AM  Results of the Epworth flowsheet  Sitting and reading 0  Watching TV 2  Sitting, inactive in a public place (e.g. a theatre or a meeting) 0  As a passenger in a car for an hour without a break 0  Lying down to rest in the afternoon when circumstances permit 0  Sitting and talking to someone 0  Sitting quietly after a lunch without alcohol 0  In a car, while stopped for a few minutes in traffic 0  Total score 2          Latest Ref Rng & Units 10/31/2017    6:03 AM 10/30/2017    6:30 AM 10/29/2017    3:00 AM  CBC  WBC 4.0 - 10.5 K/uL 5.5  6.5  8.2   Hemoglobin 12.0 - 15.0 g/dL 10.5  9.8  10.9   Hematocrit 36.0 - 46.0 % 32.4  30.5  32.8   Platelets 150 - 400 K/uL 295  268  282        Latest Ref Rng & Units 08/18/2018    3:40 PM 12/15/2017    8:55 AM 10/31/2017    6:03 AM  BMP  Glucose 65 - 99 mg/dL 104  151  268   BUN 7 - 25 mg/dL _0 Creatinine 0.50 - 1.05 mg/dL 0.78  0.76  0.76   BUN/Creat Ratio 6 - 22 (calc) NOT APPLICABLE     Sodium 409 - 146 mmol/L 140  141  140   Potassium 3.5 - 5.3 mmol/L 4.0  4.5  3.7   Chloride 98 - 110 mmol/L 103  105  105   CO2 20 - 32 mmol/L _1 Calcium 8.6 - 10.4 mg/dL 9.3  9.7  8.6     BNP    Component Value Date/Time   BNP 11.8 10/26/2017 2158    ProBNP No results found for: "PROBNP"  PFT No results found for: "FEV1PRE", "FEV1POST", "FVCPRE", "FVCPOST", "TLC", "DLCOUNC", "PREFEV1FVCRT", "PSTFEV1FVCRT"  No results found.   Past medical hx Past Medical History:  Diagnosis Date   Arthritis    Diabetes mellitus without complication  ([place])    Lower extremity edema 11/18/2016   Osteoarthritis of back    Palpitations    Shortness of breath 11/18/2016   Thyroid disease    left lobect approx 2012--benign per pt     Social History   Tobacco Use   Smoking status: Never   Smokeless tobacco: Never  Vaping Use   Vaping Use: Never used  Substance Use Topics   Alcohol use: No   Drug use: No    Ms.Danker reports that she has never smoked. She has never used smokeless tobacco. She reports that she does not drink alcohol and does not use drugs.  Tobacco Cessation: Never smoker   Past surgical hx, Family hx, Social hx all reviewed.  Current Outpatient Medications on File Prior to Visit  Medication Sig   acetaminophen (TYLENOL) 500 MG tablet Take 1,000 mg by mouth every 6 (six) hours as needed for headache.   Blood Glucose Monitoring Suppl (ONETOUCH VERIO FLEX SYSTEM) w/Device KIT 1 Device by Does not apply route daily. Please dispense appropriate strips and lancets for device to check glucose up to TID.   famotidine (PEPCID) 20 MG tablet Take 20 mg by mouth daily as needed.   glucose blood test strip Onetouch verio flex meter. Check blood glucose TID.   Insulin Glargine (LANTUS SOLOSTAR) 100 UNIT/ML Solostar Pen Inject 30 Units into the skin daily.   Insulin Pen Needle (PEN NEEDLES) 31G X 6 MM MISC Use to inject insulin up to TID   JARDIANCE 25 MG TABS tablet Take 25 mg by mouth daily.   Lancets Misc. MISC Onetouch verio flex. Check blood glucose TID.   lisinopril (ZESTRIL) 2.5 MG tablet Take 2.5 mg by mouth daily.   loratadine (CLARITIN) 10 MG tablet Take 1 tablet (10 mg total) by mouth daily.   metFORMIN (GLUCOPHAGE-XR) 500 MG 24 hr tablet Take 1 tablet by mouth 2 (two) times daily.   metoprolol tartrate (LOPRESSOR) 25 MG tablet Take 1 tablet (25 mg total) by mouth 2 (two) times daily.   montelukast (SINGULAIR) 10 MG tablet Take 10 mg by mouth daily as needed.   Multiple Vitamin (MULTIVITAMIN WITH MINERALS) TABS  tablet Take 1 tablet by mouth daily.   pantoprazole (PROTONIX) 40 MG tablet Take 1 tablet (40 mg total) by mouth daily.   sucralfate (CARAFATE) 1 g tablet TAKE 1 TABLET BY MOUTH 4 TIMES DAILY WITH MEALS AND AT bedtime.   TRULICITY 1.5 WJ/1.9JY SOPN Inject 3 mg into the  skin once a week.   No current facility-administered medications on file prior to visit.     Allergies  Allergen Reactions   Bee Venom Anaphylaxis   Latex Rash    Review Of Systems:  Constitutional:   No  weight loss, night sweats,  Fevers, chills, fatigue, or  lassitude.  HEENT:   No headaches,  Difficulty swallowing,  Tooth/dental problems, or  Sore throat,                No sneezing, itching, ear ache, nasal congestion, post nasal drip,   CV:  No chest pain,  Orthopnea, PND, swelling in lower extremities, anasarca, dizziness, palpitations, syncope.   GI  No heartburn, indigestion, abdominal pain, nausea, vomiting, diarrhea, change in bowel habits, loss of appetite, bloody stools.   Resp: No shortness of breath with exertion or at rest.  No excess mucus, no productive cough,  No non-productive cough,  No coughing up of blood.  No change in color of mucus.  No wheezing.  No chest wall deformity  Skin: no rash or lesions.  GU: no dysuria, change in color of urine, no urgency or frequency.  No flank pain, no hematuria   MS:  No joint pain or swelling.  No decreased range of motion.  No back pain.  Psych:  No change in mood or affect. No depression or anxiety.  No memory loss.   Vital Signs BP 138/76 (BP Location: Left Arm, Patient Position: Sitting, Cuff Size: Large)   Pulse 83   Temp 98.5 F (36.9 C) (Oral)   Ht 5' 6" (1.676 m)   Wt 275 lb 12.8 oz (125.1 kg)   SpO2 97%   BMI 44.52 kg/m    Physical Exam:  General- No distress,  A&Ox3, pleasasant ENT: No sinus tenderness, TM clear, pale nasal mucosa, no oral exudate,no post nasal drip, no LAN Cardiac: S1, S2, regular rate and rhythm, no murmur Chest:  No wheeze/ rales/ dullness; no accessory muscle use, no nasal flaring, no sternal retractions Abd.: Soft Non-tender, ND, BS +, Body mass index is 44.52 kg/m.  Ext: No clubbing cyanosis, edema Neuro:  normal strength, MAE x 4, A&O x 3 Skin: No rashes, warm and dry, no lesions  Psych: normal mood and behavior   Assessment/Plan OSA Needs new CPAP machine and equipment No Break in therapy Great compliance and control>> AHI of 1 on set pressure of 16 cm H2O Plan  We will place an order for a new CPAP machine.  Please place orders for equipment and mask of choice. Set  Pressure of 16 cm H2O. Continue wearing current CPAP until new machine is delivered.  Continue on CPAP at bedtime. You appear to be benefiting from the treatment  Goal is to wear for at least 6 hours each night for maximal clinical benefit. Continue to work on weight loss, as the link between excess weight  and sleep apnea is well established.   Remember to establish a good bedtime routine, and work on sleep hygiene.  Limit daytime naps , avoid stimulants such as caffeine and nicotine close to bedtime, exercise daily to promote sleep quality, avoid heavy , spicy, fried , or rich foods before bed. Ensure adequate exposure to natural light during the day,establish a relaxing bedtime routine with a pleasant sleep environment ( Bedroom between 60 and 67 degrees, turn off bright lights , TV or device screens screens , consider black out curtains or white noise machines) Do not drive if sleepy. Remember  to clean mask, tubing, filter, and reservoir once weekly with soapy water.  Follow up with  Sarah NP   In 10 weeks  or before as needed.  We will refer you to healthy weight and wellness.  We will send in a referral for the PREP exercise program through the [facility].    Call if you need [place] sooner.  Please contact office for sooner follow up if symptoms do not improve or worsen or seek emergency care   I spent 40 minutes dedicated to the  care of this patient on the date of this encounter to include pre-visit review of records, face-to-face time with the patient discussing conditions above, post visit ordering of testing, clinical documentation with the electronic health record, making appropriate referrals as documented, and communicating necessary information to the patient's healthcare team.    Magdalen Spatz, NP 12/11/2021  1:49 PM

## 2021-12-14 ENCOUNTER — Telehealth: Payer: Self-pay | Admitting: *Deleted

## 2021-12-14 NOTE — Telephone Encounter (Signed)
Called patient in regards to PREP Class referral. Unable to leave a voice message on her mobile phone.

## 2022-01-25 ENCOUNTER — Other Ambulatory Visit: Payer: Self-pay | Admitting: Gastroenterology

## 2022-01-28 ENCOUNTER — Telehealth: Payer: Self-pay | Admitting: Gastroenterology

## 2022-01-28 ENCOUNTER — Other Ambulatory Visit: Payer: Self-pay

## 2022-01-28 MED ORDER — SUCRALFATE 1 G PO TABS: 1.0000 g | ORAL_TABLET | Freq: Every day | ORAL | 0 refills | Status: AC

## 2022-01-28 NOTE — Telephone Encounter (Signed)
Refill was sent to patients pharmacy for 30 days. Patient will need an appointment for further refills

## 2022-01-28 NOTE — Telephone Encounter (Signed)
Inbound call from pharmacy trying to refill medication Sucralfate for patient. Please advise.  Thank you

## 2022-02-01 ENCOUNTER — Encounter (INDEPENDENT_AMBULATORY_CARE_PROVIDER_SITE_OTHER): Payer: Self-pay

## 2022-02-09 ENCOUNTER — Ambulatory Visit: Payer: 59 | Admitting: Acute Care

## 2022-02-19 ENCOUNTER — Ambulatory Visit: Payer: 59 | Admitting: Adult Health

## 2022-02-23 ENCOUNTER — Ambulatory Visit: Payer: 59 | Admitting: Acute Care

## 2022-03-01 ENCOUNTER — Encounter: Payer: Self-pay | Admitting: Adult Health

## 2022-03-01 ENCOUNTER — Ambulatory Visit: Payer: 59 | Admitting: Adult Health

## 2022-03-01 VITALS — BP 100/60 | HR 90 | Temp 98.1°F | Ht 66.0 in | Wt 278.4 lb

## 2022-03-01 DIAGNOSIS — G4733 Obstructive sleep apnea (adult) (pediatric): Secondary | ICD-10-CM

## 2022-03-01 NOTE — Patient Instructions (Signed)
Change to Dream wear full face mask.  Wear CPAP all night long  Do not drive if sleepy  Work on healthy weight loss  Follow up in 1 year Dr. Wynona Neat or Alexandria Lodge NP and As needed

## 2022-03-01 NOTE — Progress Notes (Signed)
_0  ID: Jean Berry, female    DOB: Dec 25, 1965, 56 y.o.   MRN: [id number]  Chief Complaint  Patient presents with   Follow-up    Referring provider: Mittie Bodo  HPI: 56 year old female seen for sleep consult December 11, 2021 to establish for sleep apnea  TEST/EVENTS :  03/07/20 NPSG 8.6/hr , SpO2 79%.   03/01/2022 Follow up ; OSA  Patient returns for 7-monthfollow-up.  Patient has underlying sleep apnea.  She was seen in September to establish for sleep apnea.  She recently got a new CPAP machine.  Says it is working well.  She wears her CPAP every single night.  Says she cannot sleep without it.  Feels that she benefits with CPAP with decreased daytime sleepiness.  CPAP download shows excellent compliance with 100% usage.  Daily average usage at 6 hours.  Patient is on CPAP 16 cm H2O.  AHI 0.7. Patient does use a full facemask.  Does complain that is causing some irritation on the left side of her nose. Feels like the pressure is lighter on the new machine.   Allergies  Allergen Reactions   Bee Venom Anaphylaxis   Latex Rash    Immunization History  Administered Date(s) Administered   COVID-19, mRNA, vaccine(Comirnaty)12 years and older 02/19/2022   Influenza Inj Mdck Quad Pf 03/17/2021, 02/19/2022   Influenza,inj,Quad PF,6+ Mos 01/09/2017   PFIZER(Purple Top)SARS-COV-2 Vaccination 11/02/2019, 11/23/2019   Pfizer Covid-19 Vaccine Bivalent Booster 133yr& up 12/26/2020   Tdap 11/26/2021    Past Medical History:  Diagnosis Date   Arthritis    Diabetes mellitus without complication ([place]   Lower extremity edema 11/18/2016   Osteoarthritis of back    Palpitations    Shortness of breath 11/18/2016   Thyroid disease    left lobect approx 2012--benign per pt    Tobacco History: Social History   Tobacco Use  Smoking Status Never  Smokeless Tobacco Never   Counseling given: Not Answered   Outpatient Medications Prior to Visit  Medication Sig  Dispense Refill   acetaminophen (TYLENOL) 500 MG tablet Take 1,000 mg by mouth every 6 (six) hours as needed for headache.     Blood Glucose Monitoring Suppl (ONETOUCH VERIO FLEX SYSTEM) w/Device KIT 1 Device by Does not apply route daily. Please dispense appropriate strips and lancets for device to check glucose up to TID. 1 kit 0   famotidine (PEPCID) 20 MG tablet Take 20 mg by mouth daily as needed.     glucose blood test strip Onetouch verio flex meter. Check blood glucose TID. 100 each 12   Insulin Glargine (LANTUS SOLOSTAR) 100 UNIT/ML Solostar Pen Inject 30 Units into the skin daily. 15 mL 0   Insulin Pen Needle (PEN NEEDLES) 31G X 6 MM MISC Use to inject insulin up to TID 100 each 11   JARDIANCE 25 MG TABS tablet Take 25 mg by mouth daily.     Lancets Misc. MISC Onetouch verio flex. Check blood glucose TID. 100 Units 0   lisinopril (ZESTRIL) 2.5 MG tablet Take 2.5 mg by mouth daily.     loratadine (CLARITIN) 10 MG tablet Take 1 tablet (10 mg total) by mouth daily. 90 tablet 1   metFORMIN (GLUCOPHAGE-XR) 500 MG 24 hr tablet Take 1 tablet by mouth 2 (two) times daily.     metoprolol tartrate (LOPRESSOR) 25 MG tablet Take 1 tablet (25 mg total) by mouth 2 (two) times daily. 60 tablet 0   montelukast (SINGULAIR)  10 MG tablet Take 10 mg by mouth daily as needed.     Multiple Vitamin (MULTIVITAMIN WITH MINERALS) TABS tablet Take 1 tablet by mouth daily.     pantoprazole (PROTONIX) 40 MG tablet Take 1 tablet (40 mg total) by mouth daily. 30 tablet 0   sucralfate (CARAFATE) 1 g tablet Take 1 tablet (1 g total) by mouth daily. 30 tablet 0   TRULICITY 1.5 UJ/8.1XB SOPN Inject 3 mg into the skin once a week.     No facility-administered medications prior to visit.     Review of Systems:   Constitutional:   No  weight loss, night sweats,  Fevers, chills, fatigue, or  lassitude.  HEENT:   No headaches,  Difficulty swallowing,  Tooth/dental problems, or  Sore throat,                No sneezing,  itching, ear ache, nasal congestion, post nasal drip,   CV:  No chest pain,  Orthopnea, PND, swelling in lower extremities, anasarca, dizziness, palpitations, syncope.   GI  No heartburn, indigestion, abdominal pain, nausea, vomiting, diarrhea, change in bowel habits, loss of appetite, bloody stools.   Resp: No shortness of breath with exertion or at rest.  No excess mucus, no productive cough,  No non-productive cough,  No coughing up of blood.  No change in color of mucus.  No wheezing.  No chest wall deformity  Skin: no rash or lesions.  GU: no dysuria, change in color of urine, no urgency or frequency.  No flank pain, no hematuria   MS:  No joint pain or swelling.  No decreased range of motion.  No back pain.    Physical Exam  BP 100/60 (BP Location: Left Arm, Patient Position: Sitting, Cuff Size: Large)   Pulse 90   Temp 98.1 F (36.7 C) (Oral)   Ht _0  (1.676 m)   Wt 278 lb 6.4 oz (126.3 kg)   SpO2 96%   BMI 44.93 kg/m   GEN: A/Ox3; pleasant , NAD, well nourished    HEENT:  [place]/AT,  , NOSE-clear, THROAT-clear, no lesions, no postnasal drip or exudate noted. Class 3-4 MP airway   NECK:  Supple w/ fair ROM; no JVD; normal carotid impulses w/o bruits; no thyromegaly or nodules palpated; no lymphadenopathy.    RESP  Clear  P & A; w/o, wheezes/ rales/ or rhonchi. no accessory muscle use, no dullness to percussion  CARD:  RRR, no m/r/g, no peripheral edema, pulses intact, no cyanosis or clubbing.  GI:   Soft & nt; nml bowel sounds; no organomegaly or masses detected.   Musco: Warm bil, no deformities or joint swelling noted.   Neuro: alert, no focal deficits noted.    Skin: Warm, no lesions or rashes, small flat hyperpigmentation along left lateral nose     Lab Results:  CBC   BNP   ProBNP No results found for: "PROBNP"  Imaging: No results found.        No data to display          No results found for: "NITRICOXIDE"      Assessment &  Plan:   OSA on CPAP Excellent control compliance on nocturnal CPAP We will change to DreamWear fullface to help with skin irritation from the current mask. If skin irritation does not resolve will need to refer to dermatology  Plan  Patient Instructions  Change to Dream wear full face mask.  Wear CPAP all night long  Do  not drive if sleepy  Work on healthy weight loss  Follow up in 1 year Dr. Ander Slade or Elie Confer NP and As needed         Rexene Edison, NP 03/01/2022

## 2022-03-01 NOTE — Assessment & Plan Note (Signed)
Excellent control compliance on nocturnal CPAP We will change to DreamWear fullface to help with skin irritation from the current mask. If skin irritation does not resolve will need to refer to dermatology  Plan  Patient Instructions  Change to Dream wear full face mask.  Wear CPAP all night long  Do not drive if sleepy  Work on healthy weight loss  Follow up in 1 year Dr. Wynona Neat or Alexandria Lodge NP and As needed

## 2022-03-01 NOTE — Addendum Note (Signed)
Addended by: Delrae Rend on: 03/01/2022 04:10 PM   Modules accepted: Orders

## 2022-04-29 ENCOUNTER — Encounter (INDEPENDENT_AMBULATORY_CARE_PROVIDER_SITE_OTHER): Payer: 59 | Admitting: Family Medicine

## 2023-04-14 ENCOUNTER — Ambulatory Visit: Payer: 59 | Admitting: Adult Health

## 2023-04-14 ENCOUNTER — Encounter: Payer: Self-pay | Admitting: Adult Health

## 2023-04-14 VITALS — BP 110/60 | HR 91 | Ht 66.0 in | Wt 290.0 lb

## 2023-04-14 DIAGNOSIS — G4733 Obstructive sleep apnea (adult) (pediatric): Secondary | ICD-10-CM | POA: Diagnosis not present

## 2023-04-14 DIAGNOSIS — Z6841 Body Mass Index (BMI) 40.0 and over, adult: Secondary | ICD-10-CM | POA: Diagnosis not present

## 2023-04-14 NOTE — Progress Notes (Signed)
 @Patient  ID: Jean Berry, female    DOB: 11/07/1965, 58 y.o.   MRN: [id number]  Chief Complaint  Patient presents with   Follow-up    Referring provider: Allen Lauraine LITTIE DEVONNA  HPI: 58 year old female seen for sleep consult December 11, 2021 to establish for sleep apnea on nocturnal CPAP  TEST/EVENTS :  03/07/20 NPSG 8.6/hr , SpO2 79%.  DME-[facility]  04/14/2023 Follow up : OSA  Patient presents for a 1 year follow-up.  Patient has underlying sleep apnea.  She is on nocturnal CPAP.  Says she is doing well on CPAP.  Feels rested with decreased daytime sleepiness.  She uses a fullface mask. CPAP download shows excellent compliance with 100% usage.  Daily average usage at 6 hours.  Patient is on 16 cm H2O..  Does complain that machine has been occasionally turning on and off in the middle the night.  She recently changed her fullface mask to a large size and feels like it is too big.  Download does show mask leaks. She does feel that she is benefiting from CPAP with decreased daytime sleepiness.    Allergies  Allergen Reactions   Bee Venom Anaphylaxis   Latex Rash    Immunization History  Administered Date(s) Administered   Influenza Inj Mdck Quad Pf 03/17/2021, 02/19/2022   Influenza, Mdck, Trivalent,PF 6+ MOS(egg free) 02/17/2023   Influenza,inj,Quad PF,6+ Mos 01/09/2017   PFIZER(Purple Top)SARS-COV-2 Vaccination 11/02/2019, 11/23/2019   PNEUMOCOCCAL CONJUGATE-20 03/23/2023   Pfizer Covid-19 Vaccine Bivalent Booster 26yrs & up 12/26/2020   Pfizer(Comirnaty)Fall Seasonal Vaccine 12 years and older 02/19/2022, 02/17/2023   Tdap 11/26/2021    Past Medical History:  Diagnosis Date   Arthritis    Diabetes mellitus without complication (HCC)    Lower extremity edema 11/18/2016   Osteoarthritis of back    Palpitations    Shortness of breath 11/18/2016   Thyroid  disease    left lobect approx 2012--benign per pt    Tobacco History: Social History   Tobacco Use  Smoking  Status Never  Smokeless Tobacco Never   Counseling given: Not Answered   Outpatient Medications Prior to Visit  Medication Sig Dispense Refill   acetaminophen  (TYLENOL ) 500 MG tablet Take 1,000 mg by mouth every 6 (six) hours as needed for headache.     Blood Glucose Monitoring Suppl (ONETOUCH VERIO FLEX SYSTEM) w/Device KIT 1 Device by Does not apply route daily. Please dispense appropriate strips and lancets for device to check glucose up to TID. 1 kit 0   famotidine (PEPCID) 20 MG tablet Take 20 mg by mouth daily as needed.     glucose blood test strip Onetouch verio flex meter. Check blood glucose TID. 100 each 12   Insulin  Glargine (LANTUS  SOLOSTAR) 100 UNIT/ML Solostar Pen Inject 30 Units into the skin daily. 15 mL 0   Insulin  Pen Needle (PEN NEEDLES) 31G X 6 MM MISC Use to inject insulin  up to TID 100 each 11   JARDIANCE 25 MG TABS tablet Take 25 mg by mouth daily.     Lancets Misc. MISC Onetouch verio flex. Check blood glucose TID. 100 Units 0   lisinopril (ZESTRIL) 2.5 MG tablet Take 2.5 mg by mouth daily.     loratadine  (CLARITIN ) 10 MG tablet Take 1 tablet (10 mg total) by mouth daily. 90 tablet 1   metFORMIN  (GLUCOPHAGE -XR) 500 MG 24 hr tablet Take 1 tablet by mouth 2 (two) times daily.     metoprolol  tartrate (LOPRESSOR ) 25 MG tablet Take 1  tablet (25 mg total) by mouth 2 (two) times daily. 60 tablet 0   montelukast (SINGULAIR) 10 MG tablet Take 10 mg by mouth daily as needed.     Multiple Vitamin (MULTIVITAMIN WITH MINERALS) TABS tablet Take 1 tablet by mouth daily.     pantoprazole  (PROTONIX ) 40 MG tablet Take 1 tablet (40 mg total) by mouth daily. 30 tablet 0   sucralfate  (CARAFATE ) 1 g tablet Take 1 tablet (1 g total) by mouth daily. 30 tablet 0   TRULICITY  1.5 MG/0.5ML SOPN Inject 3 mg into the skin once a week.     No facility-administered medications prior to visit.     Review of Systems:   Constitutional:   No  weight loss, night sweats,  Fevers, chills, fatigue,  or  lassitude.  HEENT:   No headaches,  Difficulty swallowing,  Tooth/dental problems, or  Sore throat,                No sneezing, itching, ear ache, nasal congestion, post nasal drip,   CV:  No chest pain,  Orthopnea, PND, swelling in lower extremities, anasarca, dizziness, palpitations, syncope.   GI  No heartburn, indigestion, abdominal pain, nausea, vomiting, diarrhea, change in bowel habits, loss of appetite, bloody stools.   Resp: No shortness of breath with exertion or at rest.  No excess mucus, no productive cough,  No non-productive cough,  No coughing up of blood.  No change in color of mucus.  No wheezing.  No chest wall deformity  Skin: no rash or lesions.  GU: no dysuria, change in color of urine, no urgency or frequency.  No flank pain, no hematuria   MS:  No joint pain or swelling.  No decreased range of motion.  No back pain.    Physical Exam  BP 110/60 (BP Location: Left Arm, Cuff Size: Large)   Pulse 91   Ht 5' 6 (1.676 m)   Wt 290 lb (131.5 kg)   SpO2 97%   BMI 46.81 kg/m   GEN: A/Ox3; pleasant , NAD, well nourished    HEENT:  [place]/AT,  NOSE-clear, THROAT-clear, no lesions, no postnasal drip or exudate noted.  Class III MP airway  NECK:  Supple w/ fair ROM; no JVD; normal carotid impulses w/o bruits; no thyromegaly or nodules palpated; no lymphadenopathy.    RESP  Clear  P & A; w/o, wheezes/ rales/ or rhonchi. no accessory muscle use, no dullness to percussion  CARD:  RRR, no m/r/g, no peripheral edema, pulses intact, no cyanosis or clubbing.  GI:   Soft & nt; nml bowel sounds; no organomegaly or masses detected.   Musco: Warm bil, no deformities or joint swelling noted.   Neuro: alert, no focal deficits noted.    Skin: Warm, no lesions or rashes    Lab Results:    BMET    ProBNP No results found for: PROBNP  Imaging: No results found.  Administration History     None           No data to display          No results  found for: NITRICOXIDE      Assessment & Plan:   OSA on CPAP Excellent control compliance on nocturnal CPAP.  Patient is instructed to contact her DME company for new mask sizing and service CPAP machine.  She is to continue on CPAP at bedtime.  Plan  Patient Instructions  Mask fitting with DME  CPAP Check with DME .  Wear CPAP all night long Keep up good work .   Do not drive if sleepy  Work on healthy weight loss  Follow up in 1 year Dr. Neda or Leinaala Catanese NP and As needed      BMI 45.0-49.9, adult [facility]) Healthy weight loss discussed     Madelin Stank, NP 04/14/2023

## 2023-04-14 NOTE — Assessment & Plan Note (Signed)
 Healthy weight loss discussed

## 2023-04-14 NOTE — Patient Instructions (Signed)
 Mask fitting with DME  CPAP Check with DME .  Wear CPAP all night long Keep up good work .   Do not drive if sleepy  Work on healthy weight loss  Follow up in 1 year Dr. Wynona Neat or Yarimar Lavis NP and As needed

## 2023-04-14 NOTE — Assessment & Plan Note (Signed)
 Excellent control compliance on nocturnal CPAP.  Patient is instructed to contact her DME company for new mask sizing and service CPAP machine.  She is to continue on CPAP at bedtime.  Plan   Patient Instructions  Mask fitting with DME  CPAP Check with DME .  Wear CPAP all night long Keep up good work .   Do not drive if sleepy  Work on healthy weight loss  Follow up in 1 year Dr. Neda or Effa Yarrow NP and As needed

## 2023-08-04 ENCOUNTER — Telehealth: Payer: Self-pay | Admitting: *Deleted

## 2023-08-04 DIAGNOSIS — G4733 Obstructive sleep apnea (adult) (pediatric): Secondary | ICD-10-CM

## 2023-08-04 NOTE — Telephone Encounter (Signed)
 Copied from CRM [id number]. Topic: Clinical - Prescription Issue >> Aug 04, 2023  2:03 PM Whitney O wrote: Reason for CRM: patient is calling cause she is needing new supplies to the cpap machine tried to do it herself and the company  said she will need a new script for supplies. Nestor Banter number [phone] patient number patient would like a call to know that the script has been faxed over  I have placed referral for CPAP supplies with [facility]  [facility] can you please advise once order has been faxed since the pt asked to be updated? Thanks!

## 2023-08-05 NOTE — Telephone Encounter (Signed)
 For sure just waiting on signature from the provider and it will be sent.
